# Patient Record
Sex: Female | Born: 1985
Health system: Southern US, Community
[De-identification: ages and names within clinical notes are randomized; demographics above are authoritative.]

## PROBLEM LIST (undated history)

## (undated) DIAGNOSIS — L709 Acne, unspecified: Secondary | ICD-10-CM

## (undated) DIAGNOSIS — E785 Hyperlipidemia, unspecified: Secondary | ICD-10-CM

## (undated) DIAGNOSIS — Z87440 Personal history of urinary (tract) infections: Secondary | ICD-10-CM

## (undated) DIAGNOSIS — Z8619 Personal history of other infectious and parasitic diseases: Secondary | ICD-10-CM

## (undated) DIAGNOSIS — M545 Low back pain: Secondary | ICD-10-CM

## (undated) DIAGNOSIS — T7840XA Allergy, unspecified, initial encounter: Secondary | ICD-10-CM

## (undated) DIAGNOSIS — Z6841 Body Mass Index (BMI) 40.0 and over, adult: Secondary | ICD-10-CM

## (undated) HISTORY — DX: Personal history of urinary (tract) infections: Z87.440

## (undated) HISTORY — DX: Hyperlipidemia, unspecified: E78.5

## (undated) HISTORY — DX: Body Mass Index (BMI) 40.0 and over, adult: Z684

## (undated) HISTORY — DX: Morbid (severe) obesity due to excess calories: E66.01

## (undated) HISTORY — DX: Personal history of other infectious and parasitic diseases: Z86.19

## (undated) HISTORY — PX: ADENOIDECTOMY: SUR15

## (undated) HISTORY — DX: Low back pain: M54.5

## (undated) HISTORY — DX: Acne, unspecified: L70.9

## (undated) HISTORY — DX: Allergy, unspecified, initial encounter: T78.40XA

---

## 2015-01-20 DIAGNOSIS — M545 Low back pain, unspecified: Secondary | ICD-10-CM

## 2015-01-20 HISTORY — DX: Low back pain, unspecified: M54.50

## 2016-08-12 ENCOUNTER — Ambulatory Visit (INDEPENDENT_AMBULATORY_CARE_PROVIDER_SITE_OTHER): Payer: BLUE CROSS/BLUE SHIELD | Admitting: Family

## 2016-08-12 ENCOUNTER — Encounter: Payer: Self-pay | Admitting: Family

## 2016-08-12 ENCOUNTER — Telehealth: Payer: Self-pay | Admitting: Family

## 2016-08-12 VITALS — BP 137/77 | HR 88 | Temp 98.5°F | Resp 16 | Ht 65.0 in | Wt 266.6 lb

## 2016-08-12 DIAGNOSIS — Z0000 Encounter for general adult medical examination without abnormal findings: Secondary | ICD-10-CM | POA: Diagnosis not present

## 2016-08-12 DIAGNOSIS — Z6841 Body Mass Index (BMI) 40.0 and over, adult: Secondary | ICD-10-CM | POA: Diagnosis not present

## 2016-08-12 LAB — URINALYSIS, ROUTINE W REFLEX MICROSCOPIC
Bilirubin Urine: NEGATIVE
Ketones, ur: NEGATIVE
Nitrite: POSITIVE — AB
PH: 5.5 (ref 5.0–8.0)
Specific Gravity, Urine: 1.025 (ref 1.000–1.030)
TOTAL PROTEIN, URINE-UPE24: NEGATIVE
URINE GLUCOSE: NEGATIVE
UROBILINOGEN UA: 0.2 (ref 0.0–1.0)

## 2016-08-12 LAB — BASIC METABOLIC PANEL
BUN: 15 mg/dL (ref 6–23)
CALCIUM: 9.6 mg/dL (ref 8.4–10.5)
CO2: 27 meq/L (ref 19–32)
CREATININE: 0.77 mg/dL (ref 0.40–1.20)
Chloride: 107 mEq/L (ref 96–112)
GFR: 92.84 mL/min (ref >60.00)
Glucose, Bld: 91 mg/dL (ref 70–99)
Potassium: 3.9 mEq/L (ref 3.5–5.1)
Sodium: 139 mEq/L (ref 135–145)

## 2016-08-12 LAB — LIPID PANEL
CHOL/HDL RATIO: 4
Cholesterol: 210 mg/dL — ABNORMAL HIGH (ref 0–200)
HDL: 51.7 mg/dL (ref >39.00)
LDL CALC: 123 mg/dL — AB (ref 0–99)
NONHDL: 158.66
TRIGLYCERIDES: 179 mg/dL — AB (ref 0.0–149.0)
VLDL: 35.8 mg/dL (ref 0.0–40.0)

## 2016-08-12 LAB — CBC WITH DIFFERENTIAL/PLATELET
BASOS ABS: 0 10*3/uL (ref 0.0–0.1)
Basophils Relative: 0.3 % (ref 0.0–3.0)
EOS ABS: 0.1 10*3/uL (ref 0.0–0.7)
Eosinophils Relative: 1.7 % (ref 0.0–5.0)
HEMATOCRIT: 41.6 % (ref 36.0–46.0)
Hemoglobin: 13.6 g/dL (ref 12.0–15.0)
LYMPHS PCT: 39 % (ref 12.0–46.0)
Lymphs Abs: 2.2 10*3/uL (ref 0.7–4.0)
MCHC: 32.6 g/dL (ref 30.0–36.0)
MCV: 92.2 fl (ref 78.0–100.0)
MONO ABS: 0.6 10*3/uL (ref 0.1–1.0)
Monocytes Relative: 9.6 % (ref 3.0–12.0)
NEUTROS ABS: 2.8 10*3/uL (ref 1.4–7.7)
Neutrophils Relative %: 49.4 % (ref 43.0–77.0)
PLATELETS: 260 10*3/uL (ref 150.0–400.0)
RBC: 4.51 Mil/uL (ref 3.87–5.11)
RDW: 13.4 % (ref 11.5–15.5)
WBC: 5.7 10*3/uL (ref 4.0–10.5)

## 2016-08-12 LAB — HEPATIC FUNCTION PANEL
ALK PHOS: 58 U/L (ref 39–117)
ALT: 32 U/L (ref 0–35)
AST: 23 U/L (ref 0–37)
Albumin: 4 g/dL (ref 3.5–5.2)
BILIRUBIN DIRECT: 0.1 mg/dL (ref 0.0–0.3)
BILIRUBIN TOTAL: 0.3 mg/dL (ref 0.2–1.2)
TOTAL PROTEIN: 7 g/dL (ref 6.0–8.3)

## 2016-08-12 LAB — TSH: TSH: 2.86 u[IU]/mL (ref 0.35–4.50)

## 2016-08-12 MED ORDER — NITROFURANTOIN MONOHYD MACRO 100 MG PO CAPS: 100.0000 mg | ORAL_CAPSULE | Freq: Two times a day (BID) | ORAL | 0 refills | Status: DC

## 2016-08-12 NOTE — Telephone Encounter (Signed)
Please contact patient and let her know that blood work looks good. With the exception of mild elevation of her triglycerides and cholesterol. She should work on low-fat low-cholesterol diet and avoid concentrated sweets.  Urinalysis does suggest urinary tract infection. I recommend Macrobid twice daily for 5 days. Repeat urinalysis with micro-in 2 weeks.

## 2016-08-12 NOTE — Progress Notes (Signed)
Subjective:    Patient ID: Haley Washington, female    DOB: 03-28-1985, 31 y.o.   MRN: [id number]  HPI  Haley Washington is a 31 yr old female who presents today to establish care.   Immunizations:  Reports that her last tetanaus was 6-7 years ago.  Diet: tries to eat healthy, does cook a t home a lot.  Exercise:  Not currently exercising Pap: up to date 6/18, normal per patient Dental: up to date Vision: due   Review of Systems  Constitutional: Negative for unexpected weight change.  HENT: Negative for hearing loss and rhinorrhea.   Eyes: Negative for visual disturbance.  Cardiovascular:       Occasional LE edema with prolonged sitting/standing  Gastrointestinal: Negative for blood in stool, constipation and diarrhea.       Does have some lactose intolerance  Genitourinary: Negative for dysuria and frequency.       Reports irregular menses- she is seeing GYN, reports that she has cysts on her ovaries, which were resolved on follow up. Told that she has not been diagnosed with PCOS  Musculoskeletal: Negative for myalgias.       Had some back pain last year, did PT and symptoms resolved  Neurological: Negative for headaches.  Hematological: Negative for adenopathy.  Psychiatric/Behavioral:       Denies depression/anxiety   Past Medical History:  Diagnosis Date  . Allergy   . History of chicken pox   . Hx: UTI (urinary tract infection)   . Hyperlipidemia      Social History   Social History  . Marital status: Married    Spouse name: N/A  . Number of children: N/A  . Years of education: N/A   Occupational History  . Not on file.   Social History Main Topics  . Smoking status: Never Smoker  . Smokeless tobacco: Never Used  . Alcohol use Yes     Comment: rarely  . Drug use: No  . Sexual activity: Not on file   Other Topics Concern  . Not on file   Social History Narrative  . No narrative on file    Past Surgical History:  Procedure Laterality Date  .  TONSILLECTOMY AND ADENOIDECTOMY      Family History  Problem Relation Age of Onset  . Hyperlipidemia Mother   . Hypertension Mother   . Alcohol abuse Father   . Mental illness Maternal Grandmother   . Arthritis Maternal Grandfather   . Cancer Maternal Grandfather   . Diabetes Maternal Grandfather   . Heart disease Paternal Grandfather   . Cancer Paternal Grandfather     Not on File  No current outpatient prescriptions on file prior to visit.   No current facility-administered medications on file prior to visit.     BP 137/77 (BP Location: Right Arm, Cuff Size: Large)   Pulse 88   Temp 98.5 F (36.9 C) (Oral)   Resp 16   Ht 5\' 5"  (1.651 m)   Wt 266 lb 9.6 oz (120.9 kg)   LMP 08/02/2016   SpO2 99%   BMI 44.36 kg/m       Objective:   Physical Exam Physical Exam  Constitutional: She is oriented to person, place, and time. She appears well-developed and well-nourished. No distress.  HENT:  Head: Normocephalic and atraumatic.  Right Ear: Tympanic membrane and ear canal normal.  Left Ear: Tympanic membrane and ear canal normal.  Mouth/Throat: Oropharynx is clear and moist.  Eyes: Pupils  are equal, round, and reactive to light. No scleral icterus.  Neck: Normal range of motion. No thyromegaly present.  Cardiovascular: Normal rate and regular rhythm.   No murmur heard. Pulmonary/Chest: Effort normal and breath sounds normal. No respiratory distress. He has no wheezes. She has no rales. She exhibits no tenderness.  Abdominal: Soft. Bowel sounds are normal. She exhibits no distension and no mass. There is no tenderness. There is no rebound and no guarding.  Musculoskeletal: She exhibits no edema.  Lymphadenopathy:    She has no cervical adenopathy.  Neurological: She is alert and oriented to person, place, and time. She has diminished patellar reflexes (difficult to elicit due to habitus). She exhibits normal muscle tone. Coordination normal.  Skin: Skin is warm and dry.    Psychiatric: She has a normal mood and affect. Her behavior is normal. Judgment and thought content normal.  Breast/pelvic: deferred to GYN        Assessment & Plan:   Preventative care- Pap, immunizations up to date. She will try to get a copy of her last tetanus shot.  Obtain routine lab work.  We discussed importance of healthy diet, exercise and weight loss. Specifically using myfitness pal app for calorie counting and goal weight loss of 1 pound a week plus adding 30 minutes of walking 5 days a week. Discussed goal weight of 150 pounds.        Assessment & Plan:

## 2016-08-12 NOTE — Patient Instructions (Addendum)
Please schedule an routine eye exam.   Complete lab work prior to leaving. Work on counting calories using my fitness pal with a goal weight loss of 1 pound per week.  Try to add 30 minutes of exercise 5 days as week.  Welcome to [facility]!

## 2016-08-13 NOTE — Telephone Encounter (Signed)
Tried to contact pt to give lab results and provider recommendations. No answer, left detailed message.

## 2016-08-24 ENCOUNTER — Telehealth: Payer: Self-pay | Admitting: Family

## 2016-08-24 NOTE — Telephone Encounter (Signed)
Complete

## 2016-08-25 ENCOUNTER — Ambulatory Visit (INDEPENDENT_AMBULATORY_CARE_PROVIDER_SITE_OTHER): Payer: BLUE CROSS/BLUE SHIELD | Admitting: Allergy and Immunology

## 2016-08-25 VITALS — BP 120/78 | HR 76 | Resp 16 | Ht 66.0 in | Wt 267.4 lb

## 2016-08-25 DIAGNOSIS — J3089 Other allergic rhinitis: Secondary | ICD-10-CM

## 2016-08-25 MED ORDER — MOMETASONE FUROATE 50 MCG/ACT NA SUSP: 1.0000 | Freq: Every day | NASAL | 11 refills | Status: DC

## 2016-08-25 NOTE — Patient Instructions (Addendum)
  1. Allergen avoidance measures - dust mite, pollen  2. Continue Nasonex/mometasone 1-2 sprays each nostril 3-7 times a week. Aim for least amount that maintains good control of disease date  3. Continue OTC antihistamine if needed  4. Do not restart immunotherapy at this point in time  5. Obtain fall flu vaccine  6. Create a lifelong plan for healthy living  7. Return to clinic in 1 year or earlier if problem

## 2016-08-25 NOTE — Progress Notes (Signed)
NEW PATIENT NOTE  Referring Provider: No ref. provider found Primary Provider: Sandford Craze, NP Date of office visit: 08/25/2016    Subjective:   Chief Complaint:  Haley Washington (DOB: [birth year]) is a 31 y.o. female who presents to the clinic on 08/25/2016 with a chief complaint of New Patient (Initial Visit) .  HPI: Haley Washington presents this clinic in evaluation of allergic disease.  She has a long history of allergic rhinitis with nasal congestion and sneezing and occasional itchy eye issues for which she underwent immunotherapy for approximately 1-1/2-2 years while living in [place]. She had a good response to this form of therapy and while consistently using Nasonex and cetirizine she believes that she has this condition under very good control. She moved from the [place] area to the [place] area this past March. She went through the springtime without much difficulty while utilizing her medical plan.  Past Medical History:  Diagnosis Date  . Acne    has been on aldactone in the past.  Also tried Accutane without improvement in her symptoms   . Allergy   . History of chicken pox   . Hx: UTI (urinary tract infection)   . Hyperlipidemia   . Low back pain 2017   improved with physical therapy  . Morbid obesity with BMI of 40.0-44.9, adult [facility])     Past Surgical History:  Procedure Laterality Date  . ADENOIDECTOMY      Allergies as of 08/25/2016   Not on File     Medication List      cetirizine 10 MG tablet Commonly known as:  ZYRTEC Take 10 mg by mouth daily.   FISH OIL PO Take 1 capsule by mouth 2 (two) times daily.   FLAXSEED (LINSEED) PO Take 1 tablet by mouth daily.   GLUCOSAMINE-CHONDROITIN PO Take 1 tablet by mouth 2 (two) times daily.   nitrofurantoin (macrocrystal-monohydrate) 100 MG capsule Commonly known as:  MACROBID TK 1 C PO BID   PRENATAL VITAMIN PO Take 1 tablet by mouth daily.   PROBIOTIC DAILY PO Take 1 capsule by mouth  daily.       Review of systems negative except as noted in HPI / PMHx or noted below:  Review of Systems  Constitutional: Negative.   HENT: Negative.   Eyes: Negative.   Respiratory: Negative.   Cardiovascular: Negative.   Gastrointestinal: Negative.   Genitourinary: Negative.   Musculoskeletal: Negative.   Skin: Negative.   Neurological: Negative.   Endo/Heme/Allergies: Negative.   Psychiatric/Behavioral: Negative.     Family History  Problem Relation Age of Onset  . Hyperlipidemia Mother   . Hypertension Mother   . Obesity Mother   . Alcohol abuse Father        recurring  . Obesity Brother   . Mental illness Maternal Grandmother        anxiety  . Arthritis Maternal Grandfather   . Cancer Maternal Grandfather        colon, diagnosed in 59's  . Diabetes Maternal Grandfather   . Heart disease Paternal Grandfather   . Cancer Paternal Grandfather        lymphoma, ? lung CA- smoker    Social History   Social History  . Marital status: Married    Spouse name: N/A  . Number of children: N/A  . Years of education: N/A   Occupational History  . Not on file.   Social History Main Topics  . Smoking status: Never Smoker  .  Smokeless tobacco: Never Used  . Alcohol use Yes     Comment: rarely  . Drug use: No  . Sexual activity: Yes    Partners: Male    Birth control/ protection: None   Other Topics Concern  . 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  Objective:   Vitals:   08/26/16 0819  BP: 120/78  Pulse: 76  Resp: 16   Height: 5\' 6"  (167.6  cm) Weight: 267 lb 6.4 oz (121.3 kg)  Physical Exam  Constitutional: She is well-developed, well-nourished, and in no distress.  HENT:  Head: Normocephalic. Head is without right periorbital erythema and without left periorbital erythema.  Right Ear: Tympanic membrane, external ear and ear canal normal.  Left Ear: External ear and ear canal normal. Tympanic membrane is scarred.  Nose: Nose normal. No mucosal edema or rhinorrhea.  Mouth/Throat: Uvula is midline, oropharynx is clear and moist and mucous membranes are normal. No oropharyngeal exudate.  Eyes: Pupils are equal, round, and reactive to light. Conjunctivae and lids are normal.  Neck: Trachea normal. No tracheal tenderness present. No tracheal deviation present. No thyromegaly present.  Cardiovascular: Normal rate, regular rhythm, S1 normal, S2 normal and normal heart sounds.   No murmur heard. Pulmonary/Chest: Effort normal and breath sounds normal. No stridor. No tachypnea. No respiratory distress. She has no wheezes. She has no rales. She exhibits no tenderness.  Abdominal: Soft. She exhibits no distension and no mass. There is no hepatosplenomegaly. There is no tenderness. There is no rebound and no guarding.  Musculoskeletal: She exhibits no edema or tenderness.  Lymphadenopathy:       Head (right side): No tonsillar adenopathy present.       Head (left side): No tonsillar adenopathy present.    She has no cervical adenopathy.    She has no axillary adenopathy.  Neurological: She is alert. Gait normal.  Skin: No rash noted. She is not diaphoretic. No erythema. No pallor. Nails show no clubbing.  Psychiatric: Mood and affect normal.    Diagnostics: Allergy skin tests were not performed.   Assessment and Plan:    1. Other allergic rhinitis     1. Allergen avoidance measures - dust mite, pollen  2. Continue Nasonex/mometasone 1-2 sprays each nostril 3-7 times a week. Aim for least amount that maintains good control of  disease date  3. Continue OTC antihistamine if needed  4. Do not restart immunotherapy at this point in time  5. Obtain fall flu vaccine  6. Create a lifelong plan for healthy living  7. Return to clinic in 1 year or earlier if problem  Baxter HireKristen appears to be doing relatively well on her current plan and at this point we will assume that she has received benefit from her immunotherapy administered while living in [place] and hopefully this will be permanent benefit. For now she will continue to use a nasal steroid aiming for the least amount required to control her disease state and antihistamines if needed and we will see how things  go over the course of the next year regarding control of her atopic disease. I did have a discussion with her today about her weight and plans for a healthy lifestyle.   Laurette Schimke, MD Allergy / Immunology [facility]

## 2016-08-26 ENCOUNTER — Other Ambulatory Visit (INDEPENDENT_AMBULATORY_CARE_PROVIDER_SITE_OTHER): Payer: BLUE CROSS/BLUE SHIELD

## 2016-08-26 ENCOUNTER — Encounter: Payer: Self-pay | Admitting: Allergy and Immunology

## 2016-08-26 ENCOUNTER — Other Ambulatory Visit: Payer: BLUE CROSS/BLUE SHIELD

## 2016-08-26 DIAGNOSIS — R8290 Unspecified abnormal findings in urine: Secondary | ICD-10-CM

## 2016-08-26 LAB — URINALYSIS WITH CULTURE, IF INDICATED
BILIRUBIN URINE: NEGATIVE
Ketones, ur: NEGATIVE
Leukocytes, UA: NEGATIVE
Nitrite: NEGATIVE
Specific Gravity, Urine: 1.025 (ref 1.000–1.030)
TOTAL PROTEIN, URINE-UPE24: NEGATIVE
UROBILINOGEN UA: 0.2 (ref 0.0–1.0)
Urine Glucose: NEGATIVE
pH: 6 (ref 5.0–8.0)

## 2016-08-27 LAB — URINE CULTURE

## 2016-10-26 ENCOUNTER — Encounter (HOSPITAL_BASED_OUTPATIENT_CLINIC_OR_DEPARTMENT_OTHER): Payer: Self-pay | Admitting: Emergency Medicine

## 2016-10-26 ENCOUNTER — Emergency Department (HOSPITAL_BASED_OUTPATIENT_CLINIC_OR_DEPARTMENT_OTHER): Payer: BLUE CROSS/BLUE SHIELD

## 2016-10-26 ENCOUNTER — Ambulatory Visit (HOSPITAL_BASED_OUTPATIENT_CLINIC_OR_DEPARTMENT_OTHER)
Admission: EM | Admit: 2016-10-26 | Discharge: 2016-10-27 | Disposition: A | Discharge status: Home / Self Care | Payer: BLUE CROSS/BLUE SHIELD | Source: Ambulatory Visit | Attending: Obstetrics & Gynecology | Admitting: Obstetrics & Gynecology

## 2016-10-26 DIAGNOSIS — O034 Incomplete spontaneous abortion without complication: Secondary | ICD-10-CM | POA: Insufficient documentation

## 2016-10-26 DIAGNOSIS — Z7951 Long term (current) use of inhaled steroids: Secondary | ICD-10-CM | POA: Insufficient documentation

## 2016-10-26 DIAGNOSIS — Z6841 Body Mass Index (BMI) 40.0 and over, adult: Secondary | ICD-10-CM | POA: Diagnosis not present

## 2016-10-26 DIAGNOSIS — Z79899 Other long term (current) drug therapy: Secondary | ICD-10-CM | POA: Diagnosis not present

## 2016-10-26 DIAGNOSIS — L709 Acne, unspecified: Secondary | ICD-10-CM | POA: Insufficient documentation

## 2016-10-26 DIAGNOSIS — O021 Missed abortion: Secondary | ICD-10-CM | POA: Diagnosis present

## 2016-10-26 LAB — COMPREHENSIVE METABOLIC PANEL
ALBUMIN: 4.1 g/dL (ref 3.5–5.0)
ALT: 27 U/L (ref 14–54)
AST: 35 U/L (ref 15–41)
Alkaline Phosphatase: 68 U/L (ref 38–126)
Anion gap: 8 (ref 5–15)
BUN: 13 mg/dL (ref 6–20)
CHLORIDE: 106 mmol/L (ref 101–111)
CO2: 22 mmol/L (ref 22–32)
CREATININE: 0.79 mg/dL (ref 0.44–1.00)
Calcium: 9 mg/dL (ref 8.9–10.3)
GFR calc Af Amer: >60 mL/min (ref >60)
GFR calc non Af Amer: >60 mL/min (ref >60)
GLUCOSE: 128 mg/dL — AB (ref 65–99)
POTASSIUM: 3.6 mmol/L (ref 3.5–5.1)
SODIUM: 136 mmol/L (ref 135–145)
Total Bilirubin: 0.5 mg/dL (ref 0.3–1.2)
Total Protein: 7.5 g/dL (ref 6.5–8.1)

## 2016-10-26 LAB — CBC WITH DIFFERENTIAL/PLATELET
Basophils Absolute: 0 10*3/uL (ref 0.0–0.1)
Basophils Relative: 0 %
EOS PCT: 1 %
Eosinophils Absolute: 0.1 10*3/uL (ref 0.0–0.7)
HEMATOCRIT: 37.3 % (ref 36.0–46.0)
Hemoglobin: 12.7 g/dL (ref 12.0–15.0)
LYMPHS ABS: 1.8 10*3/uL (ref 0.7–4.0)
LYMPHS PCT: 17 %
MCH: 30.3 pg (ref 26.0–34.0)
MCHC: 34 g/dL (ref 30.0–36.0)
MCV: 89 fL (ref 78.0–100.0)
MONO ABS: 0.7 10*3/uL (ref 0.1–1.0)
Monocytes Relative: 6 %
NEUTROS ABS: 7.9 10*3/uL — AB (ref 1.7–7.7)
Neutrophils Relative %: 76 %
PLATELETS: 231 10*3/uL (ref 150–400)
RBC: 4.19 MIL/uL (ref 3.87–5.11)
RDW: 13.6 % (ref 11.5–15.5)
WBC: 10.3 10*3/uL (ref 4.0–10.5)

## 2016-10-26 LAB — HCG, QUANTITATIVE, PREGNANCY: hCG, Beta Chain, Quant, S: 4911 m[IU]/mL — ABNORMAL HIGH (ref <5)

## 2016-10-26 LAB — PROTIME-INR
INR: 0.95
Prothrombin Time: 12.6 seconds (ref 11.4–15.2)

## 2016-10-26 LAB — WET PREP, GENITAL
SPERM: NONE SEEN
Trich, Wet Prep: NONE SEEN
Yeast Wet Prep HPF POC: NONE SEEN

## 2016-10-26 LAB — ABO/RH: ABO/RH(D): O POS

## 2016-10-26 LAB — APTT: APTT: 26 s (ref 24–36)

## 2016-10-26 MED ORDER — DOXYCYCLINE HYCLATE 100 MG IV SOLR
INTRAVENOUS | Status: AC
  Filled 2016-10-26: qty 100

## 2016-10-26 MED ORDER — DOXYCYCLINE HYCLATE 100 MG IV SOLR
100.0000 mg | Freq: Once | INTRAVENOUS | Status: AC
  Administered 2016-10-26: 100 mg via INTRAVENOUS
  Filled 2016-10-26: qty 100

## 2016-10-26 MED ORDER — SODIUM CHLORIDE 0.9 % IV BOLUS (SEPSIS)
1000.0000 mL | Freq: Once | INTRAVENOUS | Status: AC
  Administered 2016-10-26: 1000 mL via INTRAVENOUS

## 2016-10-26 MED ORDER — MORPHINE SULFATE (PF) 2 MG/ML IV SOLN
2.0000 mg | Freq: Once | INTRAVENOUS | Status: AC
  Administered 2016-10-26: 2 mg via INTRAVENOUS
  Filled 2016-10-26: qty 1

## 2016-10-26 NOTE — ED Triage Notes (Signed)
Patient states that she is on her period. Reports that she has had it heavier than usual this month, but today she started to bleed down her legs and through her skirt. She denies ever having this before

## 2016-10-26 NOTE — ED Provider Notes (Signed)
[facility] Provider Note   CSN: [id number] Arrival date & time: 10/26/16  1547     History   Chief Complaint Chief Complaint  Patient presents with  . Vaginal Bleeding    HPI Haley Washington is a 31 y.o. female.  HPI   Haley Washington is a 31 y.o. female, with a history of obesity, presenting to the ED with increased vaginal bleeding today.   Started her menstrual cycle on Oct 4. It was heavier than normal. Today she had increased bleeding today. She uses a DivaCup. She filled this, soaked through a heavy pad, and started bleeding down her legs. She also began having significant abdominal cramping which is unusual for her. Cramping is suprapubic, 10/10, constant, nonradiating. States she last emptied the DivaCup and changed her pad around 2pm today. She has not had this problem before.   She has a cycle every month, but is slightly irregular. She spots in between.   Denies chest pain, shortness of breath, N/V/D, vaginal discharge prior to bleeding, local trauma, dizziness, change in medications, or any other complaints. Has never been pregnant. No hormonal therapy. Last food intake around 2pm today.  OBGYN: Dr. Henderson Cloud   Past Medical History:  Diagnosis Date  . Acne    has been on aldactone in the past.  Also tried Accutane without improvement in her symptoms   . Allergy   . History of chicken pox   . Hx: UTI (urinary tract infection)   . Hyperlipidemia   . Low back pain 2017   improved with physical therapy  . Morbid obesity with BMI of 40.0-44.9, adult [facility])     Patient Active Problem List   Diagnosis Date Noted  . Morbid obesity with BMI of 40.0-44.9, adult [facility])     Past Surgical History:  Procedure Laterality Date  . ADENOIDECTOMY      OB History    No data available       Home Medications    Prior to Admission medications   Medication Sig Start Date End Date Taking? Authorizing Provider  cetirizine (ZYRTEC) 10 MG tablet Take 10 mg by  mouth daily.    [provider]  FLAXSEED, LINSEED, PO Take 1 tablet by mouth daily.    [provider]  GLUCOSAMINE-CHONDROITIN PO Take 1 tablet by mouth 2 (two) times daily.    [provider]  mometasone (NASONEX) 50 MCG/ACT nasal spray Place 1-2 sprays into the nose daily. 08/25/16   Kozlow, Alvira Philips, MD  nitrofurantoin, macrocrystal-monohydrate, (MACROBID) 100 MG capsule TK 1 C PO BID 08/12/16   [provider]  Omega-3 Fatty Acids (FISH OIL PO) Take 1 capsule by mouth 2 (two) times daily.    [provider]  Prenatal Vit-Fe Fumarate-FA (PRENATAL VITAMIN PO) Take 1 tablet by mouth daily.    [provider]  Probiotic Product (PROBIOTIC DAILY PO) Take 1 capsule by mouth daily.    [provider]    Family History Family History  Problem Relation Age of Onset  . Hyperlipidemia Mother   . Hypertension Mother   . Obesity Mother   . Alcohol abuse Father        recurring  . Obesity Brother   . Mental illness Maternal Grandmother        anxiety  . Arthritis Maternal Grandfather   . Cancer Maternal Grandfather        colon, diagnosed in 40's  . Diabetes Maternal Grandfather   . Heart  disease Paternal Grandfather   . Cancer Paternal Grandfather        lymphoma, ? lung CA- smoker    Social History Social History  Substance Use Topics  . Smoking status: Never Smoker  . Smokeless tobacco: Never Used  . Alcohol use Yes     Comment: rarely     Allergies   Patient has no known allergies.   Review of Systems Review of Systems  Constitutional: Negative for chills, diaphoresis and fever.  Respiratory: Negative for shortness of breath.   Cardiovascular: Negative for chest pain.  Gastrointestinal: Positive for abdominal pain, diarrhea, nausea and vomiting.  Genitourinary: Positive for vaginal bleeding. Negative for dysuria.  Musculoskeletal: Negative for back pain.  Neurological: Negative for dizziness, syncope, weakness  and light-headedness.  All other systems reviewed and are negative.    Physical Exam Updated Vital Signs BP (!) 112/99 (BP Location: Left Arm)   Pulse (!) 101   Temp 98 F (36.7 C) (Oral)   Resp 18   Ht  (1.626 m)   Wt 117.9 kg (260 lb)   LMP 10/26/2016   SpO2 100%   BMI 44.63 kg/m   Physical Exam  Constitutional: She appears well-developed and well-nourished. No distress.  HENT:  Head: Normocephalic and atraumatic.  Eyes: Conjunctivae are normal.  Neck: Neck supple.  Cardiovascular: Normal rate, regular rhythm, normal heart sounds and intact distal pulses.   Pulmonary/Chest: Effort normal and breath sounds normal. No respiratory distress.  Abdominal: Soft. Bowel sounds are normal. There is tenderness in the suprapubic area. There is no guarding.  Genitourinary:  Genitourinary Comments: External genitalia abnormal - Covered in blood - some fresh, some dried, and some in clots about 5x2 cm Vagina with discharge - Large amount of dark red blood in vaginal vault. About 100cc suctioned from the vaginal vault. Multiple large clots multiple large clots with total measurement approximately 10 cm x 5 cm x 2 cm Cervix  - cervical os not able to be completely visualized, however, dark red blood appears to be flowing from the cervical os and the cervical os appears to be open with clots remaining in the cervical os. negative for cervical motion tenderness Adnexa palpated, no masses, negative for tenderness noted Bladder palpated negative for tenderness Uterus palpated no masses, positive for tenderness  No inguinal lymphadenopathy. Otherwise normal female genitalia. RN, Diane, served as Biomedical engineer during exam.  Musculoskeletal: She exhibits no edema.  Lymphadenopathy:    She has no cervical adenopathy.  Neurological: She is alert.  Skin: Skin is warm and dry. She is not diaphoretic.  Psychiatric: She has a normal mood and affect. Her behavior is normal.  Nursing note and vitals  reviewed.    ED Treatments / Results  Labs (all labs ordered are listed, but only abnormal results are displayed) Labs Reviewed  WET PREP, GENITAL - Abnormal; Notable for the following:       Result Value   Clue Cells Wet Prep HPF POC PRESENT (*)    WBC, Wet Prep HPF POC FEW (*)    All other components within normal limits  COMPREHENSIVE METABOLIC PANEL - Abnormal; Notable for the following:    Glucose, Bld 128 (*)    All other components within normal limits  HCG, QUANTITATIVE, PREGNANCY - Abnormal; Notable for the following:    hCG, Beta Chain, Quant, S 4,911 (*)    All other components within normal limits  CBC WITH DIFFERENTIAL/PLATELET - Abnormal; Notable for the following:  Neutro Abs 7.9 (*)    All other components within normal limits  PROTIME-INR  APTT  CBC WITH DIFFERENTIAL/PLATELET  RPR  HIV ANTIBODY (ROUTINE TESTING)  ABO/RH  GC/CHLAMYDIA PROBE AMP ([place]) NOT AT [facility]    EKG  EKG Interpretation None       Radiology US Ob Comp < 14 Wks  Result Date: 10/26/2016 CLINICAL DATA:  Heavy vaginal bleeding for 2 days, pregnant, quantitative beta HCG = 4911 EXAM: OBSTETRIC <14 WK [place] AND TRANSVAGINAL OB US TECHNIQUE: Both transabdominal and transvaginal ultrasound examinations were performed for complete evaluation of the gestation as well as the maternal uterus, adnexal regions, and pelvic cul-de-sac. Transvaginal technique was performed to assess early pregnancy. COMPARISON:  None FINDINGS: Intrauterine gestational sac: Single gestational sac present, elongated, abnormal in position at the lower uterine segment Yolk sac:  Not visualized Embryo:  Questionably visualized Cardiac Activity: Absent Heart Rate: N/A  bpm MSD: 21.3  mm   7 w   0  d Subchorionic hemorrhage:  None visualized. Maternal uterus/adnexae: Large amount of heterogeneous material distends the endometrial canal at the upper to mid uterine segments question hemorrhage. LEFT ovary normal size and  morphology, 3.1 x 2.9 x 3.8 cm. RIGHT ovary normal size and morphology, 1.2 x 3.0 x 1.5 cm. No free pelvic fluid or adnexal masses. IMPRESSION: Abnormal appearing gestational sac within the uterus questionably containing a fetal pole but no fetal cardiac activity is identified. Gestational sac is abnormal in morphology, elongated and located in an abnormal position at the lower uterine segment, cannot exclude spontaneous abortion in progress. Heterogeneous material distends the endometrial canal at the upper to mid uterine segments question hemorrhage. Electronically Signed   By: Ulyses Southward M.D.   On: 10/26/2016 21:30   US Ob Transvaginal  Result Date: 10/26/2016 CLINICAL DATA:  Heavy vaginal bleeding for 2 days, pregnant, quantitative beta HCG = 4911 EXAM: OBSTETRIC <14 WK [place] AND TRANSVAGINAL OB US TECHNIQUE: Both transabdominal and transvaginal ultrasound examinations were performed for complete evaluation of the gestation as well as the maternal uterus, adnexal regions, and pelvic cul-de-sac. Transvaginal technique was performed to assess early pregnancy. COMPARISON:  None FINDINGS: Intrauterine gestational sac: Single gestational sac present, elongated, abnormal in position at the lower uterine segment Yolk sac:  Not visualized Embryo:  Questionably visualized Cardiac Activity: Absent Heart Rate: N/A  bpm MSD: 21.3  mm   7 w   0  d Subchorionic hemorrhage:  None visualized. Maternal uterus/adnexae: Large amount of heterogeneous material distends the endometrial canal at the upper to mid uterine segments question hemorrhage. LEFT ovary normal size and morphology, 3.1 x 2.9 x 3.8 cm. RIGHT ovary normal size and morphology, 1.2 x 3.0 x 1.5 cm. No free pelvic fluid or adnexal masses. IMPRESSION: Abnormal appearing gestational sac within the uterus questionably containing a fetal pole but no fetal cardiac activity is identified. Gestational sac is abnormal in morphology, elongated and located in an abnormal  position at the lower uterine segment, cannot exclude spontaneous abortion in progress. Heterogeneous material distends the endometrial canal at the upper to mid uterine segments question hemorrhage. Electronically Signed   By: Ulyses Southward M.D.   On: 10/26/2016 21:30    Procedures Pelvic exam Date/Time: 10/26/2016 5:25 PM Performed by: Anselm Pancoast Authorized by: Harolyn Rutherford C  Consent: Verbal consent obtained. Risks and benefits: risks, benefits and alternatives were discussed Consent given by: patient Local anesthesia used: no  Anesthesia: Local anesthesia used: no  Sedation: Patient  sedated: no Patient tolerance: Patient tolerated the procedure well with no immediate complications    (including critical care time)  Medications Ordered in ED Medications  doxycycline (VIBRAMYCIN) 100 mg in dextrose 5 % 250 mL IVPB (not administered)  doxycycline (VIBRAMYCIN) 100 MG injection (not administered)  sodium chloride 0.9 % bolus 1,000 mL (0 mLs Intravenous Stopped 10/26/16 1930)  morphine 2 MG/ML injection 2 mg (2 mg Intravenous Given 10/26/16 1810)     Initial Impression / Assessment and Plan / ED Course  I have reviewed the triage vital signs and the nursing notes.  Pertinent labs & imaging results that were available during my care of the patient were reviewed by me and considered in my medical decision making (see chart for details).  Clinical Course as of Oct 27 2235  [facility] Oct 26, 2016  1610 Spoke with the patient about the positive pregnancy test and the possibility for miscarriage vs ectopic. Patient and husband voice understanding. All questions answered. Bleeding seems to have slowed.   [SJ]  2210 Spoke with Dr. Mora Appl, OBGYN on call for [facility]. Requests that we transfer patient to Maternity admissions unit at [facility]. Also requests Doxycycline  IV.   [SJ]  2220 Discussed US findings, plan for transfer, and D&C. Patient agrees to this plan of care. All  questions answered.   [SJ]    Clinical Course User Index [SJ] Joy, Shawn C, PA-C    Patient presents with vaginal bleeding. Positive pregnancy test. Suspicion for spontaneous miscarriage. Ultrasound findings consistent with spontaneous miscarriage. No sign of ectopic pregnancy. Patient to be transferred to [facility].   Findings and plan of care discussed with Frederick Peers, MD.    Vitals:   10/26/16 1552 10/26/16 1556 10/26/16 1805 10/26/16 2114  BP:  (!) 112/99 (!) 137/91 116/76  Pulse:  (!) 101 84 85  Resp:  Temp:  98 F (36.7 C)    TempSrc:  Oral    SpO2:  100% 98% 98%  Weight: 117.9 kg (260 lb)     Height:  (1.626 m)        Final Clinical Impressions(s) / ED Diagnoses   Final diagnoses:  Spontaneous abortion    New Prescriptions New Prescriptions   No medications on file     Concepcion Living 10/26/16 2237    Little, Ambrose Finland, MD 10/28/16 [phone]

## 2016-10-27 ENCOUNTER — Inpatient Hospital Stay (HOSPITAL_COMMUNITY): Payer: BLUE CROSS/BLUE SHIELD | Admitting: Anesthesiology

## 2016-10-27 ENCOUNTER — Encounter (HOSPITAL_COMMUNITY)
Admission: EM | Disposition: A | Discharge status: Home / Self Care | Payer: Self-pay | Source: Ambulatory Visit | Attending: Emergency Medicine

## 2016-10-27 ENCOUNTER — Encounter (HOSPITAL_COMMUNITY): Payer: Self-pay | Admitting: *Deleted

## 2016-10-27 DIAGNOSIS — O021 Missed abortion: Secondary | ICD-10-CM | POA: Diagnosis present

## 2016-10-27 HISTORY — PX: DILATION AND EVACUATION: SHX1459

## 2016-10-27 LAB — HIV ANTIBODY (ROUTINE TESTING W REFLEX): HIV SCREEN 4TH GENERATION: NONREACTIVE

## 2016-10-27 LAB — GC/CHLAMYDIA PROBE AMP (~~LOC~~) NOT AT ARMC
Chlamydia: NEGATIVE
Neisseria Gonorrhea: NEGATIVE

## 2016-10-27 LAB — RPR: RPR Ser Ql: NONREACTIVE

## 2016-10-27 SURGERY — DILATION AND EVACUATION, UTERUS
Anesthesia: General | Site: Vagina | Wound class: Clean Contaminated

## 2016-10-27 SURGERY — DILATION AND EVACUATION, UTERUS
Anesthesia: General

## 2016-10-27 MED ORDER — FENTANYL CITRATE (PF) 100 MCG/2ML IJ SOLN: 25.0000 ug | INTRAMUSCULAR | Status: DC | PRN

## 2016-10-27 MED ORDER — MORPHINE SULFATE (PF) 4 MG/ML IV SOLN
2.0000 mg | INTRAVENOUS | Status: DC | PRN
  Administered 2016-10-27 (×3): 2 mg via INTRAVENOUS
  Filled 2016-10-27 (×3): qty 1

## 2016-10-27 MED ORDER — FENTANYL CITRATE (PF) 100 MCG/2ML IJ SOLN
INTRAMUSCULAR | Status: DC | PRN
  Administered 2016-10-27 (×2): 50 ug via INTRAVENOUS

## 2016-10-27 MED ORDER — MIDAZOLAM HCL 2 MG/2ML IJ SOLN
INTRAMUSCULAR | Status: DC | PRN
  Administered 2016-10-27: 2 mg via INTRAVENOUS

## 2016-10-27 MED ORDER — LIDOCAINE HCL 1 % IJ SOLN
INTRAMUSCULAR | Status: AC
  Filled 2016-10-27: qty 20

## 2016-10-27 MED ORDER — DEXAMETHASONE SODIUM PHOSPHATE 4 MG/ML IJ SOLN
INTRAMUSCULAR | Status: AC
  Filled 2016-10-27: qty 1

## 2016-10-27 MED ORDER — LACTATED RINGERS IV SOLN
INTRAVENOUS | Status: DC | PRN
  Administered 2016-10-27 (×2): via INTRAVENOUS

## 2016-10-27 MED ORDER — LIDOCAINE HCL (CARDIAC) 20 MG/ML IV SOLN
INTRAVENOUS | Status: DC | PRN
  Administered 2016-10-27: 30 mg via INTRAVENOUS

## 2016-10-27 MED ORDER — FENTANYL CITRATE (PF) 100 MCG/2ML IJ SOLN
INTRAMUSCULAR | Status: AC
  Filled 2016-10-27: qty 2

## 2016-10-27 MED ORDER — ONDANSETRON HCL 4 MG/2ML IJ SOLN
INTRAMUSCULAR | Status: AC
  Filled 2016-10-27: qty 2

## 2016-10-27 MED ORDER — KETOROLAC TROMETHAMINE 30 MG/ML IJ SOLN
INTRAMUSCULAR | Status: DC | PRN
  Administered 2016-10-27: 30 mg via INTRAVENOUS

## 2016-10-27 MED ORDER — IBUPROFEN 600 MG PO TABS: 600.0000 mg | ORAL_TABLET | Freq: Four times a day (QID) | ORAL | 0 refills | Status: DC | PRN

## 2016-10-27 MED ORDER — PROMETHAZINE HCL 25 MG/ML IJ SOLN: 6.2500 mg | INTRAMUSCULAR | Status: DC | PRN

## 2016-10-27 MED ORDER — PROPOFOL 10 MG/ML IV BOLUS
INTRAVENOUS | Status: DC | PRN
  Administered 2016-10-27: 100 mg via INTRAVENOUS
  Administered 2016-10-27: 200 mg via INTRAVENOUS

## 2016-10-27 MED ORDER — DEXAMETHASONE SODIUM PHOSPHATE 10 MG/ML IJ SOLN
INTRAMUSCULAR | Status: DC | PRN
  Administered 2016-10-27: 4 mg via INTRAVENOUS

## 2016-10-27 MED ORDER — ONDANSETRON HCL 4 MG/2ML IJ SOLN
INTRAMUSCULAR | Status: DC | PRN
  Administered 2016-10-27: 4 mg via INTRAVENOUS

## 2016-10-27 MED ORDER — MISOPROSTOL 200 MCG PO TABS
800.0000 ug | ORAL_TABLET | Freq: Once | ORAL | Status: AC
  Administered 2016-10-27: 800 ug via BUCCAL
  Filled 2016-10-27: qty 4

## 2016-10-27 MED ORDER — MIDAZOLAM HCL 2 MG/2ML IJ SOLN
INTRAMUSCULAR | Status: AC
  Filled 2016-10-27: qty 2

## 2016-10-27 MED ORDER — HYDROCODONE-ACETAMINOPHEN 5-325 MG PO TABS: ORAL_TABLET | ORAL | 0 refills | Status: DC

## 2016-10-27 SURGICAL SUPPLY — 19 items
CATH ROBINSON RED A/P 16FR (CATHETERS) ×3 IMPLANT
CLOTH BEACON ORANGE TIMEOUT ST (SAFETY) ×3 IMPLANT
DECANTER SPIKE VIAL GLASS SM (MISCELLANEOUS) ×3 IMPLANT
DILATOR CANAL MILEX (MISCELLANEOUS) IMPLANT
GLOVE BIO SURGEON STRL SZ7 (GLOVE) ×3 IMPLANT
GLOVE BIOGEL PI IND STRL 7.0 (GLOVE) ×1 IMPLANT
GLOVE BIOGEL PI INDICATOR 7.0 (GLOVE) ×2
GOWN STRL REUS W/TWL LRG LVL3 (GOWN DISPOSABLE) ×6 IMPLANT
KIT BERKELEY 1ST TRIMESTER 3/8 (MISCELLANEOUS) ×3 IMPLANT
NS IRRIG 1000ML POUR BTL (IV SOLUTION) ×3 IMPLANT
PACK VAGINAL MINOR WOMEN LF (CUSTOM PROCEDURE TRAY) ×3 IMPLANT
PAD OB MATERNITY 4.3X12.25 (PERSONAL CARE ITEMS) ×3 IMPLANT
PAD PREP 24X48 CUFFED NSTRL (MISCELLANEOUS) ×3 IMPLANT
SET BERKELEY SUCTION TUBING (SUCTIONS) ×3 IMPLANT
TOWEL OR 17X24 6PK STRL BLUE (TOWEL DISPOSABLE) ×6 IMPLANT
VACURETTE 10 RIGID CVD (CANNULA) IMPLANT
VACURETTE 7MM CVD STRL WRAP (CANNULA) ×3 IMPLANT
VACURETTE 8 RIGID CVD (CANNULA) IMPLANT
VACURETTE 9 RIGID CVD (CANNULA) IMPLANT

## 2016-10-27 NOTE — Anesthesia Preprocedure Evaluation (Addendum)
Anesthesia Evaluation  Patient identified by MRN, date of birth, ID band Patient awake    Reviewed: Allergy & Precautions, NPO status , Patient's Chart, lab work & pertinent test results  Airway Mallampati: III  TM Distance: >3 FB Neck ROM: Full    Dental  (+) Dental Advisory Given   Pulmonary neg pulmonary ROS,    Pulmonary exam normal breath sounds clear to auscultation       Cardiovascular negative cardio ROS Normal cardiovascular exam Rhythm:Regular Rate:Normal     Neuro/Psych negative neurological ROS  negative psych ROS   GI/Hepatic negative GI ROS, Neg liver ROS,   Endo/Other  Morbid obesity  Renal/GU negative Renal ROS  negative genitourinary   Musculoskeletal Low back pain   Abdominal (+) + obese,   Peds  Hematology negative hematology ROS (+)   Anesthesia Other Findings   Reproductive/Obstetrics 7wk pregnancy w/ miscarriage                            Anesthesia Physical Anesthesia Plan  ASA: III  Anesthesia Plan: General   Post-op Pain Management:    Induction: Intravenous  PONV Risk Score and Plan: 3 and Ondansetron, Dexamethasone, Midazolam and Treatment may vary due to age or medical condition  Airway Management Planned: LMA  Additional Equipment: None  Intra-op Plan:   Post-operative Plan: Extubation in OR  Informed Consent: I have reviewed the patients History and Physical, chart, labs and discussed the procedure including the risks, benefits and alternatives for the proposed anesthesia with the patient or authorized representative who has indicated his/her understanding and acceptance.   Dental advisory given  Plan Discussed with: CRNA  Anesthesia Plan Comments:       Anesthesia Quick Evaluation

## 2016-10-27 NOTE — Anesthesia Postprocedure Evaluation (Signed)
Anesthesia Post Note  Patient: Haley Washington  Procedure(s) Performed: DILATATION AND EVACUATION (N/A Vagina )     Patient location during evaluation: PACU Anesthesia Type: General Level of consciousness: awake and alert Pain management: pain level controlled Vital Signs Assessment: post-procedure vital signs reviewed and stable Respiratory status: spontaneous breathing, nonlabored ventilation and respiratory function stable Cardiovascular status: blood pressure returned to baseline and stable Postop Assessment: no apparent nausea or vomiting Anesthetic complications: no    Last Vitals:  Vitals:   10/27/16 1100 10/27/16 1130  BP: 112/64   Pulse: 74 83  Resp: 16 17  Temp:  37.1 C  SpO2: 98% 96%    Last Pain:  Vitals:   10/27/16 0800  TempSrc: Oral  PainSc:    Pain Goal: Patients Stated Pain Goal: 3 (10/27/16 0217)               Beryle Lathe

## 2016-10-27 NOTE — MAU Note (Signed)
Informed pt about the medication Cytotec pt verbalizes understanding and has no further questions. Pt knows she has been transferred to our facility for a D&E procedure.

## 2016-10-27 NOTE — Discharge Instructions (Signed)
DISCHARGE INSTRUCTIONS: D&C / D&E The following instructions have been prepared to help you care for yourself upon your return home.   Personal hygiene:  Use sanitary pads for vaginal drainage, not tampons.  Shower the day after your procedure.  NO tub baths, pools or Jacuzzis for 2-3 weeks.  Wipe front to back after using the bathroom.  Activity and limitations:  Do NOT drive or operate any equipment for 24 hours. The effects of anesthesia are still present and drowsiness may result.  Do NOT rest in bed all day.  Walking is encouraged.  Walk up and down stairs slowly.  You may resume your normal activity in one to two days or as indicated by your physician.  Sexual activity: NO intercourse for at least 2 weeks after the procedure, or as indicated by your physician.  Diet: Eat a light meal as desired this evening. You may resume your usual diet tomorrow.  Return to work: You may resume your work activities in one to two days or as indicated by your doctor.  What to expect after your surgery: Expect to have vaginal bleeding/discharge for 2-3 days and spotting for up to 10 days. It is not unusual to have soreness for up to 1-2 weeks. You may have a slight burning sensation when you urinate for the first day. Mild cramps may continue for a couple of days. You may have a regular period in 2-6 weeks.  Call your doctor for any of the following:  Excessive vaginal bleeding, saturating and changing one pad every hour.  Inability to urinate 6 hours after discharge from hospital.  Pain not relieved by pain medication.  Fever of 100.4 F or greater.  Unusual vaginal discharge or odor.   Call for an appointment:    Patients signature: ______________________  Nurses signature ________________________  Support person's signature_______________________      Post Anesthesia Home Care Instructions  Activity: Get plenty of rest for the remainder of the day. A responsible  individual must stay with you for 24 hours following the procedure.  For the next 24 hours, DO NOT: -Drive a car -Advertising copywriter -Drink alcoholic beverages -Take any medication unless instructed by your physician -Make any legal decisions or sign important papers.  Meals: Start with liquid foods such as gelatin or soup. Progress to regular foods as tolerated. Avoid greasy, spicy, heavy foods. If nausea and/or vomiting occur, drink only clear liquids until the nausea and/or vomiting subsides. Call your physician if vomiting continues.  Special Instructions/Symptoms: Your throat may feel dry or sore from the anesthesia or the breathing tube placed in your throat during surgery. If this causes discomfort, gargle with warm salt water. The discomfort should disappear within 24 hours.  If you had a scopolamine patch placed behind your ear for the management of post- operative nausea and/or vomiting:  1. The medication in the patch is effective for 72 hours, after which it should be removed.  Wrap patch in a tissue and discard in the trash. Wash hands thoroughly with soap and water. 2. You may remove the patch earlier than 72 hours if you experience unpleasant side effects which may include dry mouth, dizziness or visual disturbances. 3. Avoid touching the patch. Wash your hands with soap and water after contact with the patch.    NO IBUPROFEN PRODUCTS (MOTRIN, ADVIL) OR ALEVE UNTIL 4:15PM TODAY.

## 2016-10-27 NOTE — H&P (Signed)
Haley Washington is an 31 y.o. female.  31 yo G1P0 @ 9+1 by LMP and 7+1 by [place] [facility] for vaginal bleeding and incomplete miscarriage. The patient was unaware that she was pregnant. She began experiencing heavy bleeding yesterday afternoon and presented to the ER for eval. The diagnosis of impending miscarriage was made. She was transferred to [facility] for further management. Management option were discussed and the patient elects surgical management. R/B/A of the procedure were discussed and the patient wishes to proceed.    Patient's last menstrual period was 10/22/2016.    Past Medical History:  Diagnosis Date  . Acne    has been on aldactone in the past.  Also tried Accutane without improvement in her symptoms   . Allergy   . History of chicken pox   . Hx: UTI (urinary tract infection)   . Hyperlipidemia   . Low back pain 2017   improved with physical therapy  . Morbid obesity with BMI of 40.0-44.9, adult [facility])     Past Surgical History:  Procedure Laterality Date  . ADENOIDECTOMY      Family History  Problem Relation Age of Onset  . Hyperlipidemia Mother   . Hypertension Mother   . Obesity Mother   . Alcohol abuse Father        recurring  . Obesity Brother   . Mental illness Maternal Grandmother        anxiety  . Arthritis Maternal Grandfather   . Cancer Maternal Grandfather        colon, diagnosed in 10's  . Diabetes Maternal Grandfather   . Heart disease Paternal Grandfather   . Cancer Paternal Grandfather        lymphoma, ? lung CA- smoker    Social History:  reports that she has never smoked. She has never used smokeless tobacco. She reports that she drinks alcohol. She reports that she does not use drugs.  Allergies:  Allergies  Allergen Reactions  . Lactose Intolerance (Gi) Anaphylaxis    Prescriptions Prior to Admission  Medication Sig Dispense Refill Last Dose  . cetirizine (ZYRTEC) 10 MG tablet Take 10 mg by mouth  daily.   10/26/2016 at Unknown time  . FLAXSEED, LINSEED, PO Take 1 tablet by mouth daily.   10/26/2016 at Unknown time  . GLUCOSAMINE-CHONDROITIN PO Take 1 tablet by mouth 2 (two) times daily.   10/26/2016 at Unknown time  . mometasone (NASONEX) 50 MCG/ACT nasal spray Place 1-2 sprays into the nose daily. 17 g 11 10/26/2016 at Unknown time  . Prenatal Vit-Fe Fumarate-FA (PRENATAL VITAMIN PO) Take 1 tablet by mouth daily.   10/26/2016 at Unknown time  . Probiotic Product (PROBIOTIC DAILY PO) Take 1 capsule by mouth daily.   10/26/2016 at Unknown time  . ampicillin (PRINCIPEN) 500 MG capsule Take 1 capsule by mouth daily.  3   . Clindamycin-Benzoyl Per, Refr, gel APPLY UTD TO SPOTS UTD  1   . FINACEA 15 % FOAM APP EXT AA QD  2   . nitrofurantoin, macrocrystal-monohydrate, (MACROBID) 100 MG capsule TK 1 C PO BID  0 Taking    ROS  Blood pressure (!) 111/54, pulse 68, temperature 98.6 F (37 C), temperature source Oral, resp. rate 18, height  (1.626 m), weight 117.9 kg (260 lb), last menstrual period 10/22/2016, SpO2 100 %. Physical Exam  Results for orders placed or performed during the hospital encounter of 10/26/16 (from the past 24 hour(s))  Wet  prep, genital     Status: Abnormal   Collection Time: 10/26/16  5:30 PM  Result Value Ref Range   Yeast Wet Prep HPF POC NONE SEEN NONE SEEN   Trich, Wet Prep NONE SEEN NONE SEEN   Clue Cells Wet Prep HPF POC PRESENT (A) NONE SEEN   WBC, Wet Prep HPF POC FEW (A) NONE SEEN   Sperm NONE SEEN   Protime-INR     Status: None   Collection Time: 10/26/16  5:45 PM  Result Value Ref Range   Prothrombin Time 12.6 11.4 - 15.2 seconds   INR 0.95   APTT     Status: None   Collection Time: 10/26/16  5:45 PM  Result Value Ref Range   aPTT 26 24 - 36 seconds  RPR     Status: None   Collection Time: 10/26/16  5:45 PM  Result Value Ref Range   RPR Ser Ql Non Reactive Non Reactive  Comprehensive metabolic panel     Status: Abnormal   Collection Time:  10/26/16  5:45 PM  Result Value Ref Range   Sodium 136 135 - 145 mmol/L   Potassium 3.6 3.5 - 5.1 mmol/L   Chloride 106 101 - 111 mmol/L   CO2 22 22 - 32 mmol/L   Glucose, Bld 128 (H) 65 - 99 mg/dL   BUN 13 6 - 20 mg/dL   Creatinine, Ser 1.61 0.44 - 1.00 mg/dL   Calcium 9.0 8.9 - 09.6 mg/dL   Total Protein 7.5 6.5 - 8.1 g/dL   Albumin 4.1 3.5 - 5.0 g/dL   AST 35 15 - 41 U/L   ALT 27 14 - 54 U/L   Alkaline Phosphatase 68 38 - 126 U/L   Total Bilirubin 0.5 0.3 - 1.2 mg/dL   GFR calc non Af Amer >60 >60 mL/min   GFR calc Af Amer >60 >60 mL/min   Anion gap 8 5 - 15  hCG, quantitative, pregnancy     Status: Abnormal   Collection Time: 10/26/16  5:45 PM  Result Value Ref Range   hCG, Beta Chain, Quant, S 4,911 (H) <5 mIU/mL  CBC with Differential     Status: Abnormal   Collection Time: 10/26/16  5:45 PM  Result Value Ref Range   WBC 10.3 4.0 - 10.5 K/uL   RBC 4.19 3.87 - 5.11 MIL/uL   Hemoglobin 12.7 12.0 - 15.0 g/dL   HCT 04.5 40.9 - 81.1 %   MCV 89.0 78.0 - 100.0 fL   MCH 30.3 26.0 - 34.0 pg   MCHC 34.0 30.0 - 36.0 g/dL   RDW 91.4 78.2 - 95.6 %   Platelets 231 150 - 400 K/uL   Neutrophils Relative % 76 %   Neutro Abs 7.9 (H) 1.7 - 7.7 K/uL   Lymphocytes Relative 17 %   Lymphs Abs 1.8 0.7 - 4.0 K/uL   Monocytes Relative 6 %   Monocytes Absolute 0.7 0.1 - 1.0 K/uL   Eosinophils Relative 1 %   Eosinophils Absolute 0.1 0.0 - 0.7 K/uL   Basophils Relative 0 %   Basophils Absolute 0.0 0.0 - 0.1 K/uL  ABO/Rh     Status: None   Collection Time: 10/26/16  8:31 PM  Result Value Ref Range   ABO/RH(D) O POS    No rh immune globuloin      NOT A RH IMMUNE GLOBULIN CANDIDATE, PT RH POSITIVE Performed at [facility], [address], [place], [place] [address]  US Ob Comp < 14 Wks  Result Date: 10/26/2016 CLINICAL DATA:  Heavy vaginal bleeding for 2 days, pregnant, quantitative beta HCG = 4911 EXAM: OBSTETRIC <14 WK [place] AND TRANSVAGINAL OB US TECHNIQUE: Both  transabdominal and transvaginal ultrasound examinations were performed for complete evaluation of the gestation as well as the maternal uterus, adnexal regions, and pelvic cul-de-sac. Transvaginal technique was performed to assess early pregnancy. COMPARISON:  None FINDINGS: Intrauterine gestational sac: Single gestational sac present, elongated, abnormal in position at the lower uterine segment Yolk sac:  Not visualized Embryo:  Questionably visualized Cardiac Activity: Absent Heart Rate: N/A  bpm MSD: 21.3  mm   7 w   0  d Subchorionic hemorrhage:  None visualized. Maternal uterus/adnexae: Large amount of heterogeneous material distends the endometrial canal at the upper to mid uterine segments question hemorrhage. LEFT ovary normal size and morphology, 3.1 x 2.9 x 3.8 cm. RIGHT ovary normal size and morphology, 1.2 x 3.0 x 1.5 cm. No free pelvic fluid or adnexal masses. IMPRESSION: Abnormal appearing gestational sac within the uterus questionably containing a fetal pole but no fetal cardiac activity is identified. Gestational sac is abnormal in morphology, elongated and located in an abnormal position at the lower uterine segment, cannot exclude spontaneous abortion in progress. Heterogeneous material distends the endometrial canal at the upper to mid uterine segments question hemorrhage. Electronically Signed   By: Ulyses Southward M.D.   On: 10/26/2016 21:30   US Ob Transvaginal  Result Date: 10/26/2016 CLINICAL DATA:  Heavy vaginal bleeding for 2 days, pregnant, quantitative beta HCG = 4911 EXAM: OBSTETRIC <14 WK [place] AND TRANSVAGINAL OB US TECHNIQUE: Both transabdominal and transvaginal ultrasound examinations were performed for complete evaluation of the gestation as well as the maternal uterus, adnexal regions, and pelvic cul-de-sac. Transvaginal technique was performed to assess early pregnancy. COMPARISON:  None FINDINGS: Intrauterine gestational sac: Single gestational sac present, elongated, abnormal in  position at the lower uterine segment Yolk sac:  Not visualized Embryo:  Questionably visualized Cardiac Activity: Absent Heart Rate: N/A  bpm MSD: 21.3  mm   7 w   0  d Subchorionic hemorrhage:  None visualized. Maternal uterus/adnexae: Large amount of heterogeneous material distends the endometrial canal at the upper to mid uterine segments question hemorrhage. LEFT ovary normal size and morphology, 3.1 x 2.9 x 3.8 cm. RIGHT ovary normal size and morphology, 1.2 x 3.0 x 1.5 cm. No free pelvic fluid or adnexal masses. IMPRESSION: Abnormal appearing gestational sac within the uterus questionably containing a fetal pole but no fetal cardiac activity is identified. Gestational sac is abnormal in morphology, elongated and located in an abnormal position at the lower uterine segment, cannot exclude spontaneous abortion in progress. Heterogeneous material distends the endometrial canal at the upper to mid uterine segments question hemorrhage. Electronically Signed   By: Ulyses Southward M.D.   On: 10/26/2016 21:30    Assessment/Plan: 1) 23 hr obs 2) Proceed with suction D&E, informed consent obtained 3) SCDs   Nixie Laube H. 10/27/2016, 8:51 AM

## 2016-10-27 NOTE — MAU Provider Note (Signed)
History     CSN: [id number]  Arrival date and time: 10/26/16 1547   First Provider Initiated Contact with Patient 10/27/16 0010      Chief Complaint  Patient presents with  . Vaginal Bleeding   HPI  Haley Washington is a 31 y.o. G1P0 at [redacted]w[redacted]d gestation brought to MAU via [facility] from [facility] where she was seen for heavy vaginal bleeding and miscarriage.  This is an unplanned pregnancy, but she and her spouse were not using BC to avoid pregnancy. She was not aware of her pregnancy before being seen today.  She thought her regular period started Thursday 10/4.  She noted that she started having heavy vaginal bleeding today around 1400.  She went to [facility] around 1500.  She had an ultrasound while at [facility] that showed an irregularly shaped IUGS in the LUS with a ?embryo and no cardiac activity. She is a patient of [facility] where she sees Dr. Henderson Cloud. Dr. Mora Appl requested for her to be sent her for evaluation by MAU provider and possible surgery.  Past Medical History:  Diagnosis Date  . Acne    has been on aldactone in the past.  Also tried Accutane without improvement in her symptoms   . Allergy   . History of chicken pox   . Hx: UTI (urinary tract infection)   . Hyperlipidemia   . Low back pain 2017   improved with physical therapy  . Morbid obesity with BMI of 40.0-44.9, adult [facility])     Past Surgical History:  Procedure Laterality Date  . ADENOIDECTOMY      Family History  Problem Relation Age of Onset  . Hyperlipidemia Mother   . Hypertension Mother   . Obesity Mother   . Alcohol abuse Father        recurring  . Obesity Brother   . Mental illness Maternal Grandmother        anxiety  . Arthritis Maternal Grandfather   . Cancer Maternal Grandfather        colon, diagnosed in 45's  . Diabetes Maternal Grandfather   . Heart disease Paternal Grandfather   . Cancer Paternal Grandfather        lymphoma, ? lung CA- smoker    Social  History  Substance Use Topics  . Smoking status: Never Smoker  . Smokeless tobacco: Never Used  . Alcohol use Yes     Comment: rarely    Allergies: No Known Allergies  Prescriptions Prior to Admission  Medication Sig Dispense Refill Last Dose  . cetirizine (ZYRTEC) 10 MG tablet Take 10 mg by mouth daily.   10/26/2016 at Unknown time  . FLAXSEED, LINSEED, PO Take 1 tablet by mouth daily.   10/26/2016 at Unknown time  . GLUCOSAMINE-CHONDROITIN PO Take 1 tablet by mouth 2 (two) times daily.   10/26/2016 at Unknown time  . mometasone (NASONEX) 50 MCG/ACT nasal spray Place 1-2 sprays into the nose daily. 17 g 11 10/26/2016 at Unknown time  . Omega-3 Fatty Acids (FISH OIL PO) Take 1 capsule by mouth 2 (two) times daily.   10/26/2016 at Unknown time  . Prenatal Vit-Fe Fumarate-FA (PRENATAL VITAMIN PO) Take 1 tablet by mouth daily.   10/26/2016 at Unknown time  . Probiotic Product (PROBIOTIC DAILY PO) Take 1 capsule by mouth daily.   10/26/2016 at Unknown time  . nitrofurantoin, macrocrystal-monohydrate, (MACROBID) 100 MG capsule TK 1 C PO BID  0 Taking    Review  of Systems  Constitutional: Negative.   HENT: Negative.   Eyes: Negative.   Respiratory: Negative.   Cardiovascular: Negative.   Gastrointestinal: Negative.  Abdominal pain: none since receiving pain medication at [facility].  Endocrine: Negative.   Genitourinary: Positive for vaginal bleeding (heavy with clots since 1400).  Musculoskeletal: Negative.   Skin: Negative.   Allergic/Immunologic: Negative.   Neurological: Negative.   Hematological: Negative.   Psychiatric/Behavioral: Negative.    Physical Exam   Blood pressure 123/73, pulse 68, temperature 98 F (36.7 C), temperature source Oral, resp. rate 17, height  (1.626 m), weight 117.9 kg (260 lb), last menstrual period 10/22/2016, SpO2 97 %.  Physical Exam  Nursing note and vitals reviewed. Constitutional: She is oriented to person, place, and time. She appears  well-developed and well-nourished.  HENT:  Head: Normocephalic.  Eyes: Pupils are equal, round, and reactive to light.  Neck: Normal range of motion.  Cardiovascular: Normal rate, regular rhythm and normal heart sounds.   Respiratory: Effort normal and breath sounds normal.  GI: Soft. Bowel sounds are normal.  Genitourinary:  Genitourinary Comments: Uterus: difficult to assess size d/t body habitus, non-tender, cx: smooth, pink, no lesions, moderate amt of dark, red blood with small clots -- removed with 4-5 large cotton swabs, small clots or tissue in cervical os - unable to remove with large cotton swab (too small to remove with ring forcep), closed/long/soft, no CMT or friability, no adnexal tenderness   Musculoskeletal: Normal range of motion.  Neurological: She is alert and oriented to person, place, and time.  Skin: Skin is warm and dry.  Psychiatric: She has a normal mood and affect. Her behavior is normal. Judgment and thought content normal.    MAU Course  Procedures  MDM Pelvic exam Cytotec 800 mcg buccally Morphine 2 mg every 2 hrs prn pain  *Consult with Dr. Mora Appl @ 0030 - notified of patient's complaints, assessments, lab & U/S results - orders to give Cytotec and surgery will be in the morning @ 0700, if surgical schedule permits  Assessment and Plan  Missed abortion - Place in observation on [facility] unit - Prep for OR per scheduled time of ~0900 - Call Dr. Mora Appl for further orders   Raelyn Mora, MSN, CNM 10/27/2016, 12:20 AM

## 2016-10-27 NOTE — Anesthesia Procedure Notes (Signed)
Procedure Name: LMA Insertion Date/Time: 10/27/2016 9:58 AM Performed by: Beryle Lathe Pre-anesthesia Checklist: Patient identified, Timeout performed, Emergency Drugs available, Patient being monitored and Suction available Patient Re-evaluated:Patient Re-evaluated prior to induction Oxygen Delivery Method: Circle system utilized Preoxygenation: Pre-oxygenation with 100% oxygen Induction Type: IV induction Ventilation: Mask ventilation without difficulty LMA: LMA inserted LMA Size: 4.0 Number of attempts: 1 Dental Injury: Teeth and Oropharynx as per pre-operative assessment

## 2016-10-27 NOTE — Op Note (Signed)
Pre-Operative Diagnosis: 1) 7 week incomplete miscarriage Postoperative Diagnosis: 1) 7 week incomplete miscarriage Procedure: Suction Dilation and evacuation Surgeon: Dr. Waynard Reeds Assistant: None Operative Findings: Anterior cervix, Cervix dilated to 1 cm with blood and clot in vaginal vault. POCs at level of cervical os.  Specimen: POCs EBL: Total I/O In: 200 [I.V.:200] Out: 550 [Urine:400; Blood:150]   Haley Washington Is a 31 year old gravida 1 para 0 who presents for definitive surgical management for incomplete miscarriage. Please see the patient's history and physical for complete details of the history. Management options were discussed with the patient. R/B/A reviewed. Following appropriate informed consent was taken to the operating room. The patient was appropriately identified during a time out procedure. General LMA anesthesia was administered and the patient was placed in the dorsal lithotomy position. The patient was prepped and draped in the normal sterile fashion. A speculum was placed into the vagina, a single-tooth tenaculum was placed on the anterior lip of the cervix, and 15 cc of 1% lidocaine was administered in a paracervical fashion. Cervical dilation was not required.  A #7 suction curet was then passed to the fundus, the vacuum was engaged, and 3 suction passes were performed with a curette. A Sharp curettage was performed and a gritty texture was noted. A final suction pass was performed with minimal results. This completed the procedure. The patient tolerated the procedure well was brought to the recovery room in stable condition for the procedure. All sponge and needle counts correct.

## 2016-10-27 NOTE — Transfer of Care (Signed)
Immediate Anesthesia Transfer of Care Note  Patient: Haley Washington  Procedure(s) Performed: DILATATION AND EVACUATION (N/A Vagina )  Patient Location: PACU  Anesthesia Type:General  Level of Consciousness: awake, alert , oriented and patient cooperative  Airway & Oxygen Therapy: Patient Spontanous Breathing and Patient connected to nasal cannula oxygen  Post-op Assessment: Report given to RN, Post -op Vital signs reviewed and stable and Patient moving all extremities X 4  Post vital signs: Reviewed and stable  Last Vitals:  Vitals:   10/27/16 0217 10/27/16 0800  BP: 135/74 (!) 111/54  Pulse: 75 68  Resp: 18 18  Temp: 36.8 C 37 C  SpO2: 100% 100%    Last Pain:  Vitals:   10/27/16 0800  TempSrc: Oral  PainSc:       Patients Stated Pain Goal: 3 (10/27/16 0217)  Complications: No apparent anesthesia complications

## 2016-12-25 ENCOUNTER — Ambulatory Visit: Payer: BLUE CROSS/BLUE SHIELD | Admitting: Family

## 2016-12-25 ENCOUNTER — Encounter: Payer: Self-pay | Admitting: Family

## 2016-12-25 ENCOUNTER — Ambulatory Visit (HOSPITAL_BASED_OUTPATIENT_CLINIC_OR_DEPARTMENT_OTHER)
Admission: RE | Admit: 2016-12-25 | Discharge: 2016-12-25 | Disposition: A | Discharge status: Home / Self Care | Payer: BLUE CROSS/BLUE SHIELD | Source: Ambulatory Visit | Attending: Family | Admitting: Family

## 2016-12-25 VITALS — BP 134/70 | HR 77 | Temp 98.0°F | Resp 16 | Ht 65.0 in | Wt 271.0 lb

## 2016-12-25 DIAGNOSIS — T1490XA Injury, unspecified, initial encounter: Secondary | ICD-10-CM | POA: Insufficient documentation

## 2016-12-25 DIAGNOSIS — M25531 Pain in right wrist: Secondary | ICD-10-CM

## 2016-12-25 DIAGNOSIS — Z23 Encounter for immunization: Secondary | ICD-10-CM | POA: Diagnosis not present

## 2016-12-25 DIAGNOSIS — M25561 Pain in right knee: Secondary | ICD-10-CM

## 2016-12-25 DIAGNOSIS — W19XXXA Unspecified fall, initial encounter: Secondary | ICD-10-CM | POA: Insufficient documentation

## 2016-12-25 NOTE — Patient Instructions (Signed)
Please complete x rays on the first floor. You will be contacted about your referral to sports medicine.  Have a Happy Holiday!

## 2016-12-25 NOTE — Progress Notes (Signed)
Subjective:    Patient ID: Haley RiegerKristen E Espindola, female    DOB: 04-Mar-1985, 31 y.o.   MRN: [id number]  HPI    Patient is a 8131 presents today with 2 concerns:  1.  Knee pain/hand pain-she reports right knee pain following a fall which occurred on 1113.  She currently fell off a Art gallery managerelectric scooter. She landed on the right palm and the right knee.  Reports right knee swelling.  Only really hurts if she kneels or presses too hard on it.  She does have some mild hand pain when she "puts too much pressure on it."    Review of Systems See HPI  Past Medical History:  Diagnosis Date  . Acne    has been on aldactone in the past.  Also tried Accutane without improvement in her symptoms   . Allergy   . History of chicken pox   . Hx: UTI (urinary tract infection)   . Hyperlipidemia   . Low back pain 2017   improved with physical therapy  . Morbid obesity with BMI of 40.0-44.9, adult [facility]      Social History   Socioeconomic History  . Marital status: Married    Spouse name: Not on file  . Number of children: Not on file  . Years of education: Not on file  . Highest education level: Not on file  Social Needs  . Financial resource strain: Not on file  . Food insecurity - worry: Not on file  . Food insecurity - inability: Not on file  . Transportation needs - medical: Not on file  . Transportation needs - non-medical: Not on file  Occupational History  . Not on file  Tobacco Use  . Smoking status: Never Smoker  . Smokeless tobacco: Never Used  Substance and Sexual Activity  . Alcohol use: Yes    Comment: rarely  . Drug use: No  . Sexual activity: Yes    Partners: Male    Birth control/protection: None  Other Topics Concern  . 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 ADENOIDECTOMY     . DILATION AND EVACUATION N/A 10/27/2016   Procedure: DILATATION AND EVACUATION;  Surgeon: Waynard Reedsoss, Kendra, MD;  [facility];  Service: Gynecology;  Laterality: N/A;    Family History  Problem Relation Age of Onset  . Hyperlipidemia Mother   . Hypertension Mother   . Obesity Mother   . Alcohol abuse Father        recurring  . Obesity Brother   . Mental illness Maternal Grandmother        anxiety  . Arthritis Maternal Grandfather   . Cancer Maternal Grandfather        colon, diagnosed in 2080's  . Diabetes Maternal Grandfather   . Heart disease Paternal Grandfather   . Cancer Paternal Grandfather        lymphoma, ? lung CA- smoker    Allergies  Allergen Reactions  . Lactose Intolerance (Gi) Other (See Comments)    GI upset    Current Outpatient Medications on File Prior to Visit  Medication Sig Dispense Refill  . ampicillin (PRINCIPEN) 500 MG capsule Take 1 capsule by mouth daily. Take with food- using for acne  3  . cetirizine (  ZYRTEC) 10 MG tablet Take 10 mg by mouth daily.    . Clindamycin-Benzoyl Per, Refr, gel APPLY UTD TO SPOTS UTD  1  . FINACEA 15 % FOAM APP EXT AA QD  2  . FLAXSEED, LINSEED, PO Take 1 tablet by mouth daily.    [place] Kitchen. GLUCOSAMINE-CHONDROITIN PO Take 2 tablets by mouth daily.     [place] Kitchen. ibuprofen (ADVIL,MOTRIN) 200 MG tablet Take 200 mg by mouth every 6 (six) hours as needed for mild pain or cramping.    [place] Kitchen. ibuprofen (ADVIL,MOTRIN) 600 MG tablet Take 1 tablet (600 mg total) by mouth every 6 (six) hours as needed. 90 tablet 0  . mometasone (NASONEX) 50 MCG/ACT nasal spray Place 1-2 sprays into the nose daily. 17 g 11  . Prenatal Vit-Fe Fumarate-FA (PRENATAL VITAMIN PO) Take 1 tablet by mouth daily.    . Probiotic Product (PROBIOTIC DAILY PO) Take 1 capsule by mouth daily.    [place] Kitchen. albuterol (PROVENTIL HFA;VENTOLIN HFA) 108 (90 Base) MCG/ACT inhaler Inhale into the lungs every 6 (six) hours as needed for wheezing or shortness of breath.    [place] Kitchen.  HYDROcodone-acetaminophen (NORCO/VICODIN) 5-325 MG tablet 1-2 tablets every 4-6 hours as needed for pain (Patient not taking: Reported on 12/25/2016) 12 tablet 0   No current facility-administered medications on file prior to visit.     BP 134/70 (BP Location: Right Arm, Patient Position: Sitting, Cuff Size: Large)   Pulse 77   Temp 98 F (36.7 C) (Oral)   Resp 16   Ht 5\' 5"  (1.651 m)   Wt 271 lb (122.9 kg)   LMP 11/29/2016   SpO2 100%   BMI 45.10 kg/m       Objective:   Physical Exam  Constitutional: She is oriented to person, place, and time. She appears well-developed and well-nourished.  Cardiovascular: Normal rate, regular rhythm and normal heart sounds.  No murmur heard. Pulmonary/Chest: Effort normal and breath sounds normal. No respiratory distress. She has no wheezes.  Musculoskeletal: She exhibits no edema.  Right knee effusion is noted.  Negative drawer sign.  No pain with flexion or extension.  No erythema or warmth  Neurological: She is alert and oriented to person, place, and time.  Skin: Skin is warm and dry.  Mild at base of right palm ecchymosis noted  Psychiatric: She has a normal mood and affect. Her behavior is normal. Judgment and thought content normal.          Assessment & Plan:  Right knee pain with effusion-will obtain knee x-ray for further evaluation and will refer to sports medicine for further evaluation.  Right hand pain- will obtain x-ray of right hand for further evaluation.  Tdap today

## 2016-12-26 ENCOUNTER — Encounter: Payer: Self-pay | Admitting: Family

## 2016-12-30 ENCOUNTER — Encounter: Payer: Self-pay | Admitting: Family Medicine

## 2016-12-30 ENCOUNTER — Ambulatory Visit: Payer: BLUE CROSS/BLUE SHIELD | Admitting: Family Medicine

## 2016-12-30 DIAGNOSIS — S8991XA Unspecified injury of right lower leg, initial encounter: Secondary | ICD-10-CM | POA: Diagnosis not present

## 2016-12-30 DIAGNOSIS — S6991XA Unspecified injury of right wrist, hand and finger(s), initial encounter: Secondary | ICD-10-CM | POA: Diagnosis not present

## 2016-12-30 NOTE — Assessment & Plan Note (Signed)
consistent with traumatic prepatellar bursitis confirmed with ultrasound.  Independently reviewed radiographs and no fracture.  Exam otherwise reassuring.  Icing, ibuprofen or aleve, compression sleeve, elevation.  Consider aspiration and injection if doesn't resolve with conservative measures.  F/u prn.

## 2016-12-30 NOTE — Progress Notes (Signed)
PCP and consultation requested by: Haley Washington, Melissa, NP  Subjective:   HPI: Patient is a 31 y.o. female here for Right knee, hand injuries.  Patient reports on 11/13 she was on a scooter in [place] at a conference. She accidentally fell onto right side injuring her right knee and FOOSH injury to right hand caused pain radial side of wrist/hand. She reports she has improved overall from both injuries though has anterior right knee swelling without pain though stiffness if kneeling on this. No catching, locking, giving out. Pain level 0/10 currently. She has some soreness in right hand at 2/10 level. She did some therapy earlier this year for problems with back going into her right leg. Had a lot of bruising of leg but no skin changes now.  No numbness in leg or hand.  Past Medical History:  Diagnosis Date  . Acne    has been on aldactone in the past.  Also tried Accutane without improvement in her symptoms   . Allergy   . History of chicken pox   . Hx: UTI (urinary tract infection)   . Hyperlipidemia   . Low back pain 2017   improved with physical therapy  . Morbid obesity with BMI of 40.0-44.9, adult [facility](HCC)     Current Outpatient Medications on File Prior to Visit  Medication Sig Dispense Refill  . albuterol (PROVENTIL HFA;VENTOLIN HFA) 108 (90 Base) MCG/ACT inhaler Inhale into the lungs every 6 (six) hours as needed for wheezing or shortness of breath.    Marland Kitchen. ampicillin (PRINCIPEN) 500 MG capsule Take 1 capsule by mouth daily. Take with food- using for acne  3  . cetirizine (ZYRTEC) 10 MG tablet Take 10 mg by mouth daily.    . Clindamycin-Benzoyl Per, Refr, gel APPLY UTD TO SPOTS UTD  1  . FINACEA 15 % FOAM APP EXT AA QD  2  . FLAXSEED, LINSEED, PO Take 1 tablet by mouth daily.    Marland Kitchen. GLUCOSAMINE-CHONDROITIN PO Take 2 tablets by mouth daily.     Marland Kitchen. HYDROcodone-acetaminophen (NORCO/VICODIN) 5-325 MG tablet 1-2 tablets every 4-6 hours as needed for pain (Patient not taking:  Reported on 12/25/2016) 12 tablet 0  . ibuprofen (ADVIL,MOTRIN) 200 MG tablet Take 200 mg by mouth every 6 (six) hours as needed for mild pain or cramping.    Marland Kitchen. ibuprofen (ADVIL,MOTRIN) 600 MG tablet Take 1 tablet (600 mg total) by mouth every 6 (six) hours as needed. 90 tablet 0  . mometasone (NASONEX) 50 MCG/ACT nasal spray Place 1-2 sprays into the nose daily. 17 g 11  . Prenatal Vit-Fe Fumarate-FA (PRENATAL VITAMIN PO) Take 1 tablet by mouth daily.    . Probiotic Product (PROBIOTIC DAILY PO) Take 1 capsule by mouth daily.     No current facility-administered medications on file prior to visit.     Past Surgical History:  Procedure Laterality Date  . ADENOIDECTOMY    . DILATION AND EVACUATION N/A 10/27/2016   Procedure: DILATATION AND EVACUATION;  Surgeon: Waynard Reedsoss, Kendra, MD;  [facility];  Service: Gynecology;  Laterality: N/A;    Allergies  Allergen Reactions  . Lactose Intolerance (Gi) Other (See Comments)    GI upset    Social History   Socioeconomic History  . Marital status: Married    Spouse name: Not on file  . Number of children: Not on file  . Years of education: Not on file  . Highest education level: Not on file  Social Needs  . Physicist, medicalinancial resource  strain: Not on file  . Food insecurity - worry: Not on file  . Food insecurity - inability: Not on file  . Transportation needs - medical: Not on file  . Transportation needs - non-medical: Not on file  Occupational History  . Not on file  Tobacco Use  . Smoking status: Never Smoker  . Smokeless tobacco: Never Used  Substance and Sexual Activity  . Alcohol use: Yes    Comment: rarely  . Drug use: No  . Sexual activity: Yes    Partners: Male    Birth control/protection: None  Other Topics Concern  . 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 Hyperlipidemia Mother   . Hypertension Mother   . Obesity Mother   . Alcohol abuse Father        recurring  . Obesity Brother   . Mental illness Maternal Grandmother        anxiety  . Arthritis Maternal Grandfather   . Cancer Maternal Grandfather        colon, diagnosed in 9280's  . Diabetes Maternal Grandfather   . Heart disease Paternal Grandfather   . Cancer Paternal Grandfather        lymphoma, ? lung CA- smoker    BP 102/72   Pulse 97   Ht 5\' 4"  (1.626 m)   Wt 270 lb (122.5 kg)   LMP 11/29/2016   BMI 46.35 kg/m   Review of Systems: See HPI above.     Objective:  Physical Exam:  Gen: NAD, comfortable in exam room  Right knee: Swelling of prepatellar bursa.  No effusion, other deformity, bruising. Mild TTP distal quad tendon, over prepatellar bursa.  No other tenderness. FROM with 5/5 strength. Negative ant/post drawers. Negative valgus/varus testing. Negative lachmanns. Negative mcmurrays, apleys, patellar apprehension. NV intact distally.  Left knee: No gross deformity, ecchymoses, swelling. No TTP. FROM with 5/5 strength. Negative ant/post drawers. Negative valgus/varus testing. NV intact distally.   Right hand/wrist: No gross deformity, swelling, bruising. Minimal TTP base of thenar eminence.  No other tenderness. FROM digits with 5/5 strength. Collateral ligaments intact of thumb. NVI distally.  Assessment & Plan:  1. Right knee injury - consistent with traumatic prepatellar bursitis confirmed with ultrasound.  Independently reviewed radiographs and no fracture.  Exam otherwise reassuring.  Icing, ibuprofen or aleve, compression sleeve, elevation.  Consider aspiration and injection if doesn't resolve with conservative measures.  F/u prn.  2. Right wrist injury - independently reviewed radiographs and no evidence fracture.  Improving clinically since injury.  Continue to monitor.  F/u prn.

## 2016-12-30 NOTE — Assessment & Plan Note (Signed)
independently reviewed radiographs and no evidence fracture.  Improving clinically since injury.  Continue to monitor.  F/u prn.

## 2016-12-30 NOTE — Patient Instructions (Signed)
You have prepatellar bursitis. Ice the area 3-4 times a day for 15 minutes at a time (or just as frequently as you can) Take Ibuprofen 600mg  three times a day OR aleve 2 tabs twice a day for swelling and inflammation - I'd probably take regularly for 1 month then as needed. Compression sleeve or ACE wrap during the day as often as possible. Consider aspiration and injection of the area if this does not improve as expected over the next couple months. Consider knee pad for protection though I'd just try to avoid kneeling on this knee. Follow up with me as needed.

## 2017-01-06 ENCOUNTER — Encounter (HOSPITAL_COMMUNITY): Payer: Self-pay | Admitting: Obstetrics and Gynecology

## 2017-01-10 ENCOUNTER — Other Ambulatory Visit: Payer: Self-pay

## 2017-01-10 ENCOUNTER — Encounter (HOSPITAL_COMMUNITY): Payer: Self-pay | Admitting: Emergency Medicine

## 2017-01-10 ENCOUNTER — Emergency Department (HOSPITAL_COMMUNITY)
Admission: EM | Admit: 2017-01-10 | Discharge: 2017-01-10 | Disposition: A | Discharge status: Home / Self Care | Payer: BLUE CROSS/BLUE SHIELD | Attending: Emergency Medicine | Admitting: Emergency Medicine

## 2017-01-10 DIAGNOSIS — Z3201 Encounter for pregnancy test, result positive: Secondary | ICD-10-CM | POA: Insufficient documentation

## 2017-01-10 DIAGNOSIS — O2341 Unspecified infection of urinary tract in pregnancy, first trimester: Secondary | ICD-10-CM | POA: Insufficient documentation

## 2017-01-10 DIAGNOSIS — N390 Urinary tract infection, site not specified: Secondary | ICD-10-CM

## 2017-01-10 DIAGNOSIS — Z79899 Other long term (current) drug therapy: Secondary | ICD-10-CM | POA: Diagnosis not present

## 2017-01-10 DIAGNOSIS — M545 Low back pain, unspecified: Secondary | ICD-10-CM

## 2017-01-10 DIAGNOSIS — O9989 Other specified diseases and conditions complicating pregnancy, childbirth and the puerperium: Secondary | ICD-10-CM | POA: Diagnosis present

## 2017-01-10 DIAGNOSIS — Z3A01 Less than 8 weeks gestation of pregnancy: Secondary | ICD-10-CM | POA: Diagnosis not present

## 2017-01-10 DIAGNOSIS — O26891 Other specified pregnancy related conditions, first trimester: Secondary | ICD-10-CM | POA: Insufficient documentation

## 2017-01-10 LAB — URINALYSIS, ROUTINE W REFLEX MICROSCOPIC
Bilirubin Urine: NEGATIVE
Glucose, UA: NEGATIVE mg/dL
Ketones, ur: NEGATIVE mg/dL
Nitrite: NEGATIVE
Protein, ur: 30 mg/dL — AB
SPECIFIC GRAVITY, URINE: 1.026 (ref 1.005–1.030)
pH: 5 (ref 5.0–8.0)

## 2017-01-10 LAB — PREGNANCY, URINE: PREG TEST UR: POSITIVE — AB

## 2017-01-10 MED ORDER — CEPHALEXIN 500 MG PO CAPS: 500.0000 mg | ORAL_CAPSULE | Freq: Two times a day (BID) | ORAL | 0 refills | Status: DC

## 2017-01-10 MED ORDER — CYCLOBENZAPRINE HCL 10 MG PO TABS: 10.0000 mg | ORAL_TABLET | Freq: Two times a day (BID) | ORAL | 0 refills | Status: DC | PRN

## 2017-01-10 NOTE — ED Triage Notes (Addendum)
PT c/o lower back pain that started while bending over at the sink yesterday. PT states some relief from flexeril and ibuprofen at home. PT also states that she had a positive pregnancy test yesterday at home.

## 2017-01-10 NOTE — Discharge Instructions (Signed)
Medications: Flexeril, Keflex  Treatment: Take Flexeril twice daily as needed for muscle pain and spasms.  Do not drive or operate machinery while taking this medication.  You can take up to 4000 mg of Tylenol within 24-hour period.  Take 1000 mg of Tylenol every 6 hours as needed for pain.  Take Keflex twice daily for 7 days for suspected urinary tract infection.  A culture will be sent of your urine and you will be called if a change is to be made in your antibiotic.  Keep taking your prenatal vitamins.  Attempt the exercises and stretches 1-2 times daily as tolerated.  Use a heating pad 3-4 times daily alternating 15 minutes on, 15 minutes off.  Follow-up: Please return to the emergency department if you develop any new or worsening symptoms including loss of bowel or bladder control, numbness in your groin or complete numbness in your legs, or any other new or concerning symptoms.  Please follow-up with the OB/GYN by calling for an appointment.

## 2017-01-10 NOTE — ED Provider Notes (Signed)
[facility] EMERGENCY DEPARTMENT Provider Note   CSN: [id number] Arrival date & time: 01/10/17  1323     History   Chief Complaint Chief Complaint  Patient presents with  . Back Pain    HPI Haley Washington is a 31 y.o. female with history of low back pain, obesity who presents with low back pain that began while bending over the sink washing her hands yesterday.  Her pain is worse with movement.  Patient reports she has had this pain in the past, however it usually resolves in a few days.  She has been taking ibuprofen and Flexeril at home with some relief.  She has also used a heating pad at home.  Patient reports having to have physical therapy in the past, which did improve her symptoms.  Patient also reports taking a home pregnancy test yesterday which was positive.  LMP 11/29/2016.  She denies any abdominal pain, urinary symptoms, abnormal vaginal bleeding or discharge.  She also denies any numbness or tingling, saddle anesthesia, bowel or bladder incontinence.  She denies any recent surgeries or injections in the back.  Patient reports she has had a steroid shot by her PCP in the past when her back has flared up like this, which did not improve her symptoms.  HPI  Past Medical History:  Diagnosis Date  . Acne    has been on aldactone in the past.  Also tried Accutane without improvement in her symptoms   . Allergy   . History of chicken pox   . Hx: UTI (urinary tract infection)   . Hyperlipidemia   . Low back pain 2017   improved with physical therapy  . Morbid obesity with BMI of 40.0-44.9, adult [facility])     Patient Active Problem List   Diagnosis Date Noted  . Right knee injury, initial encounter 12/30/2016  . Right wrist injury, initial encounter 12/30/2016  . Missed abortion 10/27/2016  . Morbid obesity with BMI of 40.0-44.9, adult [facility])     Past Surgical History:  Procedure Laterality Date  . ADENOIDECTOMY    . DILATION AND EVACUATION N/A 10/27/2016   Procedure:  DILATATION AND EVACUATION;  Surgeon: Waynard Reeds, MD;  Location: [facility] BIRTHING SUIT[facility];  Service: Gynecology;  Laterality: N/A;    OB History    Gravida Para Term Preterm AB Living   2       1     SAB TAB Ectopic Multiple Live Births   1               Home Medications    Prior to Admission medications   Medication Sig Start Date End Date Taking? Authorizing Provider  albuterol (PROVENTIL HFA;VENTOLIN HFA) 108 (90 Base) MCG/ACT inhaler Inhale into the lungs every 6 (six) hours as needed for wheezing or shortness of breath.    [provider]  ampicillin (PRINCIPEN) 500 MG capsule Take 1 capsule by mouth daily. Take with food- using for acne 09/30/16   [provider]  cephALEXin (KEFLEX) 500 MG capsule Take 1 capsule (500 mg total) by mouth 2 (two) times daily. 01/10/17   Rayon Mcchristian, Waylan Boga, PA-C  cetirizine (ZYRTEC) 10 MG tablet Take 10 mg by mouth daily.    [provider]  Clindamycin-Benzoyl Per, Refr, gel APPLY UTD TO SPOTS UTD 09/30/16   [provider]  cyclobenzaprine (FLEXERIL) 10 MG tablet Take 1 tablet (10 mg total) by mouth 2 (two) times daily as needed for muscle spasms. 01/10/17   Phoebie Shad,  Paizley Ramella M, PA-C  FINACEA 15 % FOAM APP EXT AA QD 10/02/16   [provider]  FLAXSEED, LINSEED, PO Take 1 tablet by mouth daily.    [provider]  GLUCOSAMINE-CHONDROITIN PO Take 2 tablets by mouth daily.     [provider]  HYDROcodone-acetaminophen (NORCO/VICODIN) 5-325 MG tablet 1-2 tablets every 4-6 hours as needed for pain Patient not taking: Reported on 12/25/2016 10/27/16   Waynard Reedsoss, Kendra, MD  ibuprofen (ADVIL,MOTRIN) 200 MG tablet Take 200 mg by mouth every 6 (six) hours as needed for mild pain or cramping.    [provider]  ibuprofen (ADVIL,MOTRIN) 600 MG tablet Take 1 tablet (600 mg total) by mouth every 6 (six) hours as needed. 10/27/16   Waynard Reedsoss, Kendra, MD  mometasone (NASONEX) 50 MCG/ACT nasal spray Place 1-2 sprays  into the nose daily. 08/25/16   Kozlow, Alvira PhilipsEric J, MD  Prenatal Vit-Fe Fumarate-FA (PRENATAL VITAMIN PO) Take 1 tablet by mouth daily.    [provider]  Probiotic Product (PROBIOTIC DAILY PO) Take 1 capsule by mouth daily.    [provider]    Family History Family History  Problem Relation Age of Onset  . Hyperlipidemia Mother   . Hypertension Mother   . Obesity Mother   . Alcohol abuse Father        recurring  . Obesity Brother   . Mental illness Maternal Grandmother        anxiety  . Arthritis Maternal Grandfather   . Cancer Maternal Grandfather        colon, diagnosed in 2080's  . Diabetes Maternal Grandfather   . Heart disease Paternal Grandfather   . Cancer Paternal Grandfather        lymphoma, ? lung CA- smoker    Social History Social History   Tobacco Use  . Smoking status: Never Smoker  . Smokeless tobacco: Never Used  Substance Use Topics  . Alcohol use: Yes    Comment: rarely  . Drug use: No     Allergies   Lactose intolerance (gi)   Review of Systems Review of Systems  Constitutional: Negative for chills and fever.  HENT: Negative for facial swelling and sore throat.   Respiratory: Negative for shortness of breath.   Cardiovascular: Negative for chest pain.  Gastrointestinal: Negative for abdominal pain, nausea and vomiting.  Genitourinary: Negative for dysuria, vaginal bleeding and vaginal discharge.  Musculoskeletal: Positive for back pain. Negative for neck pain.  Skin: Negative for rash and wound.  Neurological: Negative for weakness, numbness and headaches.  Psychiatric/Behavioral: The patient is not nervous/anxious.      Physical Exam Updated Vital Signs BP (!) 158/90 (BP Location: Right Arm)   Pulse 79   Temp 98.5 F (36.9 C) (Oral)   Resp 18   Ht 5\' 4"  (1.626 m)   Wt 127 kg (280 lb)   LMP 11/29/2016   SpO2 100%   BMI 48.06 kg/m   Physical Exam  Constitutional: She appears well-developed and well-nourished. No  distress.  HENT:  Head: Normocephalic and atraumatic.  Mouth/Throat: Oropharynx is clear and moist. No oropharyngeal exudate.  Eyes: Conjunctivae are normal. Pupils are equal, round, and reactive to light. Right eye exhibits no discharge. Left eye exhibits no discharge. No scleral icterus.  Neck: Normal range of motion. Neck supple. No thyromegaly present.  Cardiovascular: Normal rate, regular rhythm, normal heart sounds and intact distal pulses. Exam reveals no gallop and no friction rub.  No murmur heard. Pulmonary/Chest: Effort normal  and breath sounds normal. No stridor. No respiratory distress. She has no wheezes. She has no rales.  Abdominal: Soft. Bowel sounds are normal. She exhibits no distension. There is no tenderness. There is no rebound and no guarding.  Musculoskeletal: She exhibits no edema.  No midline cervical, thoracic, or lumbar tenderness 5/5 strength all 4 extremities, normal sensation; equal bilateral grip strength Full range of motion of the hips without pain  Lymphadenopathy:    She has no cervical adenopathy.  Neurological: She is alert. Coordination normal.  Skin: Skin is warm and dry. No rash noted. She is not diaphoretic. No pallor.  Psychiatric: She has a normal mood and affect.  Nursing note and vitals reviewed.    ED Treatments / Results  Labs (all labs ordered are listed, but only abnormal results are displayed) Labs Reviewed  URINALYSIS, ROUTINE W REFLEX MICROSCOPIC - Abnormal; Notable for the following components:      Result Value   APPearance CLOUDY (*)    Hgb urine dipstick MODERATE (*)    Protein, ur 30 (*)    Leukocytes, UA LARGE (*)    Bacteria, UA RARE (*)    Squamous Epithelial / LPF TOO NUMEROUS TO COUNT (*)    All other components within normal limits  PREGNANCY, URINE - Abnormal; Notable for the following components:   Preg Test, Ur POSITIVE (*)    All other components within normal limits  URINE CULTURE    EKG  EKG  Interpretation None       Radiology No results found.  Procedures Procedures (including critical care time)  Medications Ordered in ED Medications - No data to display   Initial Impression / Assessment and Plan / ED Course  I have reviewed the triage vital signs and the nursing notes.  Pertinent labs & imaging results that were available during my care of the patient were reviewed by me and considered in my medical decision making (see chart for details).     Patient with back pain worse with movement.  normal neuro exam without focal deficits.  No saddle anesthesia or bowel or bladder incontinence.  No midline tenderness or tenderness of the surrounding musculature.  Suspect musculoskeletal cause, however UA shows moderate hematuria, large leukocytes, 6-30 WBCs, but too numerous to count epithelial cells.  Considering patient's back pain as well as recent positive pregnancy test, confirmed here, will initiate Keflex.  Urine culture sent.  Will also refill patient's Flexeril prescription.  She is advised to discontinue with ibuprofen and switch to Tylenol.  She is already taking prenatal vitamins at home.  I advised her to follow-up with her primary care provider for her back pain as well as OB/GYN for prenatal care.  Return precautions discussed.  Patient understands and agrees with plan.  Patient vitals stable throughout ED course and discharged in satisfactory condition.  Final Clinical Impressions(s) / ED Diagnoses   Final diagnoses:  Acute midline low back pain without sciatica  Lower urinary tract infectious disease    ED Discharge Orders        Ordered    cyclobenzaprine (FLEXERIL) 10 MG tablet  2 times daily PRN     01/10/17 1605    cephALEXin (KEFLEX) 500 MG capsule  2 times daily     01/10/17 [id number]       Demisha Nokes M, PA-C 01/10/17 1653    Samuel JesterMcManus, Kathleen, DO 01/12/17 [phone]

## 2017-01-12 LAB — URINE CULTURE: Special Requests: NORMAL

## 2017-01-19 NOTE — L&D Delivery Note (Signed)
Delivery Note Patient pushed well for 45 minutes.  Baby was noted to be ROT.  It was rotated to ROA with good descent.  She pushed for an additional two hours.  At 10:06 AM a viable female was delivered via Vaginal, Spontaneous (Presentation: OA ).  APGAR: 7, 9; weight pending.  Nuchal cord x 1.  Placenta status:spontaneous, in tact .  Cord:3V  with the following complications: None .  Cord pH: none  Anesthesia:  Epidural Episiotomy: None Lacerations: Vaginal, bilateral Suture Repair: 2.0 vicryl rapide Est. Blood Loss (mL): 500  Mom to postpartum.  Baby to Couplet care / Skin to Skin.  Wilna Pennie GEFFEL Magdala Brahmbhatt 09/11/2017, 11:13 AM

## 2017-02-17 LAB — OB RESULTS CONSOLE RUBELLA ANTIBODY, IGM: RUBELLA: IMMUNE

## 2017-02-17 LAB — OB RESULTS CONSOLE GC/CHLAMYDIA
Chlamydia: NEGATIVE
Gonorrhea: NEGATIVE

## 2017-02-17 LAB — OB RESULTS CONSOLE HEPATITIS B SURFACE ANTIGEN: HEP B S AG: NEGATIVE

## 2017-02-17 LAB — OB RESULTS CONSOLE HIV ANTIBODY (ROUTINE TESTING): HIV: NONREACTIVE

## 2017-02-17 LAB — OB RESULTS CONSOLE RPR: RPR: NONREACTIVE

## 2017-03-19 ENCOUNTER — Ambulatory Visit: Payer: Self-pay | Admitting: *Deleted

## 2017-03-19 ENCOUNTER — Ambulatory Visit: Payer: BLUE CROSS/BLUE SHIELD | Admitting: Family Medicine

## 2017-03-19 ENCOUNTER — Encounter: Payer: Self-pay | Admitting: Family Medicine

## 2017-03-19 VITALS — BP 120/86 | HR 114 | Temp 98.1°F | Ht 64.0 in | Wt 270.1 lb

## 2017-03-19 DIAGNOSIS — J40 Bronchitis, not specified as acute or chronic: Secondary | ICD-10-CM | POA: Diagnosis not present

## 2017-03-19 MED ORDER — AMOXICILLIN 875 MG PO TABS: 875.0000 mg | ORAL_TABLET | Freq: Two times a day (BID) | ORAL | 0 refills | Status: AC

## 2017-03-19 NOTE — Progress Notes (Signed)
Pre visit review using our clinic review tool, if applicable. No additional management support is needed unless otherwise documented below in the visit note. 

## 2017-03-19 NOTE — Telephone Encounter (Addendum)
Pt states that she started coughing on Sunday and contacted her OB/GYN for recommendations;she states that she is [redacted] weeks pregnant;  she states that OTC medications (mucinex dm, robitussin, cough drops) are not working; she has also used her neti pot she is also coughing up thick yellow secretions, and is having the same type nasal drainage; nurse triage inititiated and recommendations made per protocol to include seeing a physician within 24 hours; pt offered and accepted appointment with Dr Wendling today at 1500 ; pt verbalizes understanding. Reason for Disposition . [1] Continuous (nonstop) coughing interferes with work or school AND [2] no improvement using cough treatment per protocol  Answer Assessment - Initial Assessment Questions 1. ONSET: "When did the cough begin?"      Sunday Mar 14, 2017 2. SEVERITY: "How bad is the cough today?"      moderate 3. RESPIRATORY DISTRESS: "Describe your breathing."      ok 4. FEVER: "Do you have a fever?" If so, ask: "What is your temperature, how was it measured, and when did it start?"     no 5. HEMOPTYSIS: "Are you coughing up any blood?" If so ask: "How much?" (flecks, streaks, tablespoons, etc.)     no 6. TREATMENT: "What have you done so far to treat the cough?" (e.g., meds, fluids, humidifier)     Cough drops, robitussin, and mucinex dm 7. CARDIAC HISTORY: "Do you have any history of heart disease?" (e.g., heart attack, congestive heart failure)      no 8. LUNG HISTORY: "Do you have any history of lung disease?"  (e.g., pulmonary embolus, asthma, emphysema)     no 9. PE RISK FACTORS: "Do you have a history of blood clots?" (or: recent major surgery, recent prolonged travel, bedridden )     Miscarriage 10/2016 had D&C 10. OTHER SYMPTOMS: "Do you have any other symptoms? (e.g., runny nose, wheezing, chest pain)      Cough feels like wheezing sometimes; nasal, ear, and sinus congestion; pulsing sensation in ears when blowing her nose   11.  PREGNANCY: "Is there any chance you are pregnant?" "When was your last menstrual period?"       [redacted]  weeks pregnant 12. TRAVEL: "Have you traveled out of the country in the last month?" (e.g., travel history, exposures)       no  Protocols used: COUGH - ACUTE NON-PRODUCTIVE-A-AH

## 2017-03-19 NOTE — Progress Notes (Signed)
Chief Complaint  Patient presents with  . nasal congestion and cough    Haley RiegerKristen E Washington here for URI complaints. She is [redacted] weeks pregnant.    Duration: 5 days  Associated symptoms: sinus congestion, rhinorrhea and cough Denies: sinus pain, itchy watery eyes, ear pain, ear drainage, sore throat, wheezing, shortness of breath, myalgia and fevers Treatment to date: Tylenol Sick contacts: Yes; husband was sick and tx'd with amox and got better, he was sick day before her  ROS:  Const: Denies fevers HEENT: As noted in HPI Lungs: No SOB  Past Medical History:  Diagnosis Date  . Acne    has been on aldactone in the past.  Also tried Accutane without improvement in her symptoms   . Allergy   . History of chicken pox   . Hx: UTI (urinary tract infection)   . Hyperlipidemia   . Low back pain 2017   improved with physical therapy  . Morbid obesity with BMI of 40.0-44.9, adult (HCC)    Family History  Problem Relation Age of Onset  . Hyperlipidemia Mother   . Hypertension Mother   . Obesity Mother   . Alcohol abuse Father        recurring  . Obesity Brother   . Mental illness Maternal Grandmother        anxiety  . Arthritis Maternal Grandfather   . Cancer Maternal Grandfather        colon, diagnosed in 1480's  . Diabetes Maternal Grandfather   . Heart disease Paternal Grandfather   . Cancer Paternal Grandfather        lymphoma, ? lung CA- smoker    BP 120/86 (BP Location: Left Arm, Patient Position: Sitting, Cuff Size: Large)   Pulse (!) 114   Temp 98.1 F (36.7 C) (Oral)   Ht 5\' 4"  (1.626 m)   Wt 270 lb 2 oz (122.5 kg)   LMP 11/29/2016   SpO2 97%   BMI 46.37 kg/m  General: Awake, alert, appears stated age HEENT: AT, [place], ears patent b/l and TM neg on R, with fluid on L, nares patent w/o discharge, pharynx pink and without exudates, MMM Neck: No masses or asymmetry Heart: RRR Lungs: CTAB, no accessory muscle use Psych: Age appropriate judgment and insight, normal mood  and affect  Bronchitis - Plan: amoxicillin (AMOXIL) 875 MG tablet  I suspect this is a viral URI. I would like to do a pocket rx, take over next 2 d if no better. If she had what her husband did, I think she will start to improve.  Continue to push fluids, practice good hand hygiene, cover mouth when coughing. Would consider steroid for ear, but as she is pregnant, will hold off. Cont INCS as allowed by OB.  F/u prn. If starting to experience fevers, shaking, or shortness of breath, seek immediate care. Pt voiced understanding and agreement to the plan.  Jilda Rocheicholas Paul CliftonWendling, DO 03/19/17 4:40 PM

## 2017-03-19 NOTE — Patient Instructions (Signed)
Wait a couple days and if not getting better, OK to take amoxicillin.   Continue to push fluids, practice good hand hygiene, and cover your mouth if you cough.  If you start having fevers, shaking or shortness of breath, seek immediate care.  For symptoms, consider using Vick's VapoRub on chest or under nose, air humidifier,and elevating the head of the bed. T

## 2017-06-09 LAB — OB RESULTS CONSOLE RPR: RPR: NONREACTIVE

## 2017-06-23 ENCOUNTER — Other Ambulatory Visit: Payer: Self-pay

## 2017-06-23 MED ORDER — MOMETASONE FUROATE 50 MCG/ACT NA SUSP: NASAL | 0 refills | Status: DC

## 2017-06-28 ENCOUNTER — Encounter: Payer: Self-pay | Admitting: Family

## 2017-06-28 ENCOUNTER — Ambulatory Visit (INDEPENDENT_AMBULATORY_CARE_PROVIDER_SITE_OTHER): Payer: PRIVATE HEALTH INSURANCE | Admitting: Family

## 2017-06-28 VITALS — BP 118/63 | HR 106 | Temp 98.1°F | Resp 16 | Ht 64.0 in | Wt 283.6 lb

## 2017-06-28 DIAGNOSIS — L309 Dermatitis, unspecified: Secondary | ICD-10-CM | POA: Diagnosis not present

## 2017-06-28 MED ORDER — BETAMETHASONE DIPROPIONATE 0.05 % EX CREA: TOPICAL_CREAM | Freq: Two times a day (BID) | CUTANEOUS | 0 refills | Status: DC | PRN

## 2017-06-28 NOTE — Patient Instructions (Signed)
Begin betamethasone cream as needed.  Let me know if you would like referral to dermatology if symptoms fail to improve.  Good luck!

## 2017-06-28 NOTE — Progress Notes (Signed)
Subjective:    Patient ID: Haley RiegerKristen E Washington, female    DOB: Aug 06, 1985, 32 y.o.   MRN: [id number]  HPI   Ms. Haley Washington is a 32 yr old female who presents today with chief complaint of rash.  Rash has been present x 2 months. She is [redacted] weeks pregnant. Rash is pruritic. Using eucerin, benadryl ointment, hydrocodone, and ceterizine without improvement. Reports that Aveeno calming lotion works the best. Saw her OB and OB thought it might be PUPPPS rash.    Review of Systems See HPI  Past Medical History:  Diagnosis Date  . Acne    has been on aldactone in the past.  Also tried Accutane without improvement in her symptoms   . Allergy   . History of chicken pox   . Hx: UTI (urinary tract infection)   . Hyperlipidemia   . Low back pain 2017   improved with physical therapy  . Morbid obesity with BMI of 40.0-44.9, adult [facility](HCC)      Social History   Socioeconomic History  . Marital status: Married    Spouse name: Not on file  . Number of children: Not on file  . Years of education: Not on file  . Highest education level: Not on file  Occupational History  . Not on file  Social Needs  . Financial resource strain: Not on file  . Food insecurity:    Worry: Not on file    Inability: Not on file  . Transportation needs:    Medical: Not on file    Non-medical: Not on file  Tobacco Use  . Smoking status: Never Smoker  . Smokeless tobacco: Never Used  Substance and Sexual Activity  . Alcohol use: Yes    Comment: rarely  . Drug use: No  . Sexual activity: Yes    Partners: Male    Birth control/protection: None  Lifestyle  . Physical activity:    Days per week: Not on file    Minutes per session: Not on file  . Stress: Not on file  Relationships  . Social connections:    Talks on phone: Not on file    Gets together: Not on file    Attends religious service: Not on file    Active member of club or organization: Not on file    Attends meetings of clubs or organizations: Not on  file    Relationship status: Not on file  . Intimate partner violence:    Fear of current or ex partner: Not on file    Emotionally abused: Not on file    Physically abused: Not on file    Forced sexual activity: Not on file  Other Topics Concern  . 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 ADENOIDECTOMY    . DILATION AND EVACUATION N/A 10/27/2016   Procedure: DILATATION AND EVACUATION;  Surgeon: Waynard Reedsoss, Kendra, MD;  [facility];  Service: Gynecology;  Laterality: N/A;    Family History  Problem Relation Age of Onset  . Hyperlipidemia Mother   . Hypertension Mother   . Obesity Mother   . Alcohol abuse Father        recurring  . Obesity Brother   . Mental illness Maternal Grandmother  anxiety  . Arthritis Maternal Grandfather   . Cancer Maternal Grandfather        colon, diagnosed in 52's  . Diabetes Maternal Grandfather   . Heart disease Paternal Grandfather   . Cancer Paternal Grandfather        lymphoma, ? lung CA- smoker    Allergies  Allergen Reactions  . Lactose Intolerance (Gi) Other (See Comments)    GI upset    Current Outpatient Medications on File Prior to Visit  Medication Sig Dispense Refill  . ampicillin (PRINCIPEN) 500 MG capsule Take 1 capsule by mouth daily. Take with food- using for acne  3  . cetirizine (ZYRTEC) 10 MG tablet Take 10 mg by mouth daily.    . Clindamycin-Benzoyl Per, Refr, gel APPLY UTD TO SPOTS UTD  1  . FLAXSEED, LINSEED, PO Take 1 tablet by mouth daily.    Marland Kitchen GLUCOSAMINE-CHONDROITIN PO Take 2 tablets by mouth daily.     . mometasone (NASONEX) 50 MCG/ACT nasal spray Use one to two sprays in each nostril once daily as directed. 51 g 0  . Prenatal Vit-Fe Fumarate-FA (PRENATAL VITAMIN PO) Take 1 tablet by mouth daily.     . Probiotic Product (PROBIOTIC DAILY PO) Take 1 capsule by mouth daily.     No current facility-administered medications on file prior to visit.     BP 118/63 (BP Location: Left Arm, Cuff Size: Large)   Pulse (!) 106   Temp 98.1 F (36.7 C) (Oral)   Resp 16   Ht 5\' 4"  (1.626 m)   Wt 283 lb 9.6 oz (128.6 kg)   LMP 11/29/2016   SpO2 99%   BMI 48.68 kg/m       Objective:   Physical Exam  Constitutional: She appears well-developed and well-nourished.  Skin: Skin is warm and dry.  Multiple scabbed lesions on bilateral forearms           Assessment & Plan:  Dermatitis- I am not certain that this represents PUPPS rash as the rash is not striated, but it is certainly possible. Will rx with steroid cream. Pt will continue emollients and cetirizine.  I have advised her to let me know if symptoms worsen or fail to improve and we can place referral to dermatology.

## 2017-08-03 LAB — OB RESULTS CONSOLE GBS: STREP GROUP B AG: NEGATIVE

## 2017-09-05 ENCOUNTER — Inpatient Hospital Stay (HOSPITAL_COMMUNITY)
Admission: AD | Admit: 2017-09-05 | Payer: BLUE CROSS/BLUE SHIELD | Source: Ambulatory Visit | Admitting: Obstetrics and Gynecology

## 2017-09-09 ENCOUNTER — Other Ambulatory Visit: Payer: Self-pay | Admitting: Obstetrics and Gynecology

## 2017-09-10 ENCOUNTER — Encounter (HOSPITAL_COMMUNITY): Payer: Self-pay

## 2017-09-10 ENCOUNTER — Inpatient Hospital Stay (HOSPITAL_COMMUNITY): Payer: No Typology Code available for payment source | Admitting: Anesthesiology

## 2017-09-10 ENCOUNTER — Inpatient Hospital Stay (HOSPITAL_COMMUNITY)
Admission: RE | Admit: 2017-09-10 | Discharge: 2017-09-13 | DRG: 807 | Disposition: A | Discharge status: Home / Self Care | Payer: No Typology Code available for payment source | Attending: Obstetrics | Admitting: Obstetrics

## 2017-09-10 ENCOUNTER — Other Ambulatory Visit: Payer: Self-pay

## 2017-09-10 DIAGNOSIS — O99214 Obesity complicating childbirth: Secondary | ICD-10-CM | POA: Diagnosis present

## 2017-09-10 DIAGNOSIS — O480 Post-term pregnancy: Secondary | ICD-10-CM | POA: Diagnosis present

## 2017-09-10 DIAGNOSIS — Z3A40 40 weeks gestation of pregnancy: Secondary | ICD-10-CM

## 2017-09-10 LAB — CBC
HCT: 32.8 % — ABNORMAL LOW (ref 36.0–46.0)
HEMOGLOBIN: 10.5 g/dL — AB (ref 12.0–15.0)
MCH: 28.1 pg (ref 26.0–34.0)
MCHC: 32 g/dL (ref 30.0–36.0)
MCV: 87.7 fL (ref 78.0–100.0)
PLATELETS: 165 10*3/uL (ref 150–400)
RBC: 3.74 MIL/uL — AB (ref 3.87–5.11)
RDW: 15.3 % (ref 11.5–15.5)
WBC: 8.7 10*3/uL (ref 4.0–10.5)

## 2017-09-10 LAB — TYPE AND SCREEN
ABO/RH(D): O POS
ANTIBODY SCREEN: NEGATIVE

## 2017-09-10 LAB — ABO/RH: ABO/RH(D): O POS

## 2017-09-10 LAB — RPR: RPR: NONREACTIVE

## 2017-09-10 MED ORDER — LIDOCAINE HCL (PF) 1 % IJ SOLN
30.0000 mL | INTRAMUSCULAR | Status: DC | PRN
  Filled 2017-09-10: qty 30

## 2017-09-10 MED ORDER — PHENYLEPHRINE 40 MCG/ML (10ML) SYRINGE FOR IV PUSH (FOR BLOOD PRESSURE SUPPORT)
80.0000 ug | PREFILLED_SYRINGE | INTRAVENOUS | Status: DC | PRN
  Filled 2017-09-10: qty 5
  Filled 2017-09-10: qty 10

## 2017-09-10 MED ORDER — ONDANSETRON HCL 4 MG/2ML IJ SOLN
4.0000 mg | Freq: Four times a day (QID) | INTRAMUSCULAR | Status: DC | PRN
  Administered 2017-09-10 (×2): 4 mg via INTRAVENOUS
  Filled 2017-09-10 (×2): qty 2

## 2017-09-10 MED ORDER — LACTATED RINGERS IV SOLN: 500.0000 mL | INTRAVENOUS | Status: DC | PRN

## 2017-09-10 MED ORDER — EPHEDRINE 5 MG/ML INJ
10.0000 mg | INTRAVENOUS | Status: DC | PRN
  Filled 2017-09-10: qty 2

## 2017-09-10 MED ORDER — LIDOCAINE HCL (PF) 1 % IJ SOLN
INTRAMUSCULAR | Status: DC | PRN
  Administered 2017-09-10 (×2): 6 mL via EPIDURAL

## 2017-09-10 MED ORDER — LACTATED RINGERS IV SOLN: 500.0000 mL | Freq: Once | INTRAVENOUS | Status: DC

## 2017-09-10 MED ORDER — DIPHENHYDRAMINE HCL 50 MG/ML IJ SOLN: 12.5000 mg | INTRAMUSCULAR | Status: DC | PRN

## 2017-09-10 MED ORDER — MISOPROSTOL 25 MCG QUARTER TABLET
25.0000 ug | ORAL_TABLET | ORAL | Status: DC | PRN
  Administered 2017-09-10 (×2): 25 ug via VAGINAL
  Filled 2017-09-10 (×3): qty 1

## 2017-09-10 MED ORDER — FENTANYL 2.5 MCG/ML BUPIVACAINE 1/10 % EPIDURAL INFUSION (WH - ANES)
14.0000 mL/h | INTRAMUSCULAR | Status: DC | PRN
  Administered 2017-09-10 – 2017-09-11 (×2): 14 mL/h via EPIDURAL
  Filled 2017-09-10 (×3): qty 100

## 2017-09-10 MED ORDER — TERBUTALINE SULFATE 1 MG/ML IJ SOLN
0.2500 mg | Freq: Once | INTRAMUSCULAR | Status: DC | PRN
  Filled 2017-09-10: qty 1

## 2017-09-10 MED ORDER — OXYTOCIN 40 UNITS IN LACTATED RINGERS INFUSION - SIMPLE MED
2.5000 [IU]/h | INTRAVENOUS | Status: DC
  Administered 2017-09-11: 2.5 [IU]/h via INTRAVENOUS

## 2017-09-10 MED ORDER — SOD CITRATE-CITRIC ACID 500-334 MG/5ML PO SOLN: 30.0000 mL | ORAL | Status: DC | PRN

## 2017-09-10 MED ORDER — ACETAMINOPHEN 325 MG PO TABS: 650.0000 mg | ORAL_TABLET | ORAL | Status: DC | PRN

## 2017-09-10 MED ORDER — OXYTOCIN BOLUS FROM INFUSION: 500.0000 mL | Freq: Once | INTRAVENOUS | Status: DC

## 2017-09-10 MED ORDER — FENTANYL CITRATE (PF) 100 MCG/2ML IJ SOLN
50.0000 ug | INTRAMUSCULAR | Status: DC | PRN
  Administered 2017-09-10: 100 ug via INTRAVENOUS
  Filled 2017-09-10: qty 2

## 2017-09-10 MED ORDER — PHENYLEPHRINE 40 MCG/ML (10ML) SYRINGE FOR IV PUSH (FOR BLOOD PRESSURE SUPPORT)
80.0000 ug | PREFILLED_SYRINGE | INTRAVENOUS | Status: DC | PRN
  Filled 2017-09-10: qty 5

## 2017-09-10 MED ORDER — LACTATED RINGERS IV SOLN
INTRAVENOUS | Status: DC
  Administered 2017-09-10 – 2017-09-11 (×4): via INTRAVENOUS

## 2017-09-10 MED ORDER — OXYTOCIN 40 UNITS IN LACTATED RINGERS INFUSION - SIMPLE MED
1.0000 m[IU]/min | INTRAVENOUS | Status: DC
  Administered 2017-09-10: 1 m[IU]/min via INTRAVENOUS
  Filled 2017-09-10: qty 1000

## 2017-09-10 NOTE — Progress Notes (Signed)
Dr. Chestine Sporelark ordered to start Pitocin at 0930 after this cevrical check and foley bulb placement

## 2017-09-10 NOTE — H&P (Signed)
32 y.o. G2P0010 @ [redacted]w[redacted]d presents for IOL for post term pregnancy.  She has good fetal movement and no bleeding.  She received two doses of cytotec overnight.   Pregnancy c/b:   1. Morbid obesity: starting BMI 46   Past Medical History:  Diagnosis Date  . Acne    has been on aldactone in the past.  Also tried Accutane without improvement in her symptoms   . Allergy   . History of chicken pox   . Hx: UTI (urinary tract infection)   . Hyperlipidemia   . Low back pain 2017   improved with physical therapy  . Morbid obesity with BMI of 40.0-44.9, adult [facility])     Past Surgical History:  Procedure Laterality Date  . ADENOIDECTOMY    . DILATION AND EVACUATION N/A 10/27/2016   Procedure: DILATATION AND EVACUATION;  Surgeon: Waynard Reeds, MD;  Location: [facility] BIRTHING SUITES;  Service: Gynecology;  Laterality: N/A;    OB History  Gravida Para Term Preterm AB Living  2       1    SAB TAB Ectopic Multiple Live Births  1            # Outcome Date GA Lbr Len/2nd Weight Sex Delivery Anes PTL Lv  2 Current           1 SAB             Social History   Socioeconomic History  . Marital status: Married    Spouse name: Vicente Serene  . Number of children: Not on file  . Years of education: Not on file  . Highest education level: Not on file  Occupational History  . Not on file  Tobacco Use  . Smoking status: Never Smoker  . Smokeless tobacco: Never Used  Substance and Sexual Activity  . Alcohol use: Yes    Comment: rarely  . Drug use: No  . Sexual activity: Yes    Partners: Male    Birth control/protection: None    Comment: natural family planning (NFP)  Lifestyle  . Physical activity:    Days per week: 0 days    Minutes per session: 0 min  . 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   Lactose intolerance (gi)    Prenatal Transfer Tool   Maternal Diabetes: No  (failed 1 hour, passed 3 hour) Genetic Screening: Normal Maternal Ultrasounds/Referrals: Normal Fetal Ultrasounds or other Referrals:  None Maternal Substance Abuse:  No Significant Maternal Medications:  Meds include: Other:  zyretec, prilosec Significant Maternal Lab Results: Lab values include: Group B Strep negative  ABO, Rh: --/--/O POS (08/23 0101) Antibody: NEG (08/23 0101) Rubella: Immune (01/30 0000) RPR: Nonreactive (05/22 0000)  HBsAg: Negative (01/30 0000)  HIV: Non-reactive (01/30 0000)  GBS: Negative (07/16 0000)      Vitals:   09/10/17 0701 09/10/17 0715  BP: (!) 98/44   Pulse: 99   Resp:    Temp:  98 F (36.7 C)     General:  NAD Abdomen:  soft, gravid, obese, EFW 7.5# SVE:  1.5/50/posterior / high, FB placed w 60cc fluid FHTs:  130s, mod var, + accels, Cat 1 Toco:  irregular  EFW (7/2) was 4lb 15oz (63%)  A/P   32 y.o. G2P0010 [redacted]w[redacted]d presents with post-term pregnancy Epidural upon request  FB, will start low dose pitocin at 0930 SCDs FSR/ vtx/ GBS neg  [facility] Haley Washington

## 2017-09-10 NOTE — Anesthesia Pain Management Evaluation Note (Signed)
  CRNA Pain Management Visit Note  Patient: Haley Washington, 32 y.o., female  "Hello I am a member of the anesthesia team at [facility]. We have an anesthesia team available at all times to provide care throughout the hospital, including epidural management and anesthesia for C-section. I don't know your plan for the delivery whether it a natural birth, water birth, IV sedation, nitrous supplementation, doula or epidural, but we want to meet your pain goals."   1.Was your pain managed to your expectations on prior hospitalizations?   Yes   2.What is your expectation for pain management during this hospitalization?     Epidural  3.How can we help you reach that goal? Epidural when appropriate  Record the patient's initial score and the patient's pain goal.   Pain: 1  Pain Goal: 5 [facility] wants you to be able to say your pain was always managed very well.  Cleda ClarksBrowder, Ralphie Lovelady R 09/10/2017

## 2017-09-10 NOTE — Progress Notes (Signed)
Pt appears anxious and apprehensive of experiencing contractions.

## 2017-09-10 NOTE — Anesthesia Preprocedure Evaluation (Signed)
Anesthesia Evaluation  Patient identified by MRN, date of birth, ID band Patient awake    Reviewed: Allergy & Precautions, H&P , NPO status , Patient's Chart, lab work & pertinent test results  Airway Mallampati: III  TM Distance: >3 FB Neck ROM: full    Dental no notable dental hx. (+) Teeth Intact   Pulmonary neg pulmonary ROS,    Pulmonary exam normal breath sounds clear to auscultation       Cardiovascular negative cardio ROS Normal cardiovascular exam Rhythm:regular Rate:Normal     Neuro/Psych negative neurological ROS  negative psych ROS   GI/Hepatic negative GI ROS, Neg liver ROS,   Endo/Other  Morbid obesity  Renal/GU negative Renal ROS  negative genitourinary   Musculoskeletal negative musculoskeletal ROS (+)   Abdominal (+) + obese,   Peds  Hematology negative hematology ROS (+)   Anesthesia Other Findings   Reproductive/Obstetrics (+) Pregnancy                             Anesthesia Physical Anesthesia Plan  ASA: III  Anesthesia Plan: Epidural   Post-op Pain Management:    Induction:   PONV Risk Score and Plan:   Airway Management Planned:   Additional Equipment:   Intra-op Plan:   Post-operative Plan:   Informed Consent: I have reviewed the patients History and Physical, chart, labs and discussed the procedure including the risks, benefits and alternatives for the proposed anesthesia with the patient or authorized representative who has indicated his/her understanding and acceptance.     Plan Discussed with:   Anesthesia Plan Comments:         Anesthesia Quick Evaluation  

## 2017-09-10 NOTE — Progress Notes (Signed)
Patient teary eyed and asking for pain medicine. C/O pain in "whole abdomen", cramping.

## 2017-09-10 NOTE — Progress Notes (Signed)
Pt seen and examined.  FB is out.  BP 106/69   Pulse 76   Temp 98.3 F (36.8 C) (Oral)   Resp 16   Ht 5\' 5"  (1.651 m)   Wt 134.7 kg   LMP 11/29/2016   BMI 49.42 kg/m  Toco: irregular q2-8 min EFM: Cat 1 SVE: 4/60/soft/posterior/-3, AROm small clear fluid  A&P: G2P0010 @ 6445w5d w IOL for post term pregnancy Cont pitocin Epidural upon request SCDs gbs neg / fsr

## 2017-09-10 NOTE — Anesthesia Procedure Notes (Addendum)
Epidural Patient location during procedure: OB Start time: 09/10/2017 5:20 PM End time: 09/10/2017 5:23 PM  Staffing Anesthesiologist: Leilani AbleHatchett, Uriel Horkey, MD Performed: anesthesiologist   Preanesthetic Checklist Completed: patient identified, site marked, surgical consent, pre-op evaluation, timeout performed, IV checked, risks and benefits discussed and monitors and equipment checked  Epidural Patient position: sitting Prep: site prepped and draped and DuraPrep Patient monitoring: continuous pulse ox and blood pressure Approach: midline Location: L3-L4 Injection technique: LOR air  Needle:  Needle type: Tuohy  Needle gauge: 17 G Needle length: 9 cm and 9 Needle insertion depth: 7 cm Catheter type: closed end flexible Catheter size: 19 Gauge Catheter at skin depth: 12 cm Test dose: negative and Other  Assessment Sensory level: T9 Events: blood not aspirated, injection not painful, no injection resistance, negative IV test and no paresthesia  Additional Notes Reason for block:procedure for pain

## 2017-09-11 ENCOUNTER — Encounter (HOSPITAL_COMMUNITY): Payer: Self-pay

## 2017-09-11 MED ORDER — DIBUCAINE 1 % RE OINT: 1.0000 "application " | TOPICAL_OINTMENT | RECTAL | Status: DC | PRN

## 2017-09-11 MED ORDER — OXYCODONE HCL 5 MG PO TABS
5.0000 mg | ORAL_TABLET | ORAL | Status: DC | PRN
  Administered 2017-09-12: 5 mg via ORAL
  Filled 2017-09-11: qty 1

## 2017-09-11 MED ORDER — ONDANSETRON HCL 4 MG/2ML IJ SOLN: 4.0000 mg | INTRAMUSCULAR | Status: DC | PRN

## 2017-09-11 MED ORDER — OXYTOCIN 40 UNITS IN LACTATED RINGERS INFUSION - SIMPLE MED: 1.0000 m[IU]/min | INTRAVENOUS | Status: DC

## 2017-09-11 MED ORDER — BENZOCAINE-MENTHOL 20-0.5 % EX AERO
1.0000 "application " | INHALATION_SPRAY | CUTANEOUS | Status: DC | PRN
  Administered 2017-09-11: 1 via TOPICAL
  Filled 2017-09-11: qty 56

## 2017-09-11 MED ORDER — IBUPROFEN 600 MG PO TABS
600.0000 mg | ORAL_TABLET | Freq: Four times a day (QID) | ORAL | Status: DC
  Administered 2017-09-11 – 2017-09-13 (×8): 600 mg via ORAL
  Filled 2017-09-11 (×8): qty 1

## 2017-09-11 MED ORDER — PRENATAL MULTIVITAMIN CH
1.0000 | ORAL_TABLET | Freq: Every day | ORAL | Status: DC
  Administered 2017-09-11 – 2017-09-13 (×3): 1 via ORAL
  Filled 2017-09-11 (×3): qty 1

## 2017-09-11 MED ORDER — COCONUT OIL OIL: 1.0000 "application " | TOPICAL_OIL | Status: DC | PRN

## 2017-09-11 MED ORDER — OXYCODONE HCL 5 MG PO TABS: 10.0000 mg | ORAL_TABLET | ORAL | Status: DC | PRN

## 2017-09-11 MED ORDER — PANTOPRAZOLE SODIUM 40 MG PO TBEC
40.0000 mg | DELAYED_RELEASE_TABLET | Freq: Every day | ORAL | Status: DC
  Filled 2017-09-11: qty 1

## 2017-09-11 MED ORDER — DIPHENHYDRAMINE HCL 25 MG PO CAPS: 25.0000 mg | ORAL_CAPSULE | Freq: Four times a day (QID) | ORAL | Status: DC | PRN

## 2017-09-11 MED ORDER — TETANUS-DIPHTH-ACELL PERTUSSIS 5-2.5-18.5 LF-MCG/0.5 IM SUSP: 0.5000 mL | Freq: Once | INTRAMUSCULAR | Status: DC

## 2017-09-11 MED ORDER — SIMETHICONE 80 MG PO CHEW: 80.0000 mg | CHEWABLE_TABLET | ORAL | Status: DC | PRN

## 2017-09-11 MED ORDER — ACETAMINOPHEN 325 MG PO TABS
650.0000 mg | ORAL_TABLET | ORAL | Status: DC | PRN
  Administered 2017-09-12: 650 mg via ORAL
  Filled 2017-09-11: qty 2

## 2017-09-11 MED ORDER — ONDANSETRON HCL 4 MG PO TABS
4.0000 mg | ORAL_TABLET | ORAL | Status: DC | PRN
  Administered 2017-09-12: 4 mg via ORAL
  Filled 2017-09-11: qty 1

## 2017-09-11 MED ORDER — WITCH HAZEL-GLYCERIN EX PADS: 1.0000 "application " | MEDICATED_PAD | CUTANEOUS | Status: DC | PRN

## 2017-09-11 MED ORDER — SENNOSIDES-DOCUSATE SODIUM 8.6-50 MG PO TABS
2.0000 | ORAL_TABLET | ORAL | Status: DC
  Administered 2017-09-11 – 2017-09-12 (×2): 2 via ORAL
  Filled 2017-09-11 (×2): qty 2

## 2017-09-11 NOTE — Lactation Note (Signed)
This note was copied from a baby's chart. Lactation Consultation Note  Patient Name: Haley Washington YNWGN'FToday's Date: 09/11/2017 Reason for consult: Initial assessment;Primapara;1st time breastfeeding;Term  10 hours old FT female who is being exclusively BF by her mother, she's a P1. [facility] and has 2 Spectra DEBP at home. When [facility] with hand expression, unable to get colostrum at this point, tried both breasts. Mom also voiced that hand expression has been painful/uncomfortable; she's probably experiencing transient soreness, both nipples still looking intact with no signs of trauma. Mom's nipples are almost flat and semi-compressible, RN had already set her up with breast shells. Mom wore them with her nursing bra during the daytime.  Mom was trying to latch baby on when entering the room, LC assisted with latch but baby just kept pulling off the breast. When she was crying noticed that her tongue does not pass the gum line. LC did some suck training and baby's sucking pattern was uncoordinated, he'd mostly bite if sucking at all, he would do it on and off. Tried both breast in football position, he'd get the nipple and areola complex into his mouth but unable to sustain the latch, an attempt was documented in Flowsheets.  Spoke to mom about trying a NS and also pumping, she was willing to do both. Set mom up with  DEBP, pump instructions, cleaning and storage were reviewed. Mom was sized with a NS # 20 but unable to assess the feeding, baby fell asleep, asked mom to call for assistance the next time baby is ready to feed.  Encouraged mom to feed baby STS 8-12 times/24 hours or sooner if feeding cues are present. Mom will be pumping every 3 hours and if able to express colostrum will spoon or finger feed baby any amount she may get. BF brochure, BF resources and feeding diary were reviewed, both parents aware of LC services and will call PRN.  Maternal Data Formula  Feeding for Exclusion: No Has patient been taught Hand Expression?: Yes Does the patient have breastfeeding experience prior to this delivery?: No  Feeding    Interventions Interventions: Breast feeding basics reviewed;Assisted with latch;Skin to skin;Breast massage;Hand express;Breast compression;Adjust position;DEBP;Shells;Support pillows;Position options  Lactation Tools Discussed/Used Tools: Shells;Pump;Nipple Shields Nipple shield size: 20 Shell Type: Inverted Breast pump type: Double-Electric Breast Pump WIC Program: No Pump Review: Setup, frequency, and cleaning Initiated by:: MPeck Date initiated:: 09/11/17   Consult Status Consult Status: Follow-up Date: 09/12/17 Follow-up type: In-patient    Evolette Pendell Venetia ConstableS Yazan Gatling 09/11/2017, 8:13 PM

## 2017-09-11 NOTE — Anesthesia Postprocedure Evaluation (Signed)
Anesthesia Post Note  Patient: Haley RiegerKristen E Washington  Procedure(s) Performed: AN AD HOC LABOR EPIDURAL     Patient location during evaluation: Mother Baby Anesthesia Type: Epidural Level of consciousness: awake and alert and oriented Pain management: satisfactory to patient Vital Signs Assessment: post-procedure vital signs reviewed and stable Respiratory status: respiratory function stable Cardiovascular status: stable Postop Assessment: no headache, no backache, epidural receding, patient able to bend at knees, no signs of nausea or vomiting and adequate PO intake Anesthetic complications: no    Last Vitals:  Vitals:   09/11/17 1216 09/11/17 1350  BP: 108/76 118/74  Pulse: (!) 139 (!) 108  Resp:  20  Temp:  36.8 C    Last Pain:  Vitals:   09/11/17 1350  TempSrc: Oral  PainSc: 0-No pain   Pain Goal: Patients Stated Pain Goal: 6 (09/10/17 0118)               Karleen DolphinFUSSELL,Dorise Gangi

## 2017-09-11 NOTE — Progress Notes (Signed)
Patient comfortable with epidural.  Not feeling pressure.    BP 106/73   Pulse (!) 114   Temp 98.5 F (36.9 C) (Oral)   Resp 16   Ht 5\' 5"  (1.651 m)   Wt 134.7 kg   LMP 11/29/2016   BMI 49.42 kg/m  Toco: q2-3 min EFM: 130s, mod var, Cat 1 SVE: 10/100/-1  A&P:   Had a protracted active phase to get to 8 cm, but appropriate progression from 8 to 10cm.  Minimal caput at this time.  Patient is not feeling any pressure.  Will allow another hour before starting to push.  FSR

## 2017-09-12 LAB — CBC
HEMATOCRIT: 26.5 % — AB (ref 36.0–46.0)
Hemoglobin: 8.6 g/dL — ABNORMAL LOW (ref 12.0–15.0)
MCH: 28.6 pg (ref 26.0–34.0)
MCHC: 32.5 g/dL (ref 30.0–36.0)
MCV: 88 fL (ref 78.0–100.0)
Platelets: 136 10*3/uL — ABNORMAL LOW (ref 150–400)
RBC: 3.01 MIL/uL — ABNORMAL LOW (ref 3.87–5.11)
RDW: 15.6 % — AB (ref 11.5–15.5)
WBC: 12 10*3/uL — ABNORMAL HIGH (ref 4.0–10.5)

## 2017-09-12 NOTE — Lactation Note (Addendum)
This note was copied from a baby's chart. Lactation Consultation Note  Patient Name: Haley Washington Date: 09/12/2017 Reason for consult: Follow-up assessment;Primapara;1st time breastfeeding;Difficult latch;Term  Baby is 28 1/2 hours old and is having a challenge with feeding.  MBURN - Melissa Queen called LC into to assess feeding.  As LC entered the room, baby was very fussy, LC tried to calm baby down. Had mom  Hold her and dipping a gloved finger in formula worked on baby to suck. alittle less fussy.  Then tried finger feeding with a curved tip syringe and was able to get the baby to take 3 ml.  Progressed to finger feeding and was able to get the baby to take 12 ml. Total feeding with curved tip and 32F SNS finger feeding was 15 ml. Baby tolerated well and was able to pull the milk down out of the syringe.  LC tried to latch the baby afterwards with and without a NS and the baby was sleepy.  LC resized mom for #24 NS after pre-pumping and the #24 NS fit better. The #20 NS fits, but to snug and then is difficult to remove. This LC recommends not using the #20 NS and the #24 NS only if pre-pumping occurs 1st.   LC feels moms tissue, ( Nipple/ areola complex needs some work due to edema)  LC stressed to mom the importance of wearing the breast shells between feedings Except when sleeping.  And to due to the baby being so fussy - finger feed a few feedings until the baby is calmer.  Feed with feeding cues and not to go over 3 hours without feeding.  Post pumping at least every 3 hours to increase the hormones for lactation and to work on the breast tissue, also with the shells.   LC also recommends if on Monday there is a planned D/C , to recommend the baby is a Baby pt. To work on the breast feeding.   Mom asked LC  is it possible she won't make milk.  LC asked mom how her breast changes were during her pregnancy, mom responded  My areolas got darker and the areola got larger,  and breast were very sensitive 1st trimester. My breast are so large I didn't notice a change in size.   LC reviewed supply and demand and the importance of the 1st 2 weeks of breast feeding and stimulation whether its the baby or pumping.  LC stressed it is a "Wait and see " situation. Stimulation, STS and working on the latching is the key.  Mom has had irregular periods, and it was discussed about potential PCOS, but never was given the diagnosis.  Dad present at consult and discussion.    MBURN aware of consult findings and 7-11P LC aware of the DL and challenges.    Maternal Data Has patient been taught Hand Expression?: Yes(LC reviewed )  Feeding Feeding Type: Formula Length of feed: (attempted , DL )  LATCH Score Latch: Too sleepy or reluctant, no latch achieved, no sucking elicited.(baby to fussy to latch )  Audible Swallowing: None  Type of Nipple: Flat  Comfort (Breast/Nipple): Soft / non-tender  Hold (Positioning): Assistance needed to correctly position infant at breast and maintain latch.  LATCH Score: 4  Interventions Interventions: Breast feeding basics reviewed;Shells;Hand pump;DEBP  Lactation Tools Discussed/Used Tools: Shells;Pump Nipple shield size: 20;24;Other (comment)(#20 NS to tight, #24 NS to loose , may be different after prepumping ) Shell Type: Inverted Breast  pump type: Double-Electric Breast Pump;Manual   Consult Status Consult Status: Follow-up Date: 09/12/17 Follow-up type: In-patient    Haley Washington 09/12/2017, 6:48 PM

## 2017-09-12 NOTE — Lactation Note (Addendum)
This note was copied from a baby's chart. Lactation Consultation Note  Patient Name: Haley Gilford RileKristen Koplin ZOXWR'UToday's Date: 09/12/2017 Reason for consult: Mother's request;Follow-up assessment;Difficult latch P1. 19 hours old female infant  W/ nose congestion and emesis "spitty" LC asked mom hand express  but no colostrum is present at this time. Per mom she saw changes in breast in pregnancy. Mom has flat nipples and was  fitted w/ 20 mm NS. Infant latched w/ difficulty on left breast using football hold, no swallows heard at this time nor colostrum present in NS, See flow docs. Mom decided to  supplement breastfeeding  w/formula at this time,  based on infant's age of  19 hours,  infant took 8 mls  of (similac w/ iron  ) on curve tip syringe . LC demonstrated to mom how to use DEBP for breast induction and stimulation.  Mom will continue to hand express for stimulation and do STS. Mom knows to pump q3h for 15-20 min. Mom will continue to work towards latching infant to breast  w/ NS.   Maternal Data    Feeding Feeding Type: Breast Fed Length of feed: 0 min  LATCH Score Latch: Grasps breast easily, tongue down, lips flanged, rhythmical sucking.  Audible Swallowing: None  Type of Nipple: Flat  Comfort (Breast/Nipple): Soft / non-tender  Hold (Positioning): Full assist, staff holds infant at breast  LATCH Score: 5  Interventions Interventions: Support pillows;Position options;Adjust position;Breast compression  Lactation Tools Discussed/Used Tools: Nipple Dorris CarnesShields   Consult Status Consult Status: Follow-up Date: 09/12/17 Follow-up type: In-patient    Danelle EarthlyRobin Elsbeth Yearick 09/12/2017, 5:47 AM

## 2017-09-12 NOTE — Progress Notes (Signed)
Patient is doing well.  She is ambulating, voiding, tolerating PO.  Pain control is good.  Lochia is appropriate Very anxious about baby and breastfeeding  Vitals:   09/11/17 1830 09/11/17 2133 09/12/17 0312 09/12/17 0523  BP: 112/72 117/71 124/65 (!) 109/59  Pulse: 97 99 90 98  Resp: 18 18 18 18   Temp: 97.8 F (36.6 C) 97.8 F (36.6 C) (!) 97.3 F (36.3 C) 97.6 F (36.4 C)  TempSrc: Oral Oral Oral Oral  Weight:      Height:        NAD Fundus firm Ext: 2+ edema b/l  Lab Results  Component Value Date   WBC 12.0 (H) 09/12/2017   HGB 8.6 (L) 09/12/2017   HCT 26.5 (L) 09/12/2017   MCV 88.0 09/12/2017   PLT 136 (L) 09/12/2017    --/--/O POS (08/23 0101)/RI  A/P 32 y.o. G2P1011 PPD#1 s/p TSVD. Routine care.   Encouraged to continue to ask for assistance w BF from RN/LC Expect d/c tomorrow.    [facility] [employer]

## 2017-09-13 NOTE — Lactation Note (Signed)
This note was copied from a baby's chart. Lactation Consultation Note  Patient Name: Haley Gilford RileKristen Washington ZOXWR'UToday's Date: 09/13/2017 Reason for consult: Follow-up assessment   Baby 55 hours old and FOB called to request lactation to come to room for questions. FOB wanted baby to be evaluated for a tongue tie.  Baby was sleeping.  Suggest discussing with Ped MD. During consult mother was called for a Lactation OP appt and mother told them she would call back. Offered to assist with latch when baby cues and mother declined assistance.   Encouraged mother to pump q 2.5-3 hours until latching is improving.    Maternal Data    Feeding    LATCH Score                   Interventions    Lactation Tools Discussed/Used     Consult Status Consult Status: Complete    Hardie PulleyBerkelhammer, Cesare Sumlin Boschen 09/13/2017, 5:14 PM

## 2017-09-13 NOTE — Lactation Note (Addendum)
This note was copied from a baby's chart. Lactation Consultation Note  Patient Name: Haley Washington ZOXWR'UToday's Date: 09/13/2017 Reason for consult: Follow-up assessment   Mother reports that she really wants to breastfeed. Mother has not attempt to breastfeed infant since early morning. Mother was fit with a #24 nipple shield. She was also sat up with a DEBP. Mother reports that she has a Spectra pump at home. Discussed mothers breastfeeding goals .   Mother reports that she does want to breastfeed.  Lots of teaching on supply and demand. Taught hand expression and observed tiny drops of colostrum.  Mother advised to offer breast with all feeding cues.  Advised mother to breastfeed infant 8-12 times in 24 hours.   Assist mother with latching on the left breast.  Placed #24 NS on. Mothers nipples semi-flat but compressible.  Infant had been fed 45ml of formula with a bottle the last feeding.  Formula placed in the tip of the nipple shield. Infant on and off of the shield.  No sustained latch. Attempt to latch for 15-20 mins.  Mother advised in treatment and prevention of engorgement.  Encouraged mother to pump every 2-3 hours for 15-20 mins to protect milk supply. Mother has supplemental guidelines and reviewed with mother to give adequate amts of ebm/formula.   Discussed cluster feeding.   Advised [facility]. Entered mother/baby infor in basket to get a phone call to schedule appt.  Mother is aware of all available services.     Maternal Data    Feeding Feeding Type: Formula Nipple Type: Slow - flow Length of feed: 20 min(on and off no sustained latch., few sucks )  LATCH Score Latch: Repeated attempts needed to sustain latch, nipple held in mouth throughout feeding, stimulation needed to elicit sucking reflex.  Audible Swallowing: A few with stimulation  Type of Nipple: Flat  Comfort (Breast/Nipple): Soft / non-tender  Hold  (Positioning): Full assist, staff holds infant at breast  LATCH Score: 5  Interventions Interventions: Assisted with latch;Skin to skin;Breast massage;Hand express;Breast compression;Adjust position;Support pillows;Position options;Expressed milk;Hand pump;DEBP  Lactation Tools Discussed/Used Tools: Nipple Shields Nipple shield size: 24   Consult Status Consult Status: Follow-up Follow-up type: Out-patient    Haley BornKendrick, Haley Washington 09/13/2017, 2:54 PM  patient Name: Haley Washington Date: 09/13/2017 Reason for consult: Follow-up assessment   Mother reports that she really wants to breastfeed. Mother has not attempt to breastfeed infant since early morning. Mother was fit with a #24 nipple shield. She was also sat up with a DEBP. Mother reports that she has a Spectra  pump at home. Discussed mothers breastfeeding goals .   Mother reports that she does want to breastfeed.  Lots of teaching on supply and demand. Taught hand expression and observed tiny drops of colostrum.  Mother advised to offer breast with all feeding cues.  Advised mother to breastfeed infant 8-12 times in 24 hours.   Assist mother with latching on the left breast.  Placed #24 NS on. Mothers nipples semi-flat but compressible.  Infant had been fed 45ml of formula with a bottle the last feeding.  Formula placed in the tip of the nipple shield. Infant on and off of the shield.  No sustained latch. Attempt to latch for 15-20 mins.  Mother advised in treatment and prevention of engorgement.  Encouraged mother to pump every 2-3 hours for 15-20 mins to protect milk supply. Mother has supplemental guidelines and reviewed with mother to give adequate amts of ebm/formula.   Discussed cluster feeding.   Advised mother to follow up with [facility] outpatient services. Entered mother/baby infor in basket to get a phone call to schedule appt.  Mother is aware of all available services.     Maternal Data    Feeding Feeding Type: Formula Nipple Type: Slow - flow Length of feed: 20 min(on and off no sustained latch., few sucks )  LATCH Score Latch: Repeated attempts needed to sustain latch, nipple held in mouth throughout feeding, stimulation needed to elicit sucking reflex.  Audible Swallowing: A few with stimulation  Type of Nipple: Flat  Comfort (Breast/Nipple): Soft / non-tender  Hold (Positioning): Full assist, staff holds infant at breast  LATCH Score: 5  Interventions Interventions: Assisted with latch;Skin to skin;Breast massage;Hand express;Breast compression;Adjust position;Support pillows;Position options;Expressed milk;Hand pump;DEBP  Lactation Tools Discussed/Used Tools: Nipple Shields Nipple shield size: 24   Consult Status Consult Status: Follow-up Follow-up type:  Out-patient    Haley Washington [facility] 09/13/2017, 2:54 PM

## 2017-09-13 NOTE — Discharge Summary (Signed)
Obstetric Discharge Summary Reason for Admission: induction of labor Prenatal Procedures: ultrasound Intrapartum Procedures: spontaneous vaginal delivery Postpartum Procedures: none Complications-Operative and Postpartum: vaginal laceration Hemoglobin  Date Value Ref Range Status  09/12/2017 8.6 (L) 12.0 - 15.0 g/dL Final   HCT  Date Value Ref Range Status  09/12/2017 26.5 (L) 36.0 - 46.0 % Final    Physical Exam:  General: alert and cooperative Lochia: appropriate Uterine Fundus: firm  DVT Evaluation: No evidence of DVT seen on physical exam.  Discharge Diagnoses: Term Pregnancy-delivered  Discharge Information: Date: 09/13/2017 Activity: pelvic rest Diet: routine Medications: PNV and Ibuprofen. FeSO4 bid Condition: stable Instructions: refer to practice specific booklet Discharge to: home Follow-up Information    Marlow Baarslark, Dyanna, MD Follow up in 4 week(s).   Specialty:  Obstetrics Contact information: [address] [place] [id number] [phone]           Newborn Data: Live born female  Birth Weight: 8 lb 3.6 oz (3731 g) APGAR: 7, 9  Newborn Delivery   Birth date/time:  09/[birth year]/2017 10:06:00 Delivery type:  Vaginal, Spontaneous     Home with mother.  Philip AspenCALLAHAN, Reeves Musick 09/13/2017, 10:19 AM

## 2017-09-17 ENCOUNTER — Ambulatory Visit: Payer: Self-pay

## 2017-09-17 NOTE — Lactation Note (Signed)
This note was copied from a baby's chart. 09/17/2017  Name: Haley Washington MRN: [id number] Date of Birth: 09/11/2017 Gestational Age: Gestational Age: [redacted]w[redacted]d Birth Weight: 131.6 oz Weight today:    8 pounds 9.1 ounces (3886 grams) with clean newborn diaper  Infant presents today with mom and dad for feeding assessment.   Infant has gained 291 grams in the last 4 days with an average daily weight gain of 73 grams a day.   Mom is having difficulty latching infant to the breast. Mom with very large breasts and has difficulty supporting breasts and latching infant. Areolas are thick and are difficult to compress and infant not able to latch on the nipple without the nipple shield. Nipple also inverts when NS is applied at times. Worked on positioning, supporting breast and hands on pumping.   Mom has pumped some and has gotten 1/2-4 ounces. She is pumping with Spectra 2 pump. She was able to pump 1/2 ounce post feeding today. Reviewed supply and demand and milk coming to volume. Enc mom to increase breast emptying to promote milk supply. Information on Fenugreek given, advised mom to speak with her OB prior to taking.   Attempted to latch infant to the breast with  #20 and # 24 NS and with and without the 5 french feeding tube. Infant would not suckle at the breast. Dad finished feeding with a bottle.   Enc mom to hold infant STS and to attempt BF every day. Enc mom to try with and without the NS and with and without the 5 french feeding tube.   Infant to follow up with Dr. Gerda Diss on Sept 3. Mom aware of BF Support Groups at [facility]. Mom will call and reschedule with Lactation as needed.   Enc mom to take one day at a time and to work on her milk supply. Enc mom to call with any questions/concerns.     General Information: Mother's reason for visit: Feeding assessment, check milk supply Consult: Initial Lactation consultant: Haley Stain Washington,Haley Washington Breastfeeding experience: not latching  currently   Maternal medications: Other, Pre-natal vitamin(Cetirizine, Probiotic, Glucosamine)  Breastfeeding History: Frequency of breast feeding: 0 Duration of feeding: 0  Supplementation: Supplement method: bottle([employer]vent) Brand: Similac Formula volume: 2-3 ounces Formula frequency: every 2-3 hours         Pump type: Spectra Pump frequency: 3 x yesterday Pump volume: 1/2-4 ounces  Infant Output Assessment: Voids per 24 hours: 10 Urine color: Clear yellow Stools per 24 hours: 6-7 Stool color: Yellow  Breast Assessment: Breast: Soft, Compressible Nipple: Flat, Inverted, Scabs Pain level: 4 Pain interventions: Bra, Lanolin  Feeding Assessment: Infant oral assessment: WNL   Positioning: Cross cradle(left breast) Latch: 0 - Too sleepy or reluctant, no latch achieved, no sucking elicited. Audible swallowing: 0 - None Type of nipple: 1 - Flat Comfort: 1 - Filling, red/small blisters or bruises, mild/mod discomfort Hold: 1 - Assistance needed to correctly position infant at breast and maintain latch LATCH score: 3 Latch assessment: Shallow Lips flanged: Yes   Tools: Nipple shield 24 mm, Nipple shield 20 mm Pre-feed weight: 3886 grams Post feed weight: 3886 grams Amount transferred: 0 Amount supplemented: 90 ml Formula  Additional Feeding Assessment:                                    Totals: Total amount transferred: 0 Total supplement given: 90 ml formula Total amount pumped  post feed: 15 ml   Plan:  1. Offer infant the breast as often as mom and infant wants, the more she practices, the better she will be 2. Use the # 24 Nipple Shield with feeding as needed 3. Can try to supplement at the breast with the 5 french feeding tube with formula or breast milk in the syringe 4. If infant does not supplement at the breast offer her a bottle of breast milk or formula afterwards 5. Use a slower flow nipple such as Dr. Theora GianottiBrown's or Medela nipple 6. Use  the paced bottle feeding method to feed infant (video on [employer]) 7. Infant needs 71-95 ml (2.5-3 ounces) for 8 feedings a day or 570-760 ml (19-25 ounces) in 24 hours. She may eat more or less per feeding depending on how often she feeds 8. To promote and protect milk supply it is recommended that you pump to empty the breast 8-12 x a day. Massage/compress breast with pumping. Hand express after pumping to empty breast Double electric breast pump is the preferred method for 15-20 minutes. 9. Consider taking Fenugreek if ok with youir OB. Dosage for milk production is 2-3 610 mg capsules 3 times a day 10. Keep up the good work 11. Thank you for allowing me to assist you today 12. Please call with any questions/concerns as needed [phone] 13. Follow up with Lactation as needed  Haley Washington, Haley Washington                                                     Haley Washington 09/17/2017, 1:33 PM

## 2017-09-29 ENCOUNTER — Ambulatory Visit: Payer: PRIVATE HEALTH INSURANCE | Admitting: Family

## 2017-09-29 ENCOUNTER — Encounter: Payer: Self-pay | Admitting: Family

## 2017-09-29 ENCOUNTER — Ambulatory Visit (HOSPITAL_BASED_OUTPATIENT_CLINIC_OR_DEPARTMENT_OTHER)
Admission: RE | Admit: 2017-09-29 | Discharge: 2017-09-29 | Disposition: A | Discharge status: Home / Self Care | Payer: PRIVATE HEALTH INSURANCE | Source: Ambulatory Visit | Attending: Family | Admitting: Family

## 2017-09-29 VITALS — BP 113/64 | HR 73 | Temp 98.2°F | Resp 16 | Ht 64.0 in | Wt 262.6 lb

## 2017-09-29 DIAGNOSIS — Z23 Encounter for immunization: Secondary | ICD-10-CM

## 2017-09-29 DIAGNOSIS — M7989 Other specified soft tissue disorders: Secondary | ICD-10-CM | POA: Insufficient documentation

## 2017-09-29 DIAGNOSIS — K6289 Other specified diseases of anus and rectum: Secondary | ICD-10-CM | POA: Diagnosis not present

## 2017-09-29 DIAGNOSIS — M549 Dorsalgia, unspecified: Secondary | ICD-10-CM | POA: Diagnosis not present

## 2017-09-29 NOTE — Progress Notes (Signed)
Subjective:    Patient ID: Haley Washington, female    DOB: Mar 17, 1985, 32 y.o.   MRN: [id number]  HPI  Patient is a 32 year old female who presents today with chief complaint of back pain.  She reports the pain is located in the left upper back.  Pain began while she was in the hospital following the birth of her daughter.  She delivered her daughter on 09/11/2017.  She had vaginal lacerations from her vaginal delivery which were sutured.  She reports that she continues to have some rectal pain following her delivery. Denies calf pain/swelling.  Denies SOB    Reports that the pain is worse with lifting and sitting. She has been alternating tylenol and motrin.  Notes no significant improvement in the last few weeks despite these measures.  Rectal pain-  Gyn recommended stool softeners.    Review of Systems See HPI  Past Medical History:  Diagnosis Date  . Acne    has been on aldactone in the past.  Also tried Accutane without improvement in her symptoms   . Allergy   . History of chicken pox   . Hx: UTI (urinary tract infection)   . Hyperlipidemia   . Low back pain 2017   improved with physical therapy  . Morbid obesity with BMI of 40.0-44.9, adult [facility])      Social History   Socioeconomic History  . Marital status: Married    Spouse name: Vicente Serene  . Number of children: Not on file  . Years of education: Not on file  . Highest education level: Not on file  Occupational History  . Not on file  Social Needs  . Financial resource strain: Not hard at all  . Food insecurity:    Worry: Never true    Inability: Never true  . Transportation needs:    Medical: No    Non-medical: No  Tobacco Use  . Smoking status: Never Smoker  . Smokeless tobacco: Never Used  Substance and Sexual Activity  . Alcohol use: Yes    Comment: rarely  . Drug use: No  . Sexual activity: Yes    Partners: Male    Birth control/protection: None    Comment: natural family planning (NFP)  Lifestyle   . Physical activity:    Days per week: 0 days    Minutes per session: 0 min  . Stress: Not at all  Relationships  . Social connections:    Talks on phone: Not on file    Gets together: Not on file    Attends religious service: Not on file    Active member of club or organization: Not on file    Attends meetings of clubs or organizations: Not on file    Relationship status: Not on file  . Intimate partner violence:    Fear of current or ex partner: Not on file    Emotionally abused: Not on file    Physically abused: Not on file    Forced sexual activity: Not on file  Other Topics Concern  . 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 ADENOIDECTOMY    . DILATION AND EVACUATION N/A 10/27/2016   Procedure: DILATATION AND EVACUATION;  Surgeon: Waynard Reeds, MD;  Location: [facility] BIRTHING SUITES;  Service: Gynecology;  Laterality: N/A;    Family History  Problem Relation Age of Onset  . Hyperlipidemia Mother   . Hypertension Mother   . Obesity Mother   . Alcohol abuse Father        recurring  . Obesity Brother   . Mental illness Maternal Grandmother        anxiety  . Arthritis Maternal Grandfather   . Cancer Maternal Grandfather        colon, diagnosed in 63's  . Diabetes Maternal Grandfather   . Heart disease Paternal Grandfather   . Cancer Paternal Grandfather        lymphoma, ? lung CA- smoker    Allergies  Allergen Reactions  . Lactose Intolerance (Gi) Other (See Comments)    GI upset    Current Outpatient Medications on File Prior to Visit  Medication Sig Dispense Refill  . cetirizine (ZYRTEC) 10 MG tablet Take 10 mg by mouth daily.    Tery Sanfilippo Calcium (STOOL SOFTENER PO) Take 3 capsules by mouth daily.    Marland Kitchen FENUGREEK PO Take 3 capsules by mouth 3 (three) times  daily.    Marland Kitchen GLUCOSAMINE-CHONDROITIN PO Take 2 tablets by mouth daily.     . mometasone (NASONEX) 50 MCG/ACT nasal spray Use one to two sprays in each nostril once daily as directed. (Patient taking differently: Place 2 sprays into the nose every other day. Use one to two sprays in each nostril once daily as directed.) 51 g 0  . Prenatal Vit-Fe Fumarate-FA (PRENATAL VITAMIN PO) Take 1 tablet by mouth daily.    . Probiotic Product (PROBIOTIC DAILY PO) Take 1 capsule by mouth daily.     No current facility-administered medications on file prior to visit.     BP 113/64 (BP Location: Right Arm, Cuff Size: Large)   Pulse 73   Temp 98.2 F (36.8 C) (Oral)   Resp 16   Ht 5\' 4"  (1.626 m)   Wt 262 lb 9.6 oz (119.1 kg)   LMP 11/29/2016   SpO2 99%   BMI 45.08 kg/m       Objective:   Physical Exam  Constitutional: She appears well-developed and well-nourished.  Cardiovascular: Normal rate, regular rhythm and normal heart sounds.  No murmur heard. Pulmonary/Chest: Effort normal and breath sounds normal. No respiratory distress. She has no wheezes.  Musculoskeletal:  2+ LLE edema Mild tenderness to palpation overlying left upper scapula  Psychiatric: She has a normal mood and affect. Her behavior is normal. Judgment and thought content normal.          Assessment & Plan:  LLE swelling-she reports that she does have some chronic left lower extremity edema.  However given her postpartum status need to rule out DVT.  Obtain LLE doppler  Left sided back pain- likely musculoskeletal.  Advised pt to continue Tylenol and Motrin and to let us know if symptoms are not improved in 2 weeks.   Rectal pain-likely related to recent childbirth.  She denies hemorrhoids.  We discussed continuing stool softener high-fiber diet and plenty of water.  We also discussed that she can try using Tucks pads for comfort.  She is advised to keep her upcoming appointment with her OB/GYN.

## 2017-09-29 NOTE — Patient Instructions (Signed)
Please return to the imaging department on the first floor for a 1:30 leg ultrasound to rule out clot. You may continue ibuprofen and Tylenol as needed for left upper back pain.  Please call if you develop chest pain shortness of breath or worsening pain.  You may continue to use Tylenol or Motrin as needed for pain.  Please let us know if your back pain is not improved in 2 weeks.  Continue stool softeners and a high-fiber diet as well as plenty of water.  You may apply Tucks pads externally to the rectal area as needed for comfort.  Please keep her upcoming postpartum check appointment with your OB/GYN.

## 2017-10-25 ENCOUNTER — Encounter: Payer: PRIVATE HEALTH INSURANCE | Admitting: Family

## 2017-11-25 ENCOUNTER — Telehealth: Payer: Self-pay

## 2017-11-25 NOTE — Telephone Encounter (Signed)
Copied from [place] [phone]. Topic: General - Inquiry >> Nov 24, 2017  1:04 PM Scherrie Gerlach wrote: Reason for CRM: pt is on staff at [employer] [facility] [employer].  Pt has been on maternity leave while the [facility] came on site and were giving MMR vaccines due to the recent outbreak of the mumps. They want pt to get the MMR now that she is back at work.  Advised her to call her pcp to get the vaccine. Is this ok for pt to schedule?

## 2017-11-26 NOTE — Telephone Encounter (Signed)
Nurse visit scheduled for 12/02/17

## 2017-11-26 NOTE — Telephone Encounter (Signed)
Okay to schedule nurse visit at her convenience.  

## 2017-11-26 NOTE — Telephone Encounter (Signed)
Yes please

## 2017-11-30 ENCOUNTER — Encounter: Payer: Self-pay | Admitting: Family Medicine

## 2017-11-30 ENCOUNTER — Ambulatory Visit: Payer: PRIVATE HEALTH INSURANCE | Admitting: Family Medicine

## 2017-11-30 VITALS — BP 114/86 | Ht 64.0 in | Wt 259.0 lb

## 2017-11-30 DIAGNOSIS — I8392 Asymptomatic varicose veins of left lower extremity: Secondary | ICD-10-CM

## 2017-11-30 NOTE — Progress Notes (Signed)
   Subjective:    Patient ID: Haley Washington, female    DOB: 01-31-1985, 32 y.o.   MRN: [id number]  HPI Patient is here today to establish care with [place]. She does not have a history of any illnesses. She does want to discuss her immunizations. New patient Overall good health Denies any postpartum depression Does have varicose veins which bother her on the left leg not causing any pain or discomfort currently Otherwise has good health in the past Mirena in Sept Review of Systems  Constitutional: Negative for activity change, fatigue and fever.  HENT: Negative for congestion and rhinorrhea.   Respiratory: Negative for cough, chest tightness and shortness of breath.   Cardiovascular: Negative for chest pain and leg swelling.  Gastrointestinal: Negative for abdominal pain and nausea.  Skin: Negative for color change.  Neurological: Negative for dizziness and headaches.  Psychiatric/Behavioral: Negative for agitation and behavioral problems.       Objective:   Physical Exam  Constitutional: She appears well-developed and well-nourished.  HENT:  Head: Normocephalic.  Cardiovascular: Normal rate, regular rhythm and normal heart sounds.  No murmur heard. Pulmonary/Chest: Effort normal and breath sounds normal.  Neurological: She is alert.  Skin: Skin is warm and dry.  Psychiatric: She has a normal mood and affect.  Vitals reviewed.   Large varicose veins on the left leg no thrombosis no redness no tenderness      Assessment & Plan:  Varicose veins Patient will think about referral to vascular surgery No need to act on this right away Healthy diet regular physical activity recommended for weight Patient to follow-up on a as needed basis Gets her women's health check 3 of her gynecologist

## 2017-12-02 ENCOUNTER — Ambulatory Visit (INDEPENDENT_AMBULATORY_CARE_PROVIDER_SITE_OTHER): Payer: PRIVATE HEALTH INSURANCE

## 2017-12-02 DIAGNOSIS — Z23 Encounter for immunization: Secondary | ICD-10-CM

## 2017-12-22 ENCOUNTER — Ambulatory Visit (INDEPENDENT_AMBULATORY_CARE_PROVIDER_SITE_OTHER): Payer: PRIVATE HEALTH INSURANCE | Admitting: Family Medicine

## 2017-12-22 ENCOUNTER — Encounter: Payer: Self-pay | Admitting: Family Medicine

## 2017-12-22 VITALS — Temp 98.0°F | Ht 64.0 in | Wt 264.0 lb

## 2017-12-22 DIAGNOSIS — B309 Viral conjunctivitis, unspecified: Secondary | ICD-10-CM | POA: Diagnosis not present

## 2017-12-22 MED ORDER — SULFACETAMIDE SODIUM 10 % OP SOLN: 2.0000 [drp] | Freq: Four times a day (QID) | OPHTHALMIC | 0 refills | Status: DC

## 2017-12-22 NOTE — Progress Notes (Signed)
   Subjective:    Patient ID: Alice RiegerKristen E Krueger, female    DOB: 06-11-1985, 32 y.o.   MRN: [id number]  HPIright eye irritation. Some eye drainage. Started yesterday. Used warm cloth, sulfactamide drops and ofloxacin drops.  Right eye drainage some crusting sticking together left eye not doing that no runny nose congestion cough denies high fever chills sweats   Review of Systems See above    Objective:   Physical Exam Eardrums are normal throat is normal Neck no masses lungs clear no crackles respiratory rate normal right eye conjunctivitis is noted with some crusting       Assessment & Plan:  Conjunctivitis more than likely viral but could have secondary component recommend the eyedrops next several days warning signs discussed follow-up if problems

## 2017-12-23 ENCOUNTER — Encounter

## 2017-12-23 ENCOUNTER — Ambulatory Visit: Payer: PRIVATE HEALTH INSURANCE | Admitting: Medical

## 2018-01-25 ENCOUNTER — Ambulatory Visit (INDEPENDENT_AMBULATORY_CARE_PROVIDER_SITE_OTHER): Payer: PRIVATE HEALTH INSURANCE | Admitting: Family Medicine

## 2018-01-25 ENCOUNTER — Encounter: Payer: Self-pay | Admitting: Family Medicine

## 2018-01-25 VITALS — BP 120/88 | Temp 98.3°F | Ht 64.0 in | Wt 264.0 lb

## 2018-01-25 DIAGNOSIS — Z131 Encounter for screening for diabetes mellitus: Secondary | ICD-10-CM

## 2018-01-25 DIAGNOSIS — J019 Acute sinusitis, unspecified: Secondary | ICD-10-CM | POA: Diagnosis not present

## 2018-01-25 DIAGNOSIS — R300 Dysuria: Secondary | ICD-10-CM | POA: Diagnosis not present

## 2018-01-25 DIAGNOSIS — R42 Dizziness and giddiness: Secondary | ICD-10-CM | POA: Diagnosis not present

## 2018-01-25 DIAGNOSIS — L659 Nonscarring hair loss, unspecified: Secondary | ICD-10-CM | POA: Diagnosis not present

## 2018-01-25 DIAGNOSIS — Z13220 Encounter for screening for lipoid disorders: Secondary | ICD-10-CM

## 2018-01-25 DIAGNOSIS — I890 Lymphedema, not elsewhere classified: Secondary | ICD-10-CM | POA: Insufficient documentation

## 2018-01-25 DIAGNOSIS — R600 Localized edema: Secondary | ICD-10-CM | POA: Insufficient documentation

## 2018-01-25 LAB — POCT URINALYSIS DIPSTICK
Blood, UA: NEGATIVE
SPEC GRAV UA: 1.025 (ref 1.010–1.025)
pH, UA: 5 (ref 5.0–8.0)

## 2018-01-25 MED ORDER — AMOXICILLIN 500 MG PO TABS: 500.0000 mg | ORAL_TABLET | Freq: Three times a day (TID) | ORAL | 0 refills | Status: DC

## 2018-01-25 NOTE — Progress Notes (Signed)
   Subjective:    Patient ID: Haley Washington, female    DOB: [birth year], 33 y.o.   MRN: [id number]  HPI  Patient is here today with complaints of dizziness/vertigo off and on since Dec 30,2019.  She states this has now developed into a headache. She does have some congestion in head. No fever. Has been taking mucinex,tylenol. This patient relates some intermittent lightheadedness feels like she is unsteady but denies any spinning of the room denies blurred vision double vision nausea vomiting She also relates some mild headache sinus congestion drainage coughing PMH benign also gets a little unsteady when she bends over and stands up Review of Systems  Constitutional: Negative for activity change and fever.  HENT: Positive for congestion and rhinorrhea. Negative for ear pain.   Eyes: Negative for discharge.  Respiratory: Positive for cough. Negative for shortness of breath and wheezing.   Cardiovascular: Negative for chest pain.  Neurological: Positive for dizziness, light-headedness and headaches.       Objective:   Physical Exam Vitals signs and nursing note reviewed.  Constitutional:      Appearance: She is well-developed.  HENT:     Head: Normocephalic.     Nose: Nose normal.     Mouth/Throat:     Pharynx: No oropharyngeal exudate.  Neck:     Musculoskeletal: Neck supple.  Cardiovascular:     Rate and Rhythm: Normal rate.     Heart sounds: Normal heart sounds. No murmur.  Pulmonary:     Effort: Pulmonary effort is normal.     Breath sounds: Normal breath sounds. No wheezing.  Lymphadenopathy:     Cervical: No cervical adenopathy.  Skin:    General: Skin is warm and dry.    She does have significant edema worse in the left leg than the right leg.  She had had a previous injury years ago to the left leg cause some permanent swelling issues   Results for orders placed or performed in visit on 01/25/18  POCT urinalysis dipstick  Result Value Ref Range   Color, UA     Clarity, UA     Glucose, UA     Bilirubin, UA     Ketones, UA     Spec Grav, UA 1.025 1.010 - 1.025   Blood, UA Negative    pH, UA 5.0 5.0 - 8.0   Protein, UA     Urobilinogen, UA     Nitrite, UA     Leukocytes, UA Trace (A) Negative   Appearance     Odor         Assessment & Plan:  Urinalysis negative for protein Probable sinusitis antibiotic prescribed warning signs discussed Dizziness more than likely related to the sinuses I do not feel the patient has a neurologic issue her finger-to-nose was normal Romberg normal no nystagmus.  No ataxia.  Recommend close monitoring of blood pressure follow-up in 6 weeks to recheck blood pressure watch diet closely exercise.  If blood pressure worsens or if swelling in the legs worsen will recommend diuretic  Left leg pedal edema some right leg pedal edema will use knee-high surgical support hose in addition to this if not doing better by the time she follows up consider diuretic  Patient encouraged to eat healthy regular exercise try to lose weight

## 2018-01-26 NOTE — Progress Notes (Signed)
Blood work ordered in Epic. Patient notified. 

## 2018-01-26 NOTE — Addendum Note (Signed)
Addended by: Margaretha Sheffield on: 01/26/2018 11:06 AM   Modules accepted: Orders

## 2018-01-30 LAB — LIPID PANEL
CHOL/HDL RATIO: 3.6 ratio (ref 0.0–4.4)
Cholesterol, Total: 186 mg/dL (ref 100–199)
HDL: 52 mg/dL (ref >39)
LDL CALC: 116 mg/dL — AB (ref 0–99)
TRIGLYCERIDES: 92 mg/dL (ref 0–149)
VLDL CHOLESTEROL CAL: 18 mg/dL (ref 5–40)

## 2018-01-30 LAB — T4, FREE: Free T4: 1.11 ng/dL (ref 0.82–1.77)

## 2018-01-30 LAB — TSH: TSH: 2.3 u[IU]/mL (ref 0.450–4.500)

## 2018-01-30 LAB — GLUCOSE, RANDOM: Glucose: 84 mg/dL (ref 65–99)

## 2018-03-01 ENCOUNTER — Encounter: Payer: Self-pay | Admitting: Family Medicine

## 2018-03-01 ENCOUNTER — Ambulatory Visit: Payer: PRIVATE HEALTH INSURANCE | Admitting: Family Medicine

## 2018-03-01 VITALS — BP 118/74 | Temp 98.9°F | Ht 64.0 in | Wt 262.0 lb

## 2018-03-01 DIAGNOSIS — J111 Influenza due to unidentified influenza virus with other respiratory manifestations: Secondary | ICD-10-CM

## 2018-03-01 MED ORDER — OSELTAMIVIR PHOSPHATE 75 MG PO CAPS: ORAL_CAPSULE | ORAL | 0 refills | Status: DC

## 2018-03-01 NOTE — Progress Notes (Signed)
   Subjective:    Patient ID: Haley RiegerKristen E Washington, female    DOB: 1985-08-15, 33 y.o.   MRN: [id number]  Cough  This is a new problem. Episode onset: 2 days. Associated symptoms comments: Cough, sore throat, congestion, body aches, dizziness. Treatments tried: mucinex dm.   Right eye redness and irritation since December. Tried sulfacetamide eye drops.   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        Objective:   Physical Exam  Alert vitals reviewed, moderate malaise. Hydration good. Positive nasal congestion lungs no crackles or wheezes, no tachypnea, intermittent bronchial cough during exam heart regular rate and rhythm.       Assessment & Plan:  Impression influenza discussed at length. Ashby Dawesature of illness and potential sequela discussed. Plan Tamiflu prescribed if indicated and timing appropriate. Symptom care discussed. Warning signs discussed. WSL Rather sudden onset.  36 hours ago.  Will cover with Tamiflu rationale discussed at length potential flu.

## 2018-03-09 ENCOUNTER — Ambulatory Visit: Payer: PRIVATE HEALTH INSURANCE | Admitting: Family Medicine

## 2018-08-10 ENCOUNTER — Encounter: Payer: Self-pay | Admitting: Family Medicine

## 2018-09-20 ENCOUNTER — Other Ambulatory Visit: Payer: Self-pay

## 2018-09-20 DIAGNOSIS — Z20822 Contact with and (suspected) exposure to covid-19: Secondary | ICD-10-CM

## 2018-09-22 LAB — NOVEL CORONAVIRUS, NAA: SARS-CoV-2, NAA: NOT DETECTED

## 2019-01-11 ENCOUNTER — Other Ambulatory Visit: Payer: Self-pay

## 2019-01-11 ENCOUNTER — Ambulatory Visit: Payer: PRIVATE HEALTH INSURANCE | Attending: Internal Medicine

## 2019-01-11 DIAGNOSIS — Z20822 Contact with and (suspected) exposure to covid-19: Secondary | ICD-10-CM

## 2019-01-12 ENCOUNTER — Encounter: Payer: Self-pay | Admitting: Family Medicine

## 2019-01-12 ENCOUNTER — Ambulatory Visit (INDEPENDENT_AMBULATORY_CARE_PROVIDER_SITE_OTHER): Payer: PRIVATE HEALTH INSURANCE | Admitting: Family Medicine

## 2019-01-12 DIAGNOSIS — B349 Viral infection, unspecified: Secondary | ICD-10-CM

## 2019-01-12 LAB — NOVEL CORONAVIRUS, NAA: SARS-CoV-2, NAA: NOT DETECTED

## 2019-01-12 NOTE — Telephone Encounter (Signed)
Hi Eleen  So it is possible to have false negatives.  Based upon your symptoms of fatigue and sore throat I would remain socially isolated/socially distant from others as well as wearing a mask through the weekend.  If your symptoms are worsening as you had in early next week you should consider retesting  It is possible this could be just a viral illness that is not Covid but out of an abundance of caution I would not recommend going to any gatherings for the holidays through the weekend  If you would like to do a virtual visit that certainly can be done thank you

## 2019-01-12 NOTE — Progress Notes (Signed)
   Subjective:    Patient ID: Haley Washington, female    DOB: [birth year], 33 y.o.   MRN: [id number]  Sore Throat  This is a new problem. The current episode started in the past 7 days. Associated symptoms include congestion and coughing. Pertinent negatives include no ear pain or shortness of breath. Associated symptoms comments: fatigue.   Patient relates a little bit of a sore throat she also relates some congestion denies wheezing difficulty breathing she did go and have a Covid test which was negative but she is aware of the possibility of a false negative she denies fever denies severe headache body aches she does relate a lot of fatigue and tiredness covid test negative  Review of Systems  Constitutional: Negative for activity change and fever.  HENT: Positive for congestion and rhinorrhea. Negative for ear pain.   Eyes: Negative for discharge.  Respiratory: Positive for cough. Negative for shortness of breath and wheezing.   Cardiovascular: Negative for chest pain.   Virtual Visit via Video Note  I connected with SUJEY GUNDRY on 01/12/19 at 11:00 AM EST by a video enabled telemedicine application and verified that I am speaking with the correct person using two identifiers.  Location: Patient: home Provider: office   I discussed the limitations of evaluation and management by telemedicine and the availability of in person appointments. The patient expressed understanding and agreed to proceed.  History of Present Illness:    Observations/Objective:   Assessment and Plan:   Follow Up Instructions:    I discussed the assessment and treatment plan with the patient. The patient was provided an opportunity to ask questions and all were answered. The patient agreed with the plan and demonstrated an understanding of the instructions.   The patient was advised to call back or seek an in-person evaluation if the symptoms worsen or if the condition fails to improve as  anticipated.  I provided 15 minutes of non-face-to-face time during this encounter.         Objective:   Physical Exam  Patient had virtual visit Appears to be in no distress Atraumatic Neuro able to relate and oriented No apparent resp distress Color normal       Assessment & Plan:  Viral syndrome There is a possibility of Covid although the likelihood is not super high I do think it is wise for her to stay away from others until she is well and if her symptoms worsen over the next few days I recommend that she consider going for additional Covid testing warning signs discussed with patient

## 2019-01-12 NOTE — Telephone Encounter (Signed)
Patient scheduled virtual visit today with Dr Scott 

## 2019-02-08 ENCOUNTER — Encounter: Payer: Self-pay | Admitting: Family Medicine

## 2019-02-08 ENCOUNTER — Ambulatory Visit (INDEPENDENT_AMBULATORY_CARE_PROVIDER_SITE_OTHER): Payer: PRIVATE HEALTH INSURANCE | Admitting: Family Medicine

## 2019-02-08 ENCOUNTER — Other Ambulatory Visit: Payer: Self-pay

## 2019-02-08 ENCOUNTER — Ambulatory Visit: Payer: PRIVATE HEALTH INSURANCE | Attending: Internal Medicine

## 2019-02-08 DIAGNOSIS — Z20822 Contact with and (suspected) exposure to covid-19: Secondary | ICD-10-CM

## 2019-02-08 DIAGNOSIS — B349 Viral infection, unspecified: Secondary | ICD-10-CM

## 2019-02-08 NOTE — Telephone Encounter (Signed)
Patient scheduled virtual visit today with Dr Scott 

## 2019-02-08 NOTE — Progress Notes (Signed)
   Subjective:    Patient ID: Haley Washington, female    DOB: 26-Jun-1985, 34 y.o.   MRN: [id number]  Cough This is a new problem. Episode onset: 2 days. Associated symptoms include rhinorrhea. Pertinent negatives include no chest pain, ear pain, fever, shortness of breath or wheezing. Associated symptoms comments: Having to clear throat alot. Treatments tried: sudafed sinus, mucinex sinus max.  had covid test done today. Significant cough congestion not feeling good symptoms over the past few days denies high fever chills sweats wheezing difficulty breathing Virtual Visit via Telephone Note  I connected with FAVIOLA KLARE on 02/08/19 at  2:00 PM EST by telephone and verified that I am speaking with the correct person using two identifiers.  Location: Patient: home Provider: office   I discussed the limitations, risks, security and privacy concerns of performing an evaluation and management service by telephone and the availability of in person appointments. I also discussed with the patient that there may be a patient responsible charge related to this service. The patient expressed understanding and agreed to proceed.   History of Present Illness:    Observations/Objective:   Assessment and Plan:   Follow Up Instructions:    I discussed the assessment and treatment plan with the patient. The patient was provided an opportunity to ask questions and all were answered. The patient agreed with the plan and demonstrated an understanding of the instructions.   The patient was advised to call back or seek an in-person evaluation if the symptoms worsen or if the condition fails to improve as anticipated.  I provided 15 minutes of non-face-to-face time during this encounter.       Review of Systems  Constitutional: Negative for activity change and fever.  HENT: Positive for congestion and rhinorrhea. Negative for ear pain.   Eyes: Negative for discharge.  Respiratory: Positive  for cough. Negative for shortness of breath and wheezing.   Cardiovascular: Negative for chest pain.       Objective:   Physical Exam Today's visit was via telephone Physical exam was not possible for this visit        Assessment & Plan:  Viral illness No need for antibiotic Warning signs discussed in detail Stay away from others for now wear a mask when around family members Covid testing pending Stay away from work until test results are back Follow-up if progressive troubles

## 2019-02-09 LAB — NOVEL CORONAVIRUS, NAA: SARS-CoV-2, NAA: NOT DETECTED

## 2019-03-07 ENCOUNTER — Ambulatory Visit: Payer: PRIVATE HEALTH INSURANCE | Attending: Internal Medicine

## 2019-03-07 ENCOUNTER — Encounter: Payer: Self-pay | Admitting: Family Medicine

## 2019-03-07 ENCOUNTER — Telehealth: Payer: Self-pay | Admitting: Family Medicine

## 2019-03-07 ENCOUNTER — Other Ambulatory Visit: Payer: Self-pay

## 2019-03-07 DIAGNOSIS — Z20822 Contact with and (suspected) exposure to covid-19: Secondary | ICD-10-CM

## 2019-03-07 NOTE — Telephone Encounter (Signed)
I spoke with patient via phone Having respiratory symptoms Covid test pending  Please send via MyChart work excuse for the rest of the week due to respiratory illness with Covid concerns and Covid testing pending

## 2019-03-08 LAB — NOVEL CORONAVIRUS, NAA: SARS-CoV-2, NAA: NOT DETECTED

## 2019-03-10 ENCOUNTER — Other Ambulatory Visit: Payer: Self-pay

## 2019-03-10 ENCOUNTER — Ambulatory Visit (INDEPENDENT_AMBULATORY_CARE_PROVIDER_SITE_OTHER): Payer: PRIVATE HEALTH INSURANCE | Admitting: Family Medicine

## 2019-03-10 ENCOUNTER — Encounter: Payer: Self-pay | Admitting: Family Medicine

## 2019-03-10 DIAGNOSIS — J019 Acute sinusitis, unspecified: Secondary | ICD-10-CM | POA: Diagnosis not present

## 2019-03-10 DIAGNOSIS — B9789 Other viral agents as the cause of diseases classified elsewhere: Secondary | ICD-10-CM

## 2019-03-10 MED ORDER — AMOXICILLIN 500 MG PO CAPS: 500.0000 mg | ORAL_CAPSULE | Freq: Three times a day (TID) | ORAL | 0 refills | Status: DC

## 2019-03-10 NOTE — Progress Notes (Signed)
   Subjective:  audio  Patient ID: Haley Washington, female    DOB: 03-14-1985, 34 y.o.   MRN: [id number]  Cough This is a new problem. Episode onset: 6 days. Associated symptoms comments: Congestion, stuffy nose. Treatments tried: dayquil, nyquil, mucinex.   Virtual Visit via Telephone Note  I connected with Haley Washington on 03/10/19 at  1:10 PM EST by telephone and verified that I am speaking with the correct person using two identifiers.  Location: Patient: home Provider: office   I discussed the limitations, risks, security and privacy concerns of performing an evaluation and management service by telephone and the availability of in person appointments. I also discussed with the patient that there may be a patient responsible charge related to this service. The patient expressed understanding and agreed to proceed.   History of Present Illness:    Observations/Objective:   Assessment and Plan:   Follow Up Instructions:    I discussed the assessment and treatment plan with the patient. The patient was provided an opportunity to ask questions and all were answered. The patient agreed with the plan and demonstrated an understanding of the instructions.   The patient was advised to call back or seek an in-person evaluation if the symptoms worsen or if the condition fails to improve as anticipated.  I provided of non-face-to-face time during this encounter.  Pt was home over the weekend  Power was out  pts daughter at daycare  Had cough last fri into sat  Pt developed sore throt sund  By Tuesday, got worse, now getting up some phlegm  Pos grreeen disch  No fever    Review of Systems  Respiratory: Positive for cough.        Objective:   Physical Exam  virt      Assessment & Plan:  Impression post viral rhinosinusitis/bronchitis.  Symptom care discussed.  Warning signs discussed.  Antibiotics prescribed.  Questions answered

## 2019-03-10 NOTE — Telephone Encounter (Signed)
Pt contacted and transferred up front to set up virtual appt

## 2019-05-23 IMAGING — DX DG WRIST COMPLETE 3+V*R*
4 series · 4 of 4 positions shown · non-contrast
Comparison: None.

CLINICAL DATA: Fall 3 weeks ago. Persistent right wrist pain.
Initial encounter.

EXAM:
RIGHT WRIST - COMPLETE 3+ VIEW

[wrist pa]
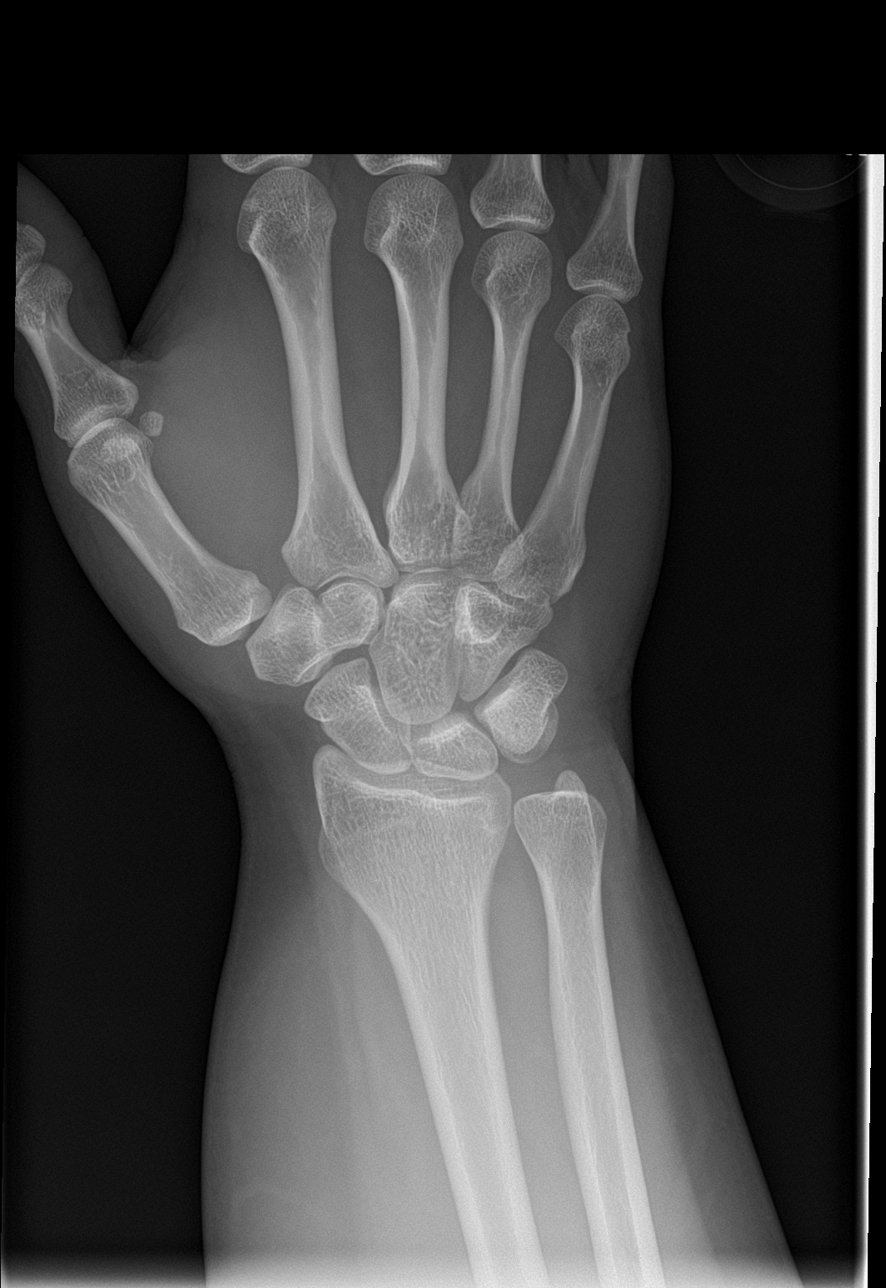

[wrist obl]
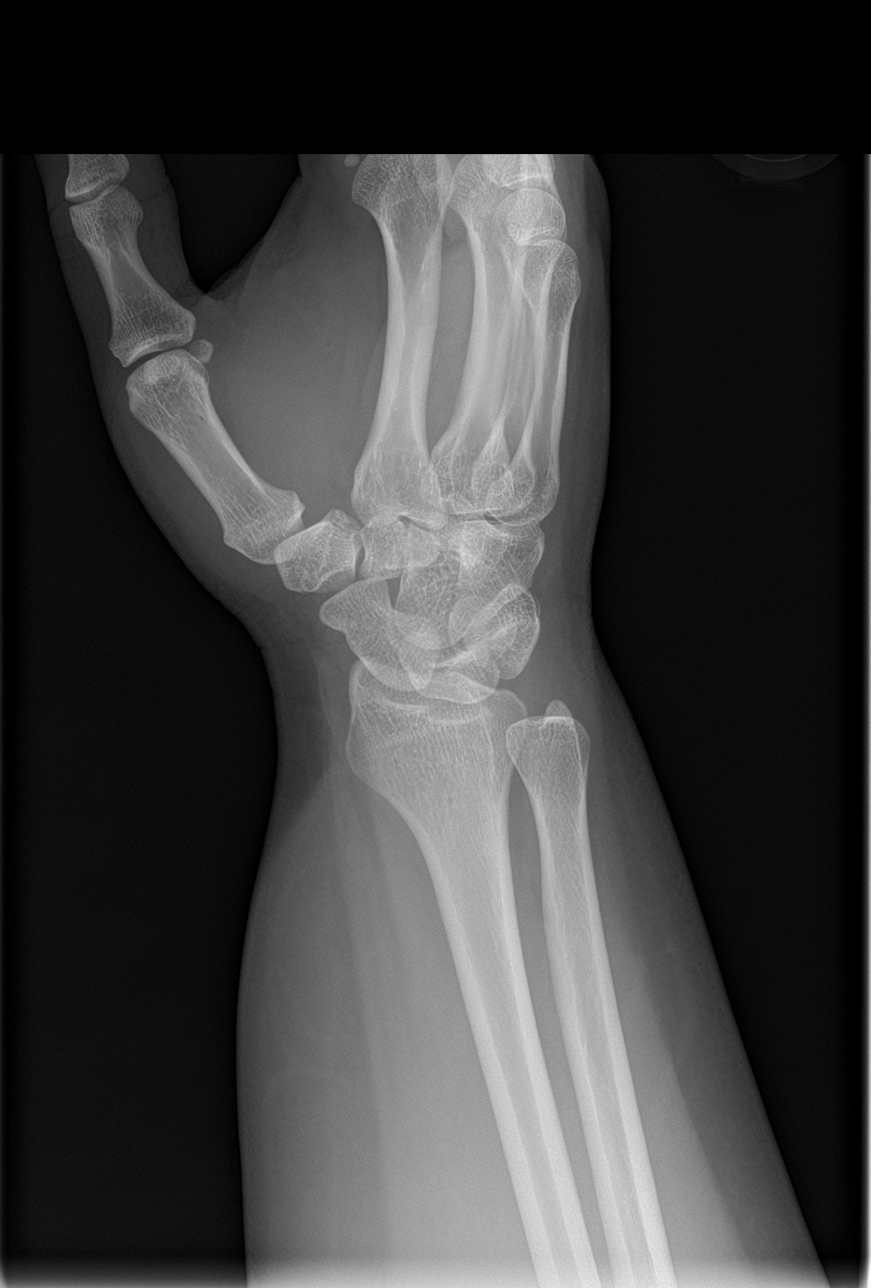

[wrist lat]
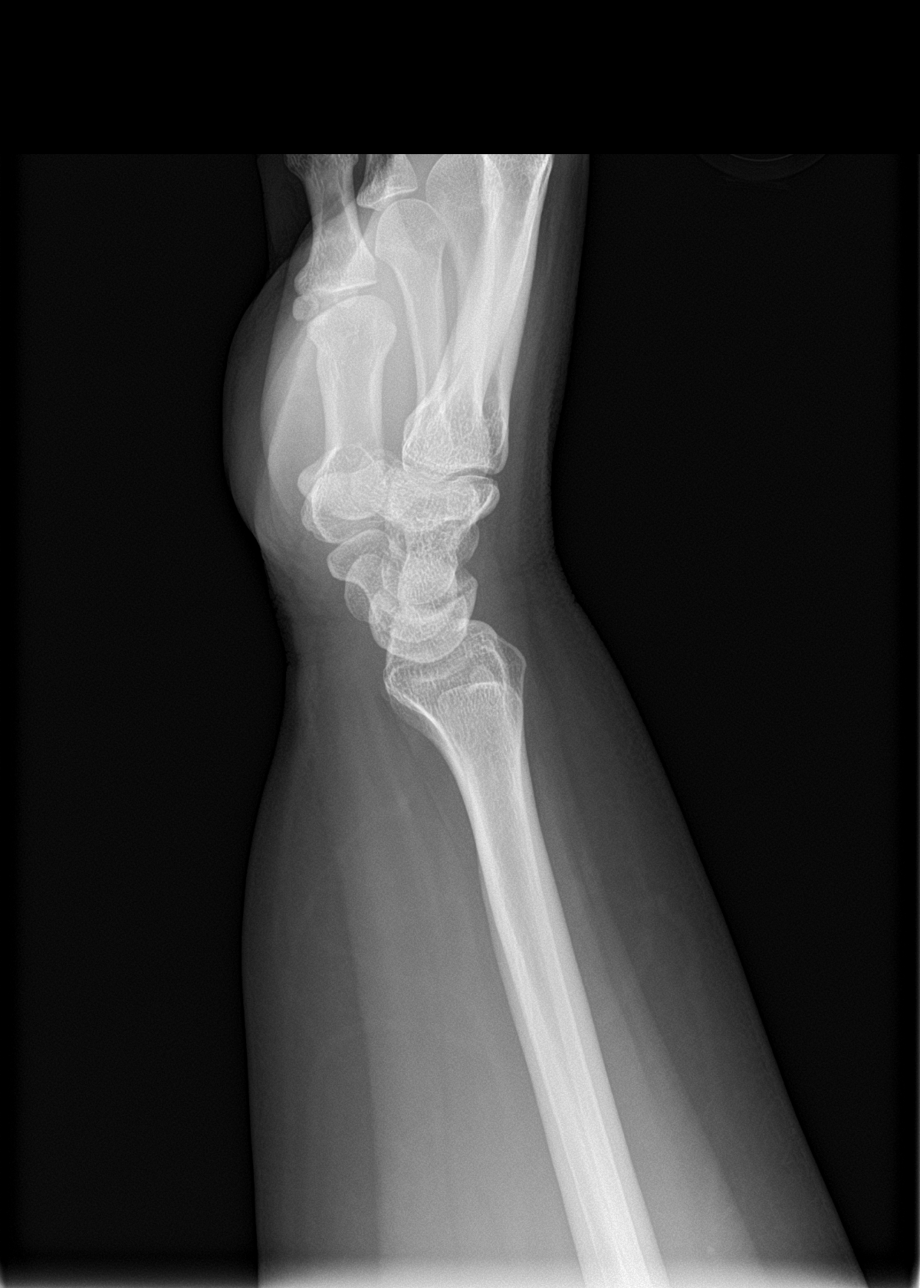

[wrist navicular]
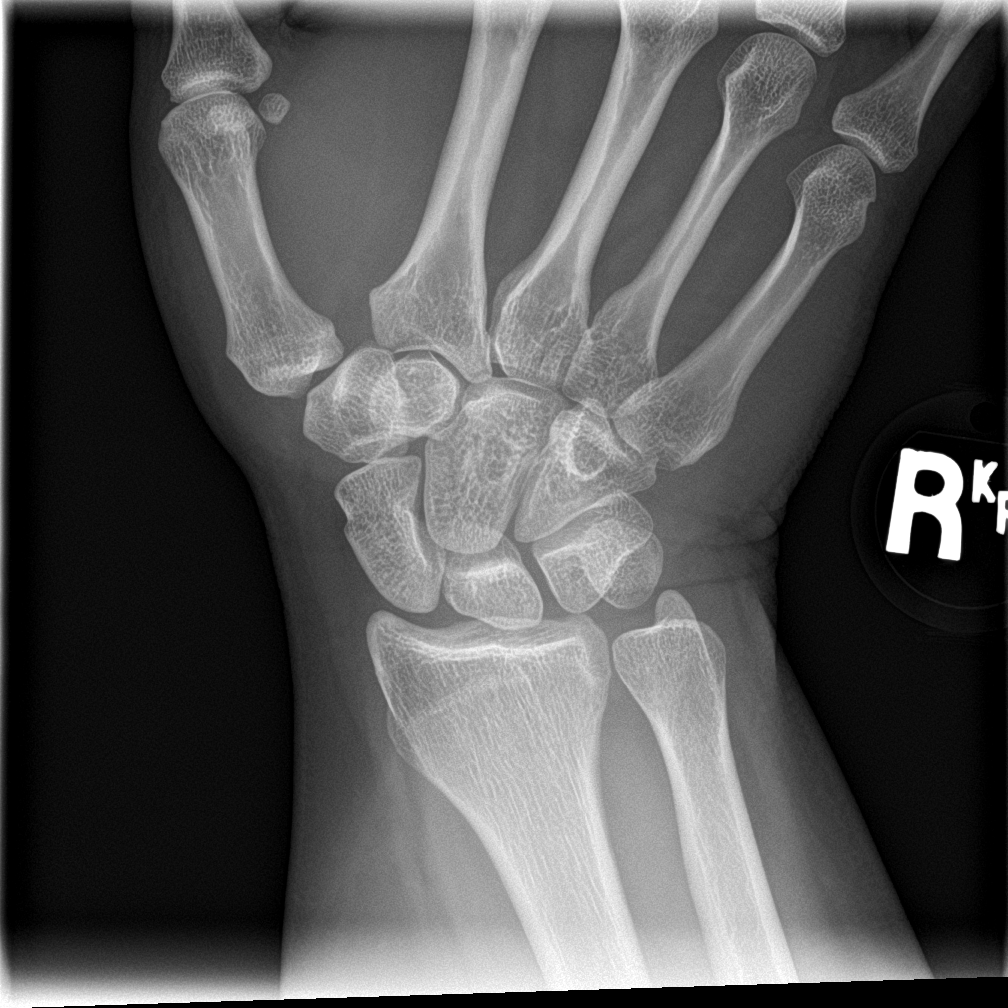

[4 of 4 positions shown; findings below may reference images not displayed]

FINDINGS: There is no evidence of fracture or dislocation. There is no
evidence of arthropathy or other focal bone abnormality. Soft
tissues are unremarkable.
IMPRESSION: Negative.

## 2019-05-23 IMAGING — DX DG KNEE COMPLETE 4+V*R*
4 series · 4 of 4 positions shown · non-contrast
Comparison: None.

CLINICAL DATA: Fall 3 weeks ago. Right knee pain and swelling.
Initial encounter.

EXAM:
RIGHT KNEE - COMPLETE 4+ VIEW

[knee ap]
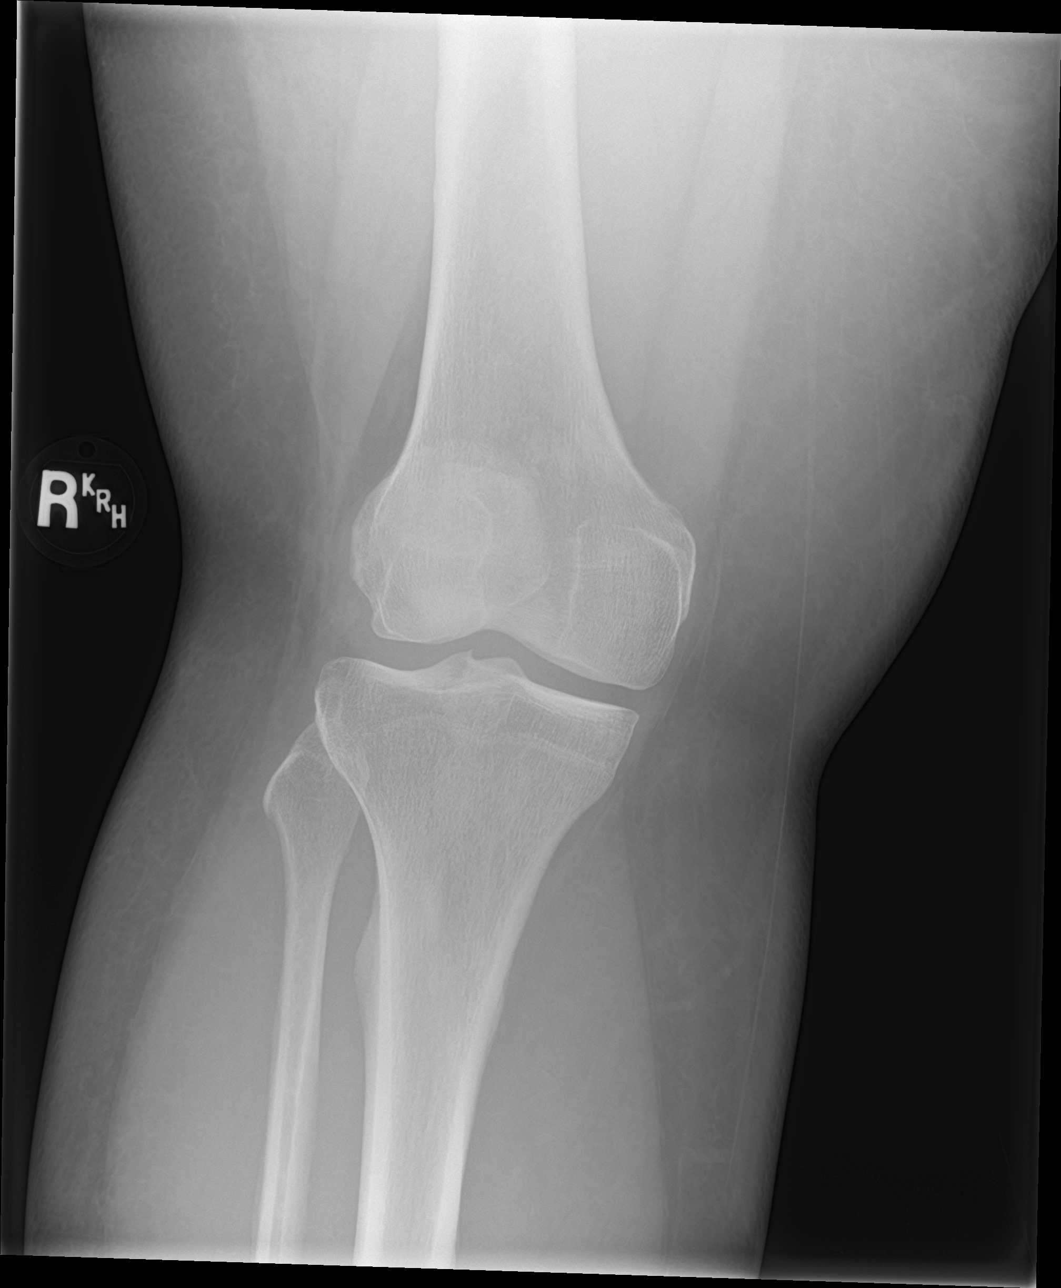

[knee lat]
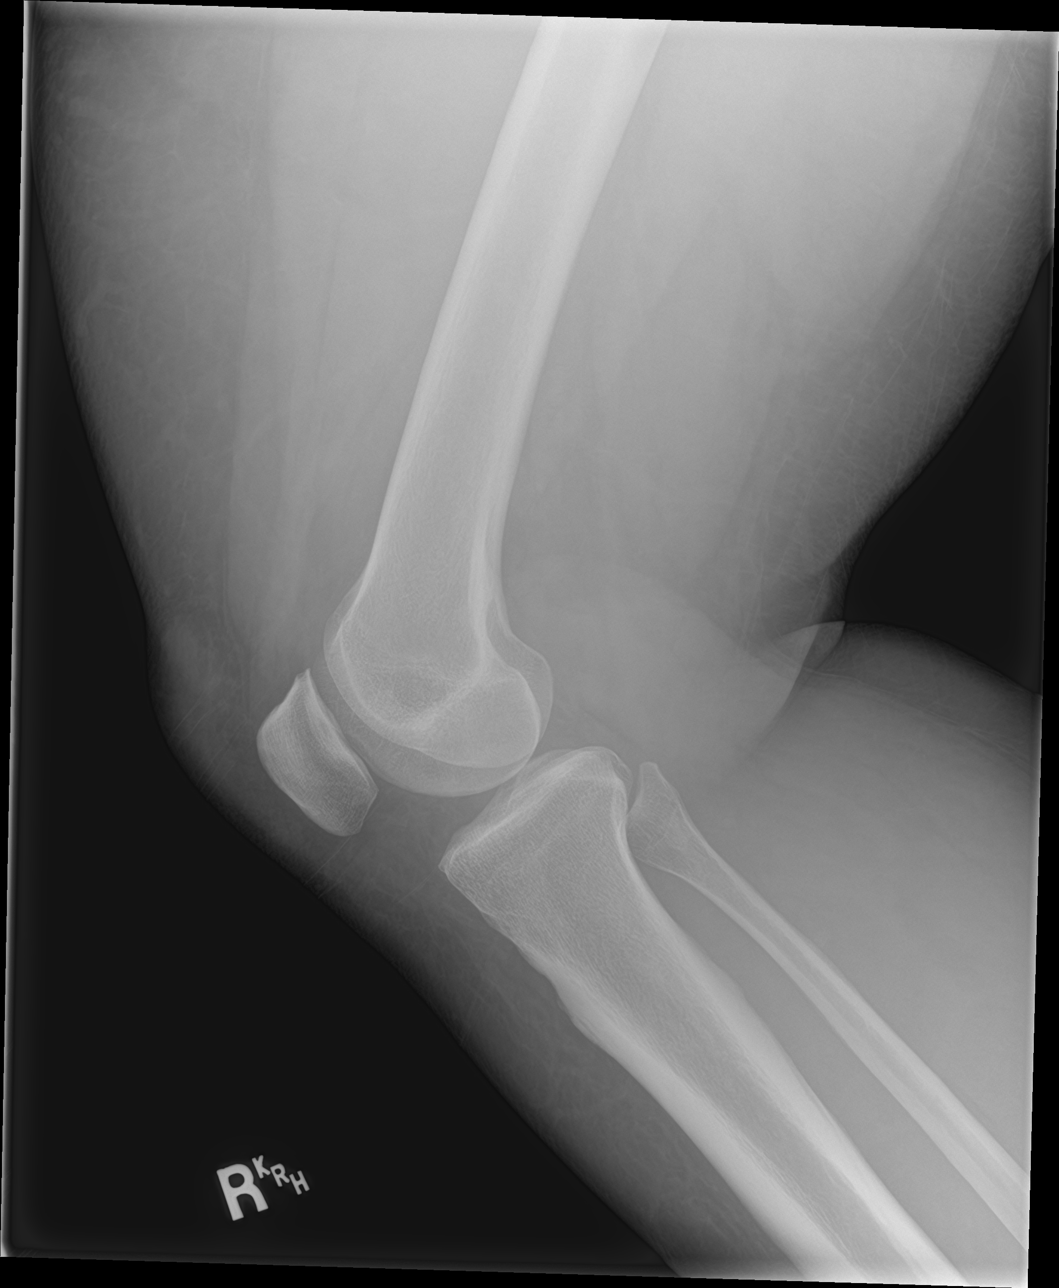

[knee obl (1 of 2)]
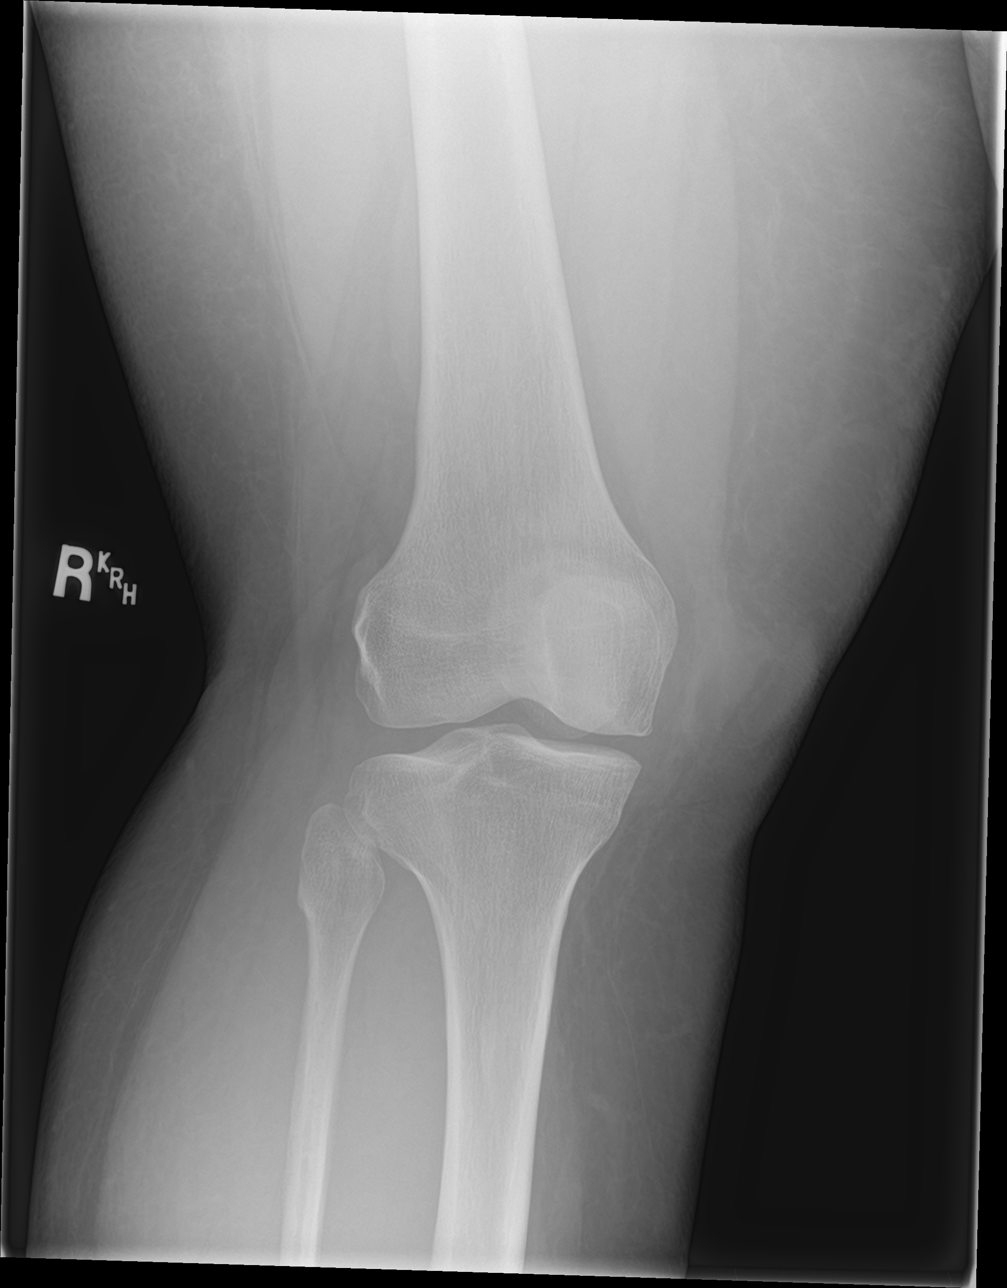

[knee obl (2 of 2)]
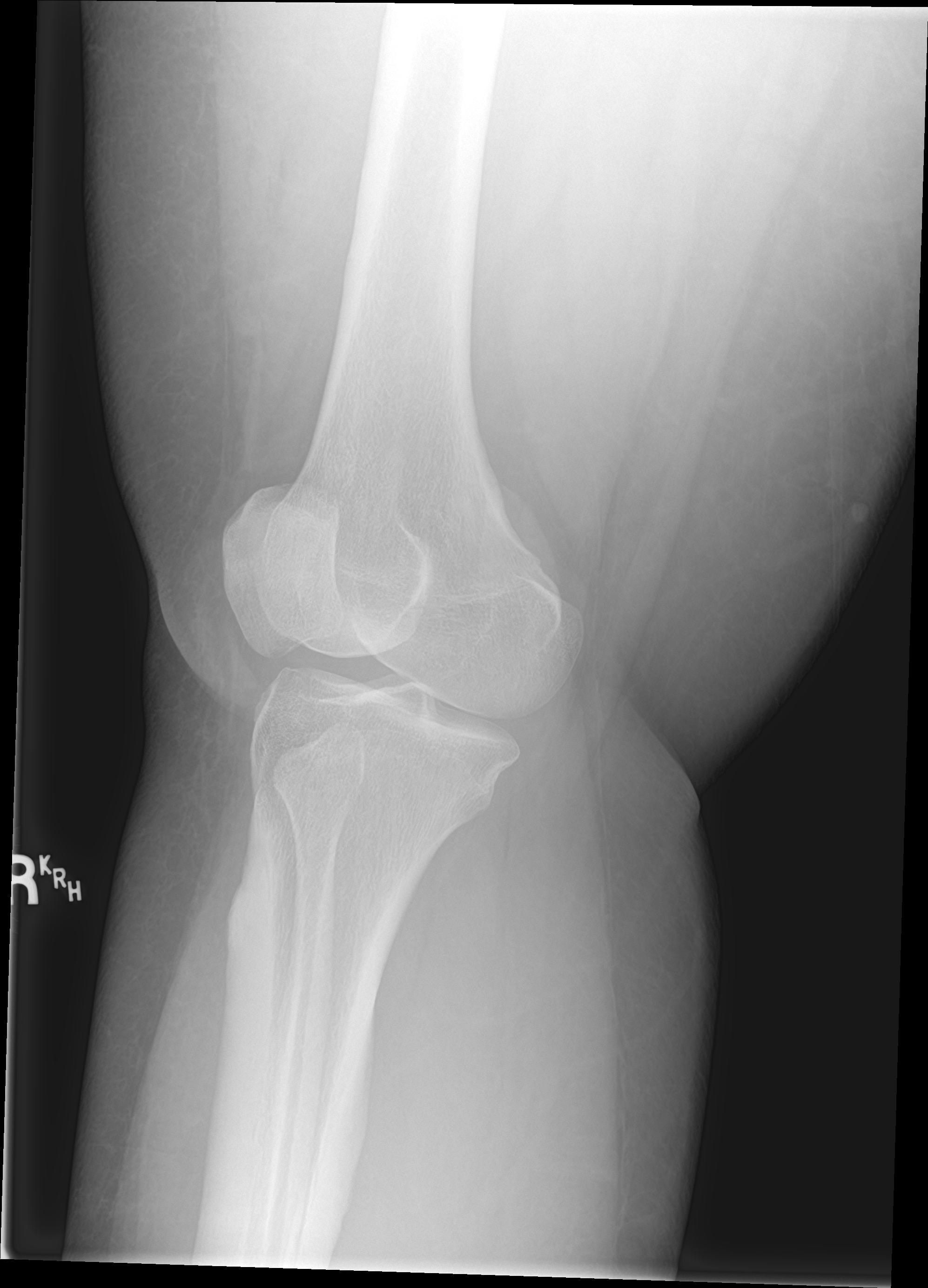

[4 of 4 positions shown; findings below may reference images not displayed]

FINDINGS: No evidence of fracture, dislocation, or joint effusion. No evidence
of arthropathy or other focal bone abnormality. Soft tissues are
unremarkable.
IMPRESSION: Negative.

## 2019-05-30 ENCOUNTER — Encounter: Payer: Self-pay | Admitting: Family Medicine

## 2019-05-30 ENCOUNTER — Ambulatory Visit
Admission: EM | Admit: 2019-05-30 | Discharge: 2019-05-30 | Disposition: A | Discharge status: Home / Self Care | Payer: PRIVATE HEALTH INSURANCE | Attending: Emergency Medicine | Admitting: Emergency Medicine

## 2019-05-30 ENCOUNTER — Other Ambulatory Visit: Payer: Self-pay

## 2019-05-30 DIAGNOSIS — L03116 Cellulitis of left lower limb: Secondary | ICD-10-CM | POA: Diagnosis not present

## 2019-05-30 MED ORDER — DOXYCYCLINE HYCLATE 100 MG PO CAPS: 100.0000 mg | ORAL_CAPSULE | Freq: Two times a day (BID) | ORAL | 0 refills | Status: DC

## 2019-05-30 MED ORDER — MUPIROCIN 2 % EX OINT: 1.0000 "application " | TOPICAL_OINTMENT | Freq: Two times a day (BID) | CUTANEOUS | 0 refills | Status: DC

## 2019-05-30 NOTE — Telephone Encounter (Signed)
Patient called in to make an appt but was having a hard time getting off work to come in since she works an hour away.  I offered her several appts, including a virtual appt with Dr. Brett Canales tomorrow but she is going to wait and see and maybe go to urgent care when she gets off work.

## 2019-05-30 NOTE — ED Triage Notes (Signed)
Pt has abrasion to left lower leg that happened last week and is not healing, redness and selling noted

## 2019-05-30 NOTE — ED Provider Notes (Signed)
[facility]   [id number] 05/30/19 Arrival Time: 1706  CC: Wound  SUBJECTIVE:  Haley Washington is a 34 y.o. female who presents with a wound to LLE x 1.5 weeks.  Symptoms began after scratching leg.  Localizes the wound LLE.  Describes it as painful and red.  Has tried OTC neosporin without relief.  Symptoms are made worse to the touch.  Reports similar symptoms in the past.   Denies fever, chills, nausea, vomiting, swelling, SOB, chest pain, calf pain, calf swelling, abdominal pain, changes in bowel or bladder function.    ROS: As per HPI.  All other pertinent ROS negative.     Past Medical History:  Diagnosis Date  . Acne    has been on aldactone in the past.  Also tried Accutane without improvement in her symptoms   . Allergy   . History of chicken pox   . Hx: UTI (urinary tract infection)   . Hyperlipidemia   . Low back pain 2017   improved with physical therapy  . Morbid obesity with BMI of 40.0-44.9, adult [facility])    Past Surgical History:  Procedure Laterality Date  . ADENOIDECTOMY    . DILATION AND EVACUATION N/A 10/27/2016   Procedure: DILATATION AND EVACUATION;  Surgeon: Waynard Reeds, MD;  Location: [facility];  Service: Gynecology;  Laterality: N/A;   Allergies  Allergen Reactions  . Lactose Intolerance (Gi) Other (See Comments)    GI upset   No current facility-administered medications on file prior to encounter.   Current Outpatient Medications on File Prior to Encounter  Medication Sig Dispense Refill  . mometasone (NASONEX) 50 MCG/ACT nasal spray Use one to two sprays in each nostril once daily as directed. (Patient taking differently: Place 2 sprays into the nose every other day. Use one to two sprays in each nostril once daily as directed.) 51 g 0   Social History   Socioeconomic History  . Marital status: Married    Spouse name: Vicente Serene  . Number of children: Not on file  . Years of education: Not on file  . Highest education level: Not  on file  Occupational History  . Not on file  Tobacco Use  . Smoking status: Never Smoker  . Smokeless tobacco: Never Used  Substance and Sexual Activity  . Alcohol use: Yes    Comment: rarely  . Drug use: No  . Sexual activity: Yes    Partners: Male    Birth control/protection: None    Comment: natural family planning (NFP)  Other Topics Concern  . 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 Difficulty of Paying Living Expenses:   Food Insecurity:   . Worried About Programme researcher, broadcasting/film/video in the Last Year:   . Barista in the Last Year:   Transportation Needs:   . Freight forwarder (Medical):   Marland Kitchen Lack of Transportation (Non-Medical):   Physical Activity:   . Days of Exercise per Week:   . Minutes of Exercise per Session:   Stress:   . Feeling of Stress :   Social Connections:   . Frequency of Communication with Friends and Family:   . Frequency of Social Gatherings with Friends and Family:   .  Attends Religious Services:   . Active Member of Clubs or Organizations:   . Attends Archivist Meetings:   Marland Kitchen Marital Status:   Intimate Partner Violence:   . Fear of Current or Ex-Partner:   . Emotionally Abused:   Marland Kitchen Physically Abused:   . Sexually Abused:    Family History  Problem Relation Age of Onset  . Hyperlipidemia Mother   . Hypertension Mother   . Obesity Mother   . Alcohol abuse Father        recurring  . Obesity Brother   . Mental illness Maternal Grandmother        anxiety  . Arthritis Maternal Grandfather   . Cancer Maternal Grandfather        colon, diagnosed in 78's  . Diabetes Maternal Grandfather   . Heart disease Paternal Grandfather   . Cancer Paternal Grandfather        lymphoma, ? lung CA- smoker    OBJECTIVE: Vitals:     05/30/19 1720  BP: 135/84  Pulse: 80  Resp: 18  Temp: (!) 97.5 F (36.4 C)  SpO2: 99%    General appearance: alert; no distress Head: NCAT Lungs: Normal respiratory effort Skin: warm and dry; abrasion to LT anterior shin, erythematous, scab formation with surrounding erythema, one open wound apx 1 cm in diameter, some purulent drainage, TTP, blanches with pressure, no bleeding Psychological: alert and cooperative; normal mood and affect  ASSESSMENT & PLAN:  1. Cellulitis of leg, left     Meds ordered this encounter  Medications  . doxycycline (VIBRAMYCIN) 100 MG capsule    Sig: Take 1 capsule (100 mg total) by mouth 2 (two) times daily.    Dispense:  20 capsule    Refill:  0    Order Specific Question:   Supervising Provider    Answer:   Raylene Everts [[id number]]  . mupirocin ointment (BACTROBAN) 2 %    Sig: Place 1 application into the nose 2 (two) times daily.    Dispense:  22 g    Refill:  0    Order Specific Question:   Supervising Provider    Answer:   Raylene Everts [[id number]]   Wash with warm water and mild soap Keep covered Prescribed doxycycline take as directed and to completion Bactroban ointment prescribed.  Use as directed Alternate ibuprofen and tylenol as needed for pain and fever Follow up with PCP for further evaluation and management Return or go to the ED if you have any new or worsening symptoms such as increased pain, redness, swelling, discharge, high fever, night sweats, abdominal pain, etc...   Reviewed expectations re: course of current medical issues. Questions answered. Outlined signs and symptoms indicating need for more acute intervention. Patient verbalized understanding. After Visit Summary given.   Lestine Box, PA-C 05/30/19 1741

## 2019-05-30 NOTE — Discharge Instructions (Addendum)
Wash with warm water and mild soap Keep covered Prescribed doxycycline take as directed and to completion Bactroban ointment prescribed.  Use as directed Alternate ibuprofen and tylenol as needed for pain and fever Follow up with PCP for further evaluation and management Return or go to the ED if you have any new or worsening symptoms such as increased pain, redness, swelling, discharge, high fever, night sweats, abdominal pain, etc..Marland Kitchen

## 2019-06-01 NOTE — Telephone Encounter (Signed)
[name] M, DO     Needs appt for follow up on the wound in the next 7 days.  Then we can refer from there.

## 2019-06-20 ENCOUNTER — Encounter (HOSPITAL_BASED_OUTPATIENT_CLINIC_OR_DEPARTMENT_OTHER): Payer: PRIVATE HEALTH INSURANCE | Admitting: Internal Medicine

## 2019-07-04 LAB — OB RESULTS CONSOLE ABO/RH: RH Type: POSITIVE

## 2019-07-04 LAB — OB RESULTS CONSOLE RPR: RPR: NONREACTIVE

## 2019-07-04 LAB — OB RESULTS CONSOLE GC/CHLAMYDIA
Chlamydia: NEGATIVE
Gonorrhea: NEGATIVE

## 2019-07-04 LAB — OB RESULTS CONSOLE RUBELLA ANTIBODY, IGM: Rubella: IMMUNE

## 2019-07-04 LAB — OB RESULTS CONSOLE HIV ANTIBODY (ROUTINE TESTING): HIV: NONREACTIVE

## 2019-07-04 LAB — OB RESULTS CONSOLE HEPATITIS B SURFACE ANTIGEN: Hepatitis B Surface Ag: NEGATIVE

## 2019-07-04 LAB — OB RESULTS CONSOLE ANTIBODY SCREEN: Antibody Screen: NEGATIVE

## 2019-07-05 ENCOUNTER — Ambulatory Visit (INDEPENDENT_AMBULATORY_CARE_PROVIDER_SITE_OTHER): Payer: PRIVATE HEALTH INSURANCE | Admitting: Family Medicine

## 2019-07-05 DIAGNOSIS — R1111 Vomiting without nausea: Secondary | ICD-10-CM | POA: Diagnosis not present

## 2019-07-05 MED ORDER — PROMETHAZINE HCL 25 MG/ML IJ SOLN
25.0000 mg | Freq: Four times a day (QID) | INTRAMUSCULAR | Status: DC | PRN
  Administered 2019-07-05: 25 mg via INTRAMUSCULAR

## 2019-07-05 MED ORDER — PROMETHAZINE HCL 25 MG PO TABS: ORAL_TABLET | ORAL | 0 refills | Status: DC

## 2019-07-05 NOTE — Progress Notes (Signed)
Established Patient Office Visit  Subjective:  Patient ID: Haley Washington, female    DOB: 1986/01/04  Age: 34 y.o. MRN: [id number]  CC: No chief complaint on file.   [name] presents for nausea and vomiting All of the sickness kicked in earlier today last night she felt fine she is now having nausea and vomiting denies severe abdominal pain denies hematuria denies vomiting blood or rectal bleeding denies high fever did have some chills no sore throat or runny nose daughter is sick with respiratory illness Past Medical History:  Diagnosis Date  . Acne    has been on aldactone in the past.  Also tried Accutane without improvement in her symptoms   . Allergy   . History of chicken pox   . Hx: UTI (urinary tract infection)   . Hyperlipidemia   . Low back pain 2017   improved with physical therapy  . Morbid obesity with BMI of 40.0-44.9, adult [facility])     Past Surgical History:  Procedure Laterality Date  . ADENOIDECTOMY    . DILATION AND EVACUATION N/A 10/27/2016   Procedure: DILATATION AND EVACUATION;  Surgeon: Vanessa Kick, MD;  Location: [place];  Service: Gynecology;  Laterality: N/A;    Family History  Problem Relation Age of Onset  . Hyperlipidemia Mother   . Hypertension Mother   . Obesity Mother   . Alcohol abuse Father        recurring  . Obesity Brother   . Mental illness Maternal Grandmother        anxiety  . Arthritis Maternal Grandfather   . Cancer Maternal Grandfather        colon, diagnosed in 56's  . Diabetes Maternal Grandfather   . Heart disease Paternal Grandfather   . Cancer Paternal Grandfather        lymphoma, ? lung CA- smoker    Social History   Socioeconomic History  . Marital status: Married    Spouse name: Valarie Merino  . Number of children: Not on file  . Years of education: Not on file  . Highest education level: Not on file  Occupational History  . Not on file  Tobacco Use  . Smoking status: Never Smoker  .  Smokeless tobacco: Never Used  Vaping Use  . Vaping Use: Never used  Substance and Sexual Activity  . Alcohol use: Yes    Comment: rarely  . Drug use: No  . Sexual activity: Yes    Partners: Male    Birth control/protection: None    Comment: natural family planning (NFP)  Other Topics Concern  . 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 Difficulty of Paying Living Expenses:   Food Insecurity:   . Worried About Charity fundraiser in the Last Year:   . Arboriculturist in the Last Year:   Transportation Needs:   . Film/video editor (Medical):   Marland Kitchen Lack of Transportation (Non-Medical):   Physical Activity:   . Days of Exercise per Week:   . Minutes of Exercise per Session:   Stress:   . Feeling of Stress :   Social Connections:   . Frequency of Communication with Friends and Family:   . Frequency  of Social Gatherings with Friends and Family:   . Attends Religious Services:   . Active Member of Clubs or Organizations:   . Attends Banker Meetings:   Marland Kitchen Marital Status:   Intimate Partner Violence:   . Fear of Current or Ex-Partner:   . Emotionally Abused:   Marland Kitchen Physically Abused:   . Sexually Abused:     Outpatient Medications Prior to Visit  Medication Sig Dispense Refill  . doxycycline (VIBRAMYCIN) 100 MG capsule Take 1 capsule (100 mg total) by mouth 2 (two) times daily. 20 capsule 0  . mometasone (NASONEX) 50 MCG/ACT nasal spray Use one to two sprays in each nostril once daily as directed. (Patient taking differently: Place 2 sprays into the nose every other day. Use one to two sprays in each nostril once daily as directed.) 51 g 0  . mupirocin ointment (BACTROBAN) 2 % Place 1 application into the nose 2 (two) times daily. 22 g 0   No  facility-administered medications prior to visit.    Allergies  Allergen Reactions  . Lactose Intolerance (Gi) Other (See Comments)    GI upset    ROS Review of Systems   Significant nausea vomiting some intermittent abdominal cramps Objective:    Physical Exam  There were no vitals taken for this visit. Wt Readings from Last 3 Encounters:  03/01/18 262 lb (118.8 kg)  01/25/18 264 lb 0.6 oz (119.8 kg)  12/22/17 264 lb (119.7 kg)     Health Maintenance Due  Topic Date Due  . Hepatitis C Screening  Never done  . PAP SMEAR-Modifier  07/30/2019    There are no preventive care reminders to display for this patient.  Lab Results  Component Value Date   TSH 2.300 01/29/2018   Lab Results  Component Value Date   WBC 12.0 (H) 09/12/2017   HGB 8.6 (L) 09/12/2017   HCT 26.5 (L) 09/12/2017   MCV 88.0 09/12/2017   PLT 136 (L) 09/12/2017   Lab Results  Component Value Date   NA 136 10/26/2016   K 3.6 10/26/2016   CO2 22 10/26/2016   GLUCOSE 84 01/29/2018   BUN 13 10/26/2016   CREATININE 0.79 10/26/2016   BILITOT 0.5 10/26/2016   ALKPHOS 68 10/26/2016   AST 35 10/26/2016   ALT 27 10/26/2016   PROT 7.5 10/26/2016   ALBUMIN 4.1 10/26/2016   CALCIUM 9.0 10/26/2016   ANIONGAP 8 10/26/2016   GFR 92.84 08/12/2016   Lab Results  Component Value Date   CHOL 186 01/29/2018   Lab Results  Component Value Date   HDL 52 01/29/2018   Lab Results  Component Value Date   LDLCALC 116 (H) 01/29/2018   Lab Results  Component Value Date   TRIG 92 01/29/2018   Lab Results  Component Value Date   CHOLHDL 3.6 01/29/2018   No results found for: HGBA1C    Assessment & Plan:  Viral gastroenteritis supportive measures discussed Problem List Items Addressed This Visit    None    Visit Diagnoses    Vomiting without nausea, intractability of vomiting not specified, unspecified vomiting type    -  Primary   Relevant Medications   promethazine (PHENERGAN) injection 25  mg     Warning signs were discussed in detail what to watch for clear liquids bland diet May use Phenergan as needed already has Zofran at home Meds ordered this encounter  Medications  . promethazine (PHENERGAN) injection 25 mg    Follow-up:  No follow-ups on file.    Lilyan Punt, MD

## 2019-08-15 ENCOUNTER — Ambulatory Visit (INDEPENDENT_AMBULATORY_CARE_PROVIDER_SITE_OTHER): Payer: PRIVATE HEALTH INSURANCE

## 2019-08-15 ENCOUNTER — Other Ambulatory Visit: Payer: Self-pay

## 2019-08-15 ENCOUNTER — Ambulatory Visit
Admission: RE | Admit: 2019-08-15 | Discharge: 2019-08-15 | Disposition: A | Discharge status: Home / Self Care | Payer: PRIVATE HEALTH INSURANCE | Source: Ambulatory Visit

## 2019-08-15 VITALS — BP 131/77 | HR 85 | Temp 97.9°F | Resp 19 | Ht 64.0 in | Wt 260.1 lb

## 2019-08-15 DIAGNOSIS — M25572 Pain in left ankle and joints of left foot: Secondary | ICD-10-CM

## 2019-08-15 DIAGNOSIS — S82839A Other fracture of upper and lower end of unspecified fibula, initial encounter for closed fracture: Secondary | ICD-10-CM | POA: Diagnosis not present

## 2019-08-15 NOTE — ED Triage Notes (Signed)
Pt tripped and feel today on concrete.  Pt has pain and swelling to LT ankle.

## 2019-08-15 NOTE — Discharge Instructions (Addendum)
Call [facility] tomorrow You have a fracture of the distal fibula Rest, ice, ibuprofen for pain as needed Use the crutches and wear the brace. You can take the brace off as needed

## 2019-08-16 ENCOUNTER — Other Ambulatory Visit (HOSPITAL_COMMUNITY): Payer: Self-pay | Admitting: Sports Medicine

## 2019-08-16 ENCOUNTER — Ambulatory Visit (HOSPITAL_COMMUNITY)
Admission: RE | Admit: 2019-08-16 | Discharge: 2019-08-16 | Disposition: A | Discharge status: Home / Self Care | Payer: PRIVATE HEALTH INSURANCE | Source: Ambulatory Visit | Attending: Cardiology | Admitting: Cardiology

## 2019-08-16 ENCOUNTER — Other Ambulatory Visit: Payer: Self-pay

## 2019-08-16 DIAGNOSIS — M79662 Pain in left lower leg: Secondary | ICD-10-CM | POA: Diagnosis present

## 2019-08-16 DIAGNOSIS — M7989 Other specified soft tissue disorders: Secondary | ICD-10-CM | POA: Insufficient documentation

## 2019-08-16 NOTE — ED Provider Notes (Signed)
[facility]    CSN: [id number] Arrival date & time: 08/15/19  1700      History   Chief Complaint Chief Complaint  Patient presents with  . Foot Injury    HPI Haley Washington is a 34 y.o. female.   Pt is a 34 year old female that presents with pain and swelling to the left ankle. This occurred this am. She tripped and fell landing on concrete. Most pain and swelling to left lateral ankle. She has been able to walk on the foot. Sensation intact.  No numbness, tingling.   ROS per HPI      Past Medical History:  Diagnosis Date  . Acne    has been on aldactone in the past.  Also tried Accutane without improvement in her symptoms   . Allergy   . History of chicken pox   . Hx: UTI (urinary tract infection)   . Hyperlipidemia   . Low back pain 2017   improved with physical therapy  . Morbid obesity with BMI of 40.0-44.9, adult [facility])     Patient Active Problem List   Diagnosis Date Noted  . Pedal edema 01/25/2018  . Post-dates pregnancy 09/10/2017  . Right knee injury, initial encounter 12/30/2016  . Right wrist injury, initial encounter 12/30/2016  . Missed abortion 10/27/2016  . Morbid obesity with BMI of 40.0-44.9, adult [facility])     Past Surgical History:  Procedure Laterality Date  . ADENOIDECTOMY    . DILATION AND EVACUATION N/A 10/27/2016   Procedure: DILATATION AND EVACUATION;  Surgeon: Waynard Reeds, MD;  Location: [facility] BIRTHING SUITES;  Service: Gynecology;  Laterality: N/A;    OB History    Gravida  2   Para  1   Term  1   Preterm      AB  1   Living  1     SAB  1   TAB      Ectopic      Multiple  0   Live Births  1            Home Medications    Prior to Admission medications   Medication Sig Start Date End Date Taking? Authorizing Provider  cetirizine (ZYRTEC) 10 MG tablet Take 10 mg by mouth daily.   Yes [provider]  Prenatal Vit-Fe Fumarate-FA (PRENATAL MULTIVITAMIN) TABS tablet Take 1 tablet by mouth  daily at 12 noon.   Yes [provider]  doxycycline (VIBRAMYCIN) 100 MG capsule Take 1 capsule (100 mg total) by mouth 2 (two) times daily. 05/30/19   Wurst, [place], PA-C  mometasone (NASONEX) 50 MCG/ACT nasal spray Use one to two sprays in each nostril once daily as directed. Patient taking differently: Place 2 sprays into the nose every other day. Use one to two sprays in each nostril once daily as directed. 06/23/17   Kozlow, Alvira Philips, MD  mupirocin ointment (BACTROBAN) 2 % Place 1 application into the nose 2 (two) times daily. 05/30/19   Wurst, [place], PA-C  promethazine (PHENERGAN) 25 MG tablet One half a tablet or a whole tablet every 8 hours as needed nausea vomiting caution drowsiness 07/05/19   Babs Sciara, MD    Family History Family History  Problem Relation Age of Onset  . Hyperlipidemia Mother   . Hypertension Mother   . Obesity Mother   . Alcohol abuse Father        recurring  . Obesity Brother   . Mental illness Maternal  Grandmother        anxiety  . Arthritis Maternal Grandfather   . Cancer Maternal Grandfather        colon, diagnosed in 23's  . Diabetes Maternal Grandfather   . Heart disease Paternal Grandfather   . Cancer Paternal Grandfather        lymphoma, ? lung CA- smoker    Social History Social History   Tobacco Use  . Smoking status: Never Smoker  . Smokeless tobacco: Never Used  Vaping Use  . Vaping Use: Never used  Substance Use Topics  . Alcohol use: Yes    Comment: rarely  . Drug use: No     Allergies   Lactose intolerance (gi)   Review of Systems Review of Systems   Physical Exam Triage Vital Signs ED Triage Vitals  Enc Vitals Group     BP 08/15/19 1726 (!) 131/77     Pulse Rate 08/15/19 1726 85     Resp 08/15/19 1726 19     Temp 08/15/19 1726 97.9 F (36.6 C)     Temp Source 08/15/19 1726 Oral     SpO2 08/15/19 1726 98 %     Weight 08/15/19 1724 (!) 260 lb 2.3 oz (118 kg)     Height 08/15/19 1724 5\' 4"  (1.626 m)      Head Circumference --      Peak Flow --      Pain Score 08/15/19 1723 7     Pain Loc --      Pain Edu? --      Excl. in GC? --    No data found.  Updated Vital Signs BP (!) 131/77 (BP Location: Right Arm)   Pulse 85   Temp 97.9 F (36.6 C) (Oral)   Resp 19   Ht 5\' 4"  (1.626 m)   Wt (!) 260 lb 2.3 oz (118 kg)   SpO2 98%   BMI 44.65 kg/m   Visual Acuity Right Eye Distance:   Left Eye Distance:   Bilateral Distance:    Right Eye Near:   Left Eye Near:    Bilateral Near:     Physical Exam Vitals and nursing note reviewed.  Constitutional:      General: She is not in acute distress.    Appearance: Normal appearance. She is not ill-appearing, toxic-appearing or diaphoretic.  HENT:     Head: Normocephalic.     Nose: Nose normal.  Eyes:     Conjunctiva/sclera: Conjunctivae normal.  Cardiovascular:     Pulses:          Dorsalis pedis pulses are 2+ on the left side.  Pulmonary:     Effort: Pulmonary effort is normal.  Musculoskeletal:     Cervical back: Normal range of motion.     Left foot: Normal range of motion.       Feet:  Feet:     Comments: Tenderness and swelling to the left lateral malleolus no bruising or deformity Skin:    General: Skin is warm and dry.     Findings: No rash.  Neurological:     Mental Status: She is alert.  Psychiatric:        Mood and Affect: Mood normal.      UC Treatments / Results  Labs (all labs ordered are listed, but only abnormal results are displayed) Labs Reviewed - No data to display  EKG   Radiology DG Ankle Complete Left  Result Date: 08/15/2019 CLINICAL DATA:  Fall with  left ankle injury. Pain and bruising laterally. EXAM: LEFT ANKLE COMPLETE - 3+ VIEW COMPARISON:  None. FINDINGS: Small acute avulsion fracture from the distal fibula. No additional fracture. Normal mortise without mortise widening. No ankle joint effusion. There is a plantar calcaneal spur and Achilles tendon enthesophyte. Generalized soft  tissue edema. IMPRESSION: Small acute avulsion fracture from the distal fibula. Associated soft tissue edema. Electronically Signed   By: Narda Rutherford M.D.   On: 08/15/2019 18:11    Procedures Procedures (including critical care time)  Medications Ordered in UC Medications - No data to display  Initial Impression / Assessment and Plan / UC Course  I have reviewed the triage vital signs and the nursing notes.  Pertinent labs & imaging results that were available during my care of the patient were reviewed by me and considered in my medical decision making (see chart for details).     Avulsion fracture of distal fibula Nonweightbearing with crutches and ASO Sending to Ortho for follow-up tomorrow and possible cam walker boot. Rest, ice, elevate Ibuprofen for pain as needed  Final Clinical Impressions(s) / UC Diagnoses   Final diagnoses:  Avulsion fracture of distal fibula     Discharge Instructions     Call [facility] tomorrow You have a fracture of the distal fibula Rest, ice, ibuprofen for pain as needed Use the crutches and wear the brace. You can take the brace off as needed     ED Prescriptions    None     PDMP not reviewed this encounter.   Dahlia Byes A, NP 08/16/19 [phone]

## 2019-08-29 ENCOUNTER — Ambulatory Visit: Payer: PRIVATE HEALTH INSURANCE | Admitting: Orthopaedic Surgery

## 2020-01-03 LAB — OB RESULTS CONSOLE RUBELLA ANTIBODY, IGM: Rubella: IMMUNE

## 2020-01-03 LAB — OB RESULTS CONSOLE HEPATITIS B SURFACE ANTIGEN: Hepatitis B Surface Ag: NEGATIVE

## 2020-01-03 LAB — OB RESULTS CONSOLE HIV ANTIBODY (ROUTINE TESTING): HIV: NONREACTIVE

## 2020-01-20 NOTE — L&D Delivery Note (Signed)
Operative Delivery Note At 4:01 PM a viable and healthy female was delivered via .  Presentation: vertex; Position: Left,, Occiput,, Anterior; Station: +5.  Delivery of the head:   , McRoberts Second maneuver: , Suprapubic Pressure Third maneuver: ,  posterior arm  Pt pushed for approximately 15 minutes. Following delivery of the head the anterior shoulder did not immediately deliver. A shoulder dystocia was called and the maternal legs were placed in McRoberts and suprapubic pressure was applied. With the arrival of assistants the patient was replaced in McRoberts and the anterior shoulder and posterior arm both delivered followed by the body and the infant was placed on the maternal abdomen. After a 1 minute delay the umbilical cord was clamped and cut. The placenta required manual extraction to deliver due to persistent attachment to the uterine fundus. No lacerations required repair. All sponge, instrument, and needle counts were correct. Mom and baby are doing well following delivery.  Verbal consent: obtained from patient.  APGAR: 7, 9; weight pending .   Placenta status: manually extracted   Cord: 3V   Anesthesia:  Epidural  Episiotomy:  None Lacerations:  None Suture Repair:  NA Est. Blood Loss (mL):  575 cc  Mom to postpartum.  Baby to Couplet care / Skin to Skin.  Haley Washington 07/25/2020, 4:25 PM

## 2020-01-22 ENCOUNTER — Ambulatory Visit (INDEPENDENT_AMBULATORY_CARE_PROVIDER_SITE_OTHER): Payer: PRIVATE HEALTH INSURANCE | Admitting: Family Medicine

## 2020-01-22 ENCOUNTER — Encounter: Payer: Self-pay | Admitting: Family Medicine

## 2020-01-22 DIAGNOSIS — B349 Viral infection, unspecified: Secondary | ICD-10-CM

## 2020-01-22 DIAGNOSIS — R059 Cough, unspecified: Secondary | ICD-10-CM | POA: Diagnosis not present

## 2020-01-22 LAB — OB RESULTS CONSOLE GC/CHLAMYDIA
Chlamydia: NEGATIVE
Gonorrhea: NEGATIVE

## 2020-01-22 NOTE — Progress Notes (Signed)
   Subjective:    Patient ID: NESREEN ALBANO, female    DOB: Feb 16, 1985, 35 y.o.   MRN: [id number]  HPI  pt is [redacted] weeks pregnant  Patient presents today with respiratory illness Number of days present - 7 days  Symptoms include - cough, congestion, sinus drainage, pink eye. Tried mucinex and tylenol  Presence of worrisome signs (severe shortness of breath, lethargy, etc.) - none  Recent/current visit to urgent care or ER- none  Recent direct exposure to Covid- none  Any current Covid testing- rapid test on the 24th which was negative.   Symptoms going on for about the past 9 days with head congestion drainage coughing some crusting in the right eye but not severe Review of Systems Please see above    Objective:   Physical Exam  Patient approximately 12 to [redacted] weeks pregnant Lungs clear respiratory rate normal eardrums normal throat is normal minimal redness right eye      Assessment & Plan:  Viral conjunctivitis right eye viral syndrome Covid test taken Hold off on antibiotics Follow-up if progressive troubles or worse Recheck if any problems

## 2020-01-24 LAB — SARS-COV-2, NAA 2 DAY TAT

## 2020-01-24 LAB — NOVEL CORONAVIRUS, NAA: SARS-CoV-2, NAA: NOT DETECTED

## 2020-02-02 NOTE — Telephone Encounter (Signed)
So there are very limited options in regards to medications with pregnancy  Typically Claritin, Mucinex, lozenge or's, nasal spray  As for the prolonged illness it is possible still that this could be a virus that is causing this prolonged aspect it is also possible that there could be some underlying sinus  Is Haley Washington having any sinus tenderness pain discomfort?  Is the phlegm discolored (please note FYI- discolored phlegm does not necessarily mean a person needs to be on an antibiotic but if an illness is been going on for several weeks sometimes that can be a sign of secondary infection)  So in some situations with prolonged illness as well as sinus discomfort as well as discolored phlegm antibiotic would be reasonable  Nurses please find out this additional information

## 2020-02-03 ENCOUNTER — Other Ambulatory Visit: Payer: PRIVATE HEALTH INSURANCE

## 2020-02-03 DIAGNOSIS — Z20822 Contact with and (suspected) exposure to covid-19: Secondary | ICD-10-CM

## 2020-02-06 LAB — NOVEL CORONAVIRUS, NAA: SARS-CoV-2, NAA: NOT DETECTED

## 2020-02-12 ENCOUNTER — Other Ambulatory Visit: Payer: Self-pay | Admitting: Obstetrics

## 2020-02-12 DIAGNOSIS — O99212 Obesity complicating pregnancy, second trimester: Secondary | ICD-10-CM

## 2020-02-12 DIAGNOSIS — O09522 Supervision of elderly multigravida, second trimester: Secondary | ICD-10-CM

## 2020-02-29 ENCOUNTER — Encounter: Payer: Self-pay | Admitting: *Deleted

## 2020-03-04 ENCOUNTER — Ambulatory Visit: Payer: PRIVATE HEALTH INSURANCE | Attending: Obstetrics

## 2020-03-04 ENCOUNTER — Other Ambulatory Visit: Payer: Self-pay | Admitting: *Deleted

## 2020-03-04 ENCOUNTER — Other Ambulatory Visit: Payer: Self-pay

## 2020-03-04 ENCOUNTER — Ambulatory Visit: Payer: PRIVATE HEALTH INSURANCE | Admitting: *Deleted

## 2020-03-04 ENCOUNTER — Encounter: Payer: Self-pay | Admitting: *Deleted

## 2020-03-04 VITALS — BP 124/74 | HR 99

## 2020-03-04 DIAGNOSIS — O09512 Supervision of elderly primigravida, second trimester: Secondary | ICD-10-CM | POA: Insufficient documentation

## 2020-03-04 DIAGNOSIS — R638 Other symptoms and signs concerning food and fluid intake: Secondary | ICD-10-CM

## 2020-03-04 DIAGNOSIS — O99212 Obesity complicating pregnancy, second trimester: Secondary | ICD-10-CM | POA: Diagnosis present

## 2020-03-04 DIAGNOSIS — O09522 Supervision of elderly multigravida, second trimester: Secondary | ICD-10-CM | POA: Diagnosis not present

## 2020-04-01 ENCOUNTER — Ambulatory Visit: Payer: PRIVATE HEALTH INSURANCE | Attending: Obstetrics and Gynecology

## 2020-04-01 ENCOUNTER — Ambulatory Visit: Payer: PRIVATE HEALTH INSURANCE | Admitting: *Deleted

## 2020-04-01 ENCOUNTER — Encounter: Payer: Self-pay | Admitting: *Deleted

## 2020-04-01 ENCOUNTER — Other Ambulatory Visit: Payer: Self-pay | Admitting: *Deleted

## 2020-04-01 ENCOUNTER — Other Ambulatory Visit: Payer: Self-pay

## 2020-04-01 DIAGNOSIS — Z6841 Body Mass Index (BMI) 40.0 and over, adult: Secondary | ICD-10-CM

## 2020-04-01 DIAGNOSIS — R638 Other symptoms and signs concerning food and fluid intake: Secondary | ICD-10-CM | POA: Insufficient documentation

## 2020-04-01 DIAGNOSIS — O99212 Obesity complicating pregnancy, second trimester: Secondary | ICD-10-CM | POA: Diagnosis not present

## 2020-04-01 DIAGNOSIS — O09522 Supervision of elderly multigravida, second trimester: Secondary | ICD-10-CM

## 2020-04-01 DIAGNOSIS — Z363 Encounter for antenatal screening for malformations: Secondary | ICD-10-CM | POA: Diagnosis not present

## 2020-04-01 DIAGNOSIS — Z3A23 23 weeks gestation of pregnancy: Secondary | ICD-10-CM

## 2020-04-29 ENCOUNTER — Ambulatory Visit: Payer: PRIVATE HEALTH INSURANCE

## 2020-04-29 ENCOUNTER — Ambulatory Visit
Admission: EM | Admit: 2020-04-29 | Discharge: 2020-04-29 | Disposition: A | Discharge status: Home / Self Care | Payer: PRIVATE HEALTH INSURANCE | Attending: Family Medicine | Admitting: Family Medicine

## 2020-04-29 ENCOUNTER — Other Ambulatory Visit: Payer: Self-pay

## 2020-04-29 ENCOUNTER — Encounter: Payer: Self-pay | Admitting: Emergency Medicine

## 2020-04-29 DIAGNOSIS — J209 Acute bronchitis, unspecified: Secondary | ICD-10-CM

## 2020-04-29 DIAGNOSIS — R053 Chronic cough: Secondary | ICD-10-CM

## 2020-04-29 DIAGNOSIS — Z3A27 27 weeks gestation of pregnancy: Secondary | ICD-10-CM

## 2020-04-29 MED ORDER — PROMETHAZINE-DM 6.25-15 MG/5ML PO SYRP
5.0000 mL | ORAL_SOLUTION | Freq: Four times a day (QID) | ORAL | 0 refills | Status: DC | PRN
Start: 2020-04-29 — End: 2020-07-27

## 2020-04-29 MED ORDER — AZITHROMYCIN 250 MG PO TABS
ORAL_TABLET | ORAL | 0 refills | Status: DC
Start: 2020-04-29 — End: 2020-07-27

## 2020-04-29 NOTE — ED Triage Notes (Addendum)
Pt has barking cough that has continued to get worse  since last Saturday, pt is [redacted] weeks pregnant. Missed work today.

## 2020-04-29 NOTE — ED Provider Notes (Signed)
[facility]    CSN: [id number] Arrival date & time: 04/29/20  1545      History   Chief Complaint No chief complaint on file.   HPI Haley Washington is a 34 y.o. female.   HPI  Patient is 46 gestation. Mucinex for at least one week for coughing  Fever over the week ago which subsequently resolved without treatment. Patient is concerned his cough has progressively worsened she has been taking recommended over-the-counter medications however has not achieved any relief.  She is also endorses some nasal drainage. She is coughing to the point that she is having urinary incontinence and is generally feels unwell. She is not having any shortness of breath or wheezing.  No documented history of asthma or reactive airway disease. Past Medical History:  Diagnosis Date  . Acne    has been on aldactone in the past.  Also tried Accutane without improvement in her symptoms   . Allergy   . History of chicken pox   . Hx: UTI (urinary tract infection)   . Hyperlipidemia   . Low back pain 2017   improved with physical therapy  . Morbid obesity with BMI of 40.0-44.9, adult [facility])     Patient Active Problem List   Diagnosis Date Noted  . Pedal edema 01/25/2018  . Post-dates pregnancy 09/10/2017  . Right knee injury, initial encounter 12/30/2016  . Right wrist injury, initial encounter 12/30/2016  . Missed abortion 10/27/2016  . Morbid obesity with BMI of 40.0-44.9, adult [facility])     Past Surgical History:  Procedure Laterality Date  . ADENOIDECTOMY    . DILATION AND EVACUATION N/A 10/27/2016   Procedure: DILATATION AND EVACUATION;  Surgeon: Waynard Reeds, MD;  Location: [facility] BIRTHING SUITES;  Service: Gynecology;  Laterality: N/A;    OB History    Gravida  3   Para  1   Term  1   Preterm      AB  1   Living  1     SAB  1   IAB      Ectopic      Multiple  0   Live Births  1            Home Medications    Prior to Admission medications   Medication  Sig Start Date End Date Taking? Authorizing Provider  aspirin EC 81 MG tablet Take 81 mg by mouth daily. Swallow whole.    [provider]  cetirizine (ZYRTEC) 10 MG tablet Take 10 mg by mouth daily.    [provider]  mometasone (NASONEX) 50 MCG/ACT nasal spray Use one to two sprays in each nostril once daily as directed. Patient taking differently: Place 2 sprays into the nose every other day. Use one to two sprays in each nostril once daily as directed. 06/23/17   Kozlow, Alvira Philips, MD  Prenatal Vit-Fe Fumarate-FA (PRENATAL MULTIVITAMIN) TABS tablet Take 1 tablet by mouth daily at 12 noon.    [provider]  promethazine (PHENERGAN) 25 MG tablet One half a tablet or a whole tablet every 8 hours as needed nausea vomiting caution drowsiness 07/05/19   Babs Sciara, MD    Family History Family History  Problem Relation Age of Onset  . Hyperlipidemia Mother   . Hypertension Mother   . Obesity Mother   . Alcohol abuse Father        recurring  . Obesity Brother   . Mental illness Maternal Grandmother  anxiety  . Arthritis Maternal Grandfather   . Cancer Maternal Grandfather        colon, diagnosed in 63's  . Diabetes Maternal Grandfather   . Heart disease Paternal Grandfather   . Cancer Paternal Grandfather        lymphoma, ? lung CA- smoker    Social History Social History   Tobacco Use  . Smoking status: Never Smoker  . Smokeless tobacco: Never Used  Vaping Use  . Vaping Use: Never used  Substance Use Topics  . Alcohol use: Yes    Comment: rarely  . Drug use: No     Allergies   Lactose intolerance (gi)   Review of Systems Review of Systems Pertinent negatives listed in HPI   Physical Exam Triage Vital Signs ED Triage Vitals  Enc Vitals Group     BP 04/29/20 1822 (!) 141/84     Pulse Rate 04/29/20 1822 (!) 115     Resp 04/29/20 1822 19     Temp 04/29/20 1822 98.8 F (37.1 C)     Temp Source 04/29/20 1822 Oral     SpO2  04/29/20 1822 98 %     Weight --      Height --      Head Circumference --      Peak Flow --      Pain Score 04/29/20 1819 0     Pain Loc --      Pain Edu? --      Excl. in GC? --    No data found.  Updated Vital Signs BP (!) 141/84 (BP Location: Right Wrist)   Pulse (!) 115   Temp 98.8 F (37.1 C) (Oral)   Resp 19   LMP 10/16/2019   SpO2 98%   Visual Acuity Right Eye Distance:   Left Eye Distance:   Bilateral Distance:    Right Eye Near:   Left Eye Near:    Bilateral Near:     Physical Exam  General Appearance:    Alert, cooperative, no distress  HENT:   Normocephalic, ears normal, nares mucosal edema with congestion, rhinorrhea, oropharynx  Clear   Eyes:    PERRL, conjunctiva/corneas clear, EOM's intact       Lungs:     Clear to auscultation bilaterally, barking croupy cough, respirations unlabored  Heart:    Regular rate and rhythm  Neurologic:   Awake, alert, oriented x 3. No apparent focal neurological           defect.     UC Treatments / Results  Labs (all labs ordered are listed, but only abnormal results are displayed) Labs Reviewed - No data to display  EKG   Radiology No results found.  Procedures Procedures (including critical care time)  Medications Ordered in UC Medications - No data to display  Initial Impression / Assessment and Plan / UC Course  I have reviewed the triage vital signs and the nursing notes.  Pertinent labs & imaging results that were available during my care of the patient were reviewed by me and considered in my medical decision making (see chart for details).     Treating cough and acute bronchitis with azithromycin and Promethazine DM.  Patient advised to discontinue all other over-the-counter with the exception of continued her cetirizine and Nasonex.  Work note provided.  GYN/PCP follow-up as needed. Final Clinical Impressions(s) / UC Diagnoses   Final diagnoses:  Cough, persistent  Acute bronchitis,  unspecified organism  [redacted] weeks gestation  of pregnancy   Discharge Instructions   None    ED Prescriptions    Medication Sig Dispense Auth. Provider   azithromycin (ZITHROMAX Z-PAK) 250 MG tablet Azithromycin Take 2 tabs x 1 dose, then 1 tab every day for x 4 days 6 tablet Bing Neighbors, FNP   promethazine-dextromethorphan (PROMETHAZINE-DM) 6.25-15 MG/5ML syrup Take 5 mLs by mouth 4 (four) times daily as needed for cough. 140 mL Bing Neighbors, FNP     PDMP not reviewed this encounter.   Bing Neighbors, [place] 04/29/20 [phone]

## 2020-06-10 ENCOUNTER — Encounter: Payer: Self-pay | Admitting: Family Medicine

## 2020-07-08 LAB — OB RESULTS CONSOLE GBS: GBS: NEGATIVE

## 2020-07-17 ENCOUNTER — Telehealth (HOSPITAL_COMMUNITY): Payer: Self-pay | Admitting: *Deleted

## 2020-07-17 NOTE — Telephone Encounter (Signed)
Preadmission screen  

## 2020-07-19 ENCOUNTER — Encounter (HOSPITAL_COMMUNITY): Payer: Self-pay | Admitting: *Deleted

## 2020-07-23 ENCOUNTER — Other Ambulatory Visit (HOSPITAL_COMMUNITY): Payer: No Typology Code available for payment source

## 2020-07-24 ENCOUNTER — Encounter (HOSPITAL_COMMUNITY): Payer: Self-pay | Admitting: Obstetrics

## 2020-07-24 ENCOUNTER — Inpatient Hospital Stay (HOSPITAL_COMMUNITY): Payer: No Typology Code available for payment source

## 2020-07-24 ENCOUNTER — Inpatient Hospital Stay (HOSPITAL_COMMUNITY)
Admission: AD | Admit: 2020-07-24 | Discharge: 2020-07-27 | DRG: 796 | Disposition: A | Discharge status: Home / Self Care | Payer: No Typology Code available for payment source | Attending: Obstetrics and Gynecology | Admitting: Obstetrics and Gynecology

## 2020-07-24 DIAGNOSIS — D62 Acute posthemorrhagic anemia: Secondary | ICD-10-CM | POA: Diagnosis not present

## 2020-07-24 DIAGNOSIS — Z3A39 39 weeks gestation of pregnancy: Secondary | ICD-10-CM | POA: Diagnosis not present

## 2020-07-24 DIAGNOSIS — O43233 Placenta percreta, third trimester: Secondary | ICD-10-CM | POA: Diagnosis present

## 2020-07-24 DIAGNOSIS — U071 COVID-19: Secondary | ICD-10-CM | POA: Diagnosis present

## 2020-07-24 DIAGNOSIS — O9081 Anemia of the puerperium: Secondary | ICD-10-CM | POA: Diagnosis not present

## 2020-07-24 DIAGNOSIS — O9852 Other viral diseases complicating childbirth: Secondary | ICD-10-CM | POA: Diagnosis present

## 2020-07-24 DIAGNOSIS — O09523 Supervision of elderly multigravida, third trimester: Secondary | ICD-10-CM | POA: Diagnosis present

## 2020-07-24 DIAGNOSIS — O99214 Obesity complicating childbirth: Secondary | ICD-10-CM | POA: Diagnosis present

## 2020-07-24 DIAGNOSIS — O26893 Other specified pregnancy related conditions, third trimester: Secondary | ICD-10-CM | POA: Diagnosis present

## 2020-07-24 DIAGNOSIS — O134 Gestational [pregnancy-induced] hypertension without significant proteinuria, complicating childbirth: Principal | ICD-10-CM | POA: Diagnosis present

## 2020-07-24 LAB — COMPREHENSIVE METABOLIC PANEL
ALT: 74 U/L — ABNORMAL HIGH (ref 0–44)
AST: 40 U/L (ref 15–41)
Albumin: 2.5 g/dL — ABNORMAL LOW (ref 3.5–5.0)
Alkaline Phosphatase: 180 U/L — ABNORMAL HIGH (ref 38–126)
Anion gap: 10 (ref 5–15)
BUN: 16 mg/dL (ref 6–20)
CO2: 22 mmol/L (ref 22–32)
Calcium: 8.6 mg/dL — ABNORMAL LOW (ref 8.9–10.3)
Chloride: 105 mmol/L (ref 98–111)
Creatinine, Ser: 0.61 mg/dL (ref 0.44–1.00)
GFR, Estimated: >60 mL/min (ref >60)
Glucose, Bld: 102 mg/dL — ABNORMAL HIGH (ref 70–99)
Potassium: 3.3 mmol/L — ABNORMAL LOW (ref 3.5–5.1)
Sodium: 137 mmol/L (ref 135–145)
Total Bilirubin: 0.4 mg/dL (ref 0.3–1.2)
Total Protein: 5.9 g/dL — ABNORMAL LOW (ref 6.5–8.1)

## 2020-07-24 LAB — CBC
HCT: 33.4 % — ABNORMAL LOW (ref 36.0–46.0)
Hemoglobin: 10.9 g/dL — ABNORMAL LOW (ref 12.0–15.0)
MCH: 29.1 pg (ref 26.0–34.0)
MCHC: 32.6 g/dL (ref 30.0–36.0)
MCV: 89.1 fL (ref 80.0–100.0)
Platelets: 226 10*3/uL (ref 150–400)
RBC: 3.75 MIL/uL — ABNORMAL LOW (ref 3.87–5.11)
RDW: 13.9 % (ref 11.5–15.5)
WBC: 6.6 10*3/uL (ref 4.0–10.5)
nRBC: 0 % (ref 0.0–0.2)

## 2020-07-24 LAB — TYPE AND SCREEN
ABO/RH(D): O POS
Antibody Screen: NEGATIVE

## 2020-07-24 LAB — RESP PANEL BY RT-PCR (FLU A&B, COVID) ARPGX2
Influenza A by PCR: NEGATIVE
Influenza B by PCR: NEGATIVE
SARS Coronavirus 2 by RT PCR: POSITIVE — AB

## 2020-07-24 MED ORDER — OXYTOCIN-SODIUM CHLORIDE 30-0.9 UT/500ML-% IV SOLN
1.0000 m[IU]/min | INTRAVENOUS | Status: DC
Start: 2020-07-24 — End: 2020-07-25
  Administered 2020-07-24: 2 m[IU]/min via INTRAVENOUS

## 2020-07-24 MED ORDER — LACTATED RINGERS IV SOLN: 500.0000 mL | INTRAVENOUS | Status: DC | PRN

## 2020-07-24 MED ORDER — OXYTOCIN BOLUS FROM INFUSION
333.0000 mL | Freq: Once | INTRAVENOUS | Status: AC
  Administered 2020-07-25: 333 mL via INTRAVENOUS

## 2020-07-24 MED ORDER — EPHEDRINE 5 MG/ML INJ: 10.0000 mg | INTRAVENOUS | Status: DC | PRN

## 2020-07-24 MED ORDER — DIPHENHYDRAMINE HCL 50 MG/ML IJ SOLN: 12.5000 mg | INTRAMUSCULAR | Status: DC | PRN

## 2020-07-24 MED ORDER — FENTANYL-BUPIVACAINE-NACL 0.5-0.125-0.9 MG/250ML-% EP SOLN
12.0000 mL/h | EPIDURAL | Status: DC | PRN
  Administered 2020-07-25: 12 mL/h via EPIDURAL
  Filled 2020-07-24 (×2): qty 250

## 2020-07-24 MED ORDER — LACTATED RINGERS IV SOLN: 500.0000 mL | Freq: Once | INTRAVENOUS | Status: DC

## 2020-07-24 MED ORDER — OXYTOCIN-SODIUM CHLORIDE 30-0.9 UT/500ML-% IV SOLN
2.5000 [IU]/h | INTRAVENOUS | Status: DC
  Filled 2020-07-24 (×2): qty 500

## 2020-07-24 MED ORDER — OXYCODONE-ACETAMINOPHEN 5-325 MG PO TABS: 2.0000 | ORAL_TABLET | ORAL | Status: DC | PRN

## 2020-07-24 MED ORDER — ACETAMINOPHEN 325 MG PO TABS: 650.0000 mg | ORAL_TABLET | ORAL | Status: DC | PRN

## 2020-07-24 MED ORDER — LIDOCAINE HCL (PF) 1 % IJ SOLN: 30.0000 mL | INTRAMUSCULAR | Status: DC | PRN

## 2020-07-24 MED ORDER — TERBUTALINE SULFATE 1 MG/ML IJ SOLN: 0.2500 mg | Freq: Once | INTRAMUSCULAR | Status: DC | PRN

## 2020-07-24 MED ORDER — PHENYLEPHRINE 40 MCG/ML (10ML) SYRINGE FOR IV PUSH (FOR BLOOD PRESSURE SUPPORT): 80.0000 ug | PREFILLED_SYRINGE | INTRAVENOUS | Status: DC | PRN

## 2020-07-24 MED ORDER — OXYCODONE-ACETAMINOPHEN 5-325 MG PO TABS: 1.0000 | ORAL_TABLET | ORAL | Status: DC | PRN

## 2020-07-24 MED ORDER — FENTANYL CITRATE (PF) 100 MCG/2ML IJ SOLN
50.0000 ug | INTRAMUSCULAR | Status: DC | PRN
  Administered 2020-07-25 (×3): 100 ug via INTRAVENOUS
  Filled 2020-07-24 (×3): qty 2

## 2020-07-24 MED ORDER — SOD CITRATE-CITRIC ACID 500-334 MG/5ML PO SOLN: 30.0000 mL | ORAL | Status: DC | PRN

## 2020-07-24 MED ORDER — ONDANSETRON HCL 4 MG/2ML IJ SOLN
4.0000 mg | Freq: Four times a day (QID) | INTRAMUSCULAR | Status: DC | PRN
  Administered 2020-07-25 (×2): 4 mg via INTRAVENOUS
  Filled 2020-07-24 (×2): qty 2

## 2020-07-24 MED ORDER — LACTATED RINGERS IV SOLN: INTRAVENOUS | Status: DC

## 2020-07-24 NOTE — H&P (Signed)
35 y.o. G3P1011 @ [redacted]w[redacted]d presents for  induction of labor for advanced maternal age.  Otherwise has good fetal movement and no bleeding.  Upon arrival to L&D, she was noted to have new onset hypertension  Pregnancy complicated by:  AMA:  low risk NIPT Prepregnancy BMI 46: most recent growth [place] on 6/24 with EFW 7lb 4oz (60%) Covid:  breakthrough covid (s/p initial vaccine series + booster).  Symptoms began Friday 7/1.  tested positive on 7/2.  Cough and congestion, both improving.  No CP, SOB  Past Medical History:  Diagnosis Date   Acne    has been on aldactone in the past.  Also tried Accutane without improvement in her symptoms    Allergy    History of chicken pox    Hx: UTI (urinary tract infection)    Hyperlipidemia    Low back pain 2017   improved with physical therapy   Morbid obesity with BMI of 40.0-44.9, adult [facility])     Past Surgical History:  Procedure Laterality Date   ADENOIDECTOMY     DILATION AND EVACUATION N/A 10/27/2016   Procedure: DILATATION AND EVACUATION;  Surgeon: Waynard Reeds, MD;  Location: [facility] BIRTHING SUITES;  Service: Gynecology;  Laterality: N/A;    OB History  Gravida Para Term Preterm AB Living  3 1 1   1 1   SAB IAB Ectopic Multiple Live Births  1     0 1    # Outcome Date GA Lbr Len/2nd Weight Sex Delivery Anes PTL Lv  3 Current           2 Term 09/11/17 [redacted]w[redacted]d 15:14 / 04:12 3731 g F Vag-Spont EPI  LIV  1 SAB             Social History   Socioeconomic History   Marital status: Married    Spouse name: [redacted]w[redacted]d   Number of children: Not on file   Years of education: Not on file   Highest education level: Not on file  Occupational History   Not on file  Tobacco Use   Smoking status: Never   Smokeless tobacco: Never  Vaping Use   Vaping Use: Never used  Substance and Sexual Activity   Alcohol use: Yes    Comment: rarely   Drug use: No   Sexual activity: Yes    Partners: Male    Birth control/protection: None    Comment: natural family  planning (NFP)  Other Topics Concern   Not on file  Social History Narrative   Grew up in [place], moved here from [place] 2018   Married    Masters degree   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   Social Determinants of Nurse, adult Strain: Not on file  Food Insecurity: Not on file  Transportation Needs: Not on file  Physical Activity: Not on file  Stress: Not on file  Social Connections: Not on file  Intimate Partner Violence: Not on file   Lactose intolerance (gi)    Prenatal Transfer Tool  Maternal Diabetes: No Genetic Screening: Normal Maternal Ultrasounds/Referrals: Normal Fetal Ultrasounds or other Referrals:  None Maternal Substance Abuse:  No Significant Maternal Medications:  Meds include: Other:  pepcid, omeprazole, aspirin 81mg  Significant Maternal Lab Results: Group B Strep negative  ABO, Rh: --/--/O POS (07/06 1950) Antibody: NEG (07/06 1950) Rubella:  Immune RPR:   NR HBsAg:   Negative HIV:   Negative GBS:   negative    Vitals:  07/24/20 2132 07/24/20 2147  BP: 138/84 (!) 144/85  Pulse: (!) 105 98  Resp:    Temp:       General:  NAD Abdomen:  soft, gravid, EFW 8# Ex:  1+ edema bilaterally, + compression socks SVE:  2-3/50/-2 per RN FHTs:  120s, moderate variability, category 1 Toco:  irregular   A/P   35 y.o. G3P1011 [redacted]w[redacted]d presents for  induction of labor for advanced maternal age Admit to L&D Cervix is favorable--start pitocin Covid positive--symptoms improving New onset hypertension--asymptomatic, blood pressure mild range.  Will add on CMP.  Will monitor closely for severe range BP  FSR/ vtx/ GBS negative  Roxanne Panek GEFFEL Pearson Reasons

## 2020-07-25 ENCOUNTER — Inpatient Hospital Stay (HOSPITAL_COMMUNITY): Payer: No Typology Code available for payment source | Admitting: Anesthesiology

## 2020-07-25 ENCOUNTER — Encounter (HOSPITAL_COMMUNITY): Payer: Self-pay | Admitting: Obstetrics

## 2020-07-25 ENCOUNTER — Other Ambulatory Visit: Payer: Self-pay

## 2020-07-25 LAB — CBC
HCT: 33.1 % — ABNORMAL LOW (ref 36.0–46.0)
Hemoglobin: 10.7 g/dL — ABNORMAL LOW (ref 12.0–15.0)
MCH: 28.8 pg (ref 26.0–34.0)
MCHC: 32.3 g/dL (ref 30.0–36.0)
MCV: 89.2 fL (ref 80.0–100.0)
Platelets: 205 10*3/uL (ref 150–400)
RBC: 3.71 MIL/uL — ABNORMAL LOW (ref 3.87–5.11)
RDW: 14 % (ref 11.5–15.5)
WBC: 7 10*3/uL (ref 4.0–10.5)
nRBC: 0 % (ref 0.0–0.2)

## 2020-07-25 LAB — RPR: RPR Ser Ql: NONREACTIVE

## 2020-07-25 MED ORDER — SIMETHICONE 80 MG PO CHEW: 80.0000 mg | CHEWABLE_TABLET | ORAL | Status: DC | PRN

## 2020-07-25 MED ORDER — TRANEXAMIC ACID-NACL 1000-0.7 MG/100ML-% IV SOLN
INTRAVENOUS | Status: AC
  Administered 2020-07-25: 1000 mg via INTRAVENOUS
  Filled 2020-07-25: qty 100

## 2020-07-25 MED ORDER — OXYCODONE HCL 5 MG PO TABS: 5.0000 mg | ORAL_TABLET | ORAL | Status: DC | PRN

## 2020-07-25 MED ORDER — OXYCODONE HCL 5 MG PO TABS: 10.0000 mg | ORAL_TABLET | ORAL | Status: DC | PRN

## 2020-07-25 MED ORDER — CEFAZOLIN SODIUM-DEXTROSE 2-4 GM/100ML-% IV SOLN
2.0000 g | Freq: Once | INTRAVENOUS | Status: AC
  Administered 2020-07-25: 2 g via INTRAVENOUS
  Filled 2020-07-25: qty 100

## 2020-07-25 MED ORDER — MISOPROSTOL 200 MCG PO TABS: 1000.0000 ug | ORAL_TABLET | Freq: Once | ORAL | Status: AC

## 2020-07-25 MED ORDER — SENNOSIDES-DOCUSATE SODIUM 8.6-50 MG PO TABS
2.0000 | ORAL_TABLET | Freq: Every day | ORAL | Status: DC
  Administered 2020-07-26 – 2020-07-27 (×2): 2 via ORAL
  Filled 2020-07-25 (×2): qty 2

## 2020-07-25 MED ORDER — LIDOCAINE HCL (PF) 1 % IJ SOLN
INTRAMUSCULAR | Status: DC | PRN
  Administered 2020-07-25: 11 mL via EPIDURAL

## 2020-07-25 MED ORDER — ACETAMINOPHEN 325 MG PO TABS: 650.0000 mg | ORAL_TABLET | ORAL | Status: DC | PRN

## 2020-07-25 MED ORDER — TETANUS-DIPHTH-ACELL PERTUSSIS 5-2.5-18.5 LF-MCG/0.5 IM SUSY: 0.5000 mL | PREFILLED_SYRINGE | Freq: Once | INTRAMUSCULAR | Status: DC

## 2020-07-25 MED ORDER — BENZOCAINE-MENTHOL 20-0.5 % EX AERO: 1.0000 "application " | INHALATION_SPRAY | CUTANEOUS | Status: DC | PRN

## 2020-07-25 MED ORDER — DIPHENHYDRAMINE HCL 25 MG PO CAPS: 25.0000 mg | ORAL_CAPSULE | Freq: Four times a day (QID) | ORAL | Status: DC | PRN

## 2020-07-25 MED ORDER — MISOPROSTOL 200 MCG PO TABS
ORAL_TABLET | ORAL | Status: AC
  Administered 2020-07-25: 1000 ug via RECTAL
  Filled 2020-07-25: qty 5

## 2020-07-25 MED ORDER — WITCH HAZEL-GLYCERIN EX PADS: 1.0000 "application " | MEDICATED_PAD | CUTANEOUS | Status: DC | PRN

## 2020-07-25 MED ORDER — ZOLPIDEM TARTRATE 5 MG PO TABS: 5.0000 mg | ORAL_TABLET | Freq: Every evening | ORAL | Status: DC | PRN

## 2020-07-25 MED ORDER — OXYTOCIN-SODIUM CHLORIDE 30-0.9 UT/500ML-% IV SOLN: 2.5000 [IU]/h | INTRAVENOUS | Status: DC | PRN

## 2020-07-25 MED ORDER — ONDANSETRON HCL 4 MG PO TABS: 4.0000 mg | ORAL_TABLET | ORAL | Status: DC | PRN

## 2020-07-25 MED ORDER — COCONUT OIL OIL
1.0000 "application " | TOPICAL_OIL | Status: DC | PRN
  Administered 2020-07-26: 1 via TOPICAL

## 2020-07-25 MED ORDER — IBUPROFEN 600 MG PO TABS
600.0000 mg | ORAL_TABLET | Freq: Four times a day (QID) | ORAL | Status: DC
  Administered 2020-07-25 – 2020-07-27 (×6): 600 mg via ORAL
  Filled 2020-07-25 (×6): qty 1

## 2020-07-25 MED ORDER — ONDANSETRON HCL 4 MG/2ML IJ SOLN: 4.0000 mg | INTRAMUSCULAR | Status: DC | PRN

## 2020-07-25 MED ORDER — DIBUCAINE (PERIANAL) 1 % EX OINT: 1.0000 "application " | TOPICAL_OINTMENT | CUTANEOUS | Status: DC | PRN

## 2020-07-25 MED ORDER — PRENATAL MULTIVITAMIN CH
1.0000 | ORAL_TABLET | Freq: Every day | ORAL | Status: DC
  Administered 2020-07-26: 1 via ORAL
  Filled 2020-07-25: qty 1

## 2020-07-25 MED ORDER — TRANEXAMIC ACID-NACL 1000-0.7 MG/100ML-% IV SOLN: 1000.0000 mg | INTRAVENOUS | Status: AC

## 2020-07-25 NOTE — Lactation Note (Signed)
This note was copied from a baby's chart. Lactation Consultation Note  Patient Name: Girl Anastassia Noack [id number] Date: 07/25/2020   Age:35 hours   LC attempted to assist in L&D.  RN stated it wasn't a good time as it was a difficult delivery.  LC to F/U on MBU.   Judee Clara 07/25/2020, 6:45 PM

## 2020-07-25 NOTE — Progress Notes (Signed)
Patient ID: Haley Washington, female   DOB: [birth year], 35 y.o.   MRN: [id number]  S: Comfortable with epidural O:  Vitals:   07/25/20 1112 07/25/20 1118 07/25/20 1122 07/25/20 1126  BP: 127/85 113/88 108/66   Pulse: 94 (!) 103 (!) 102   Resp:   16   Temp:   (!) 97.4 F (36.3 C)   TempSrc:   Axillary   SpO2:    99%  Weight:      Height:       AOx3, NAD FHR 130 reactive, cat 1 tracing Dilation: 5 Effacement (%): 80 Station: -2 Presentation: Vertex Exam by:: Dr. Murlean Hark irreg Q2-4  Cont Pit FWB reassuring

## 2020-07-25 NOTE — Progress Notes (Signed)
   07/25/20 1649  Clinical Encounter Type  Visited With Health care provider  Visit Type Initial;Code  Referral From Other (Comment) Administrator, arts)  Consult/Referral To Chaplain   Chaplain responded to Code Apgar. Pt and baby were doing well and chaplain not needed.  This note was prepared by Chaplain Resident, Tacy Learn, MDiv. Chaplain remains available as needed through the on-call pager: [phone].

## 2020-07-25 NOTE — Anesthesia Procedure Notes (Signed)
Epidural Patient location during procedure: OB Start time: 07/25/2020 10:35 AM End time: 07/25/2020 10:56 AM  Staffing Anesthesiologist: Lowella Curb, MD Performed: anesthesiologist   Preanesthetic Checklist Completed: patient identified, IV checked, site marked, risks and benefits discussed, surgical consent, monitors and equipment checked, pre-op evaluation and timeout performed  Epidural Patient position: sitting Prep: ChloraPrep Patient monitoring: heart rate, cardiac monitor, continuous pulse ox and blood pressure Approach: midline Location: L2-L3 Injection technique: LOR saline  Needle:  Needle type: Tuohy  Needle gauge: 17 G Needle length: 9 cm Needle insertion depth: 7 cm Catheter type: closed end flexible Catheter size: 20 Guage Catheter at skin depth: 12 cm Test dose: negative  Assessment Events: blood not aspirated, injection not painful, no injection resistance, no paresthesia and negative IV test  Additional Notes Reason for block:procedure for pain

## 2020-07-25 NOTE — Anesthesia Preprocedure Evaluation (Signed)
Anesthesia Evaluation  Patient identified by MRN, date of birth, ID band Patient awake    Reviewed: Allergy & Precautions, H&P , NPO status , Patient's Chart, lab work & pertinent test results  Airway Mallampati: III  TM Distance: >3 FB Neck ROM: full    Dental no notable dental hx. (+) Teeth Intact   Pulmonary neg pulmonary ROS,    Pulmonary exam normal breath sounds clear to auscultation       Cardiovascular negative cardio ROS Normal cardiovascular exam Rhythm:regular Rate:Normal     Neuro/Psych negative neurological ROS  negative psych ROS   GI/Hepatic negative GI ROS, Neg liver ROS,   Endo/Other  Morbid obesity  Renal/GU negative Renal ROS  negative genitourinary   Musculoskeletal negative musculoskeletal ROS (+)   Abdominal (+) + obese,   Peds  Hematology negative hematology ROS (+)   Anesthesia Other Findings Covid +  Reproductive/Obstetrics (+) Pregnancy                             Anesthesia Physical  Anesthesia Plan  ASA: III  Anesthesia Plan: Epidural   Post-op Pain Management:    Induction:   PONV Risk Score and Plan:   Airway Management Planned:   Additional Equipment:   Intra-op Plan:   Post-operative Plan:   Informed Consent: I have reviewed the patients History and Physical, chart, labs and discussed the procedure including the risks, benefits and alternatives for the proposed anesthesia with the patient or authorized representative who has indicated his/her understanding and acceptance.       Plan Discussed with:   Anesthesia Plan Comments:         Anesthesia Quick Evaluation

## 2020-07-25 NOTE — Progress Notes (Signed)
Patient with mild discomfort with contractions.  Asymptomatic still from elevated BPs  BP 133/85   Pulse 92   Temp 98.1 F (36.7 C) (Oral)   Resp 16   Ht 5\' 5"  (1.651 m)   Wt 128.6 kg   LMP 10/16/2019   BMI 47.19 kg/m   Toco: q3-4 minutes EFM: 120s, moderate variability, + accels, category 1 SVE: 4/70/-2 / posterior.    A/P: G3P1011 @ [redacted]w[redacted]d w IOL for AMA and now new onset GHTN Continue pitocin Attempted AROM but no fluid returned and difficult exam due to patient discomfort.  Will reassess in 1-2 hrs

## 2020-07-26 LAB — CBC
HCT: 29.3 % — ABNORMAL LOW (ref 36.0–46.0)
Hemoglobin: 9.6 g/dL — ABNORMAL LOW (ref 12.0–15.0)
MCH: 29.3 pg (ref 26.0–34.0)
MCHC: 32.8 g/dL (ref 30.0–36.0)
MCV: 89.3 fL (ref 80.0–100.0)
Platelets: 212 10*3/uL (ref 150–400)
RBC: 3.28 MIL/uL — ABNORMAL LOW (ref 3.87–5.11)
RDW: 13.9 % (ref 11.5–15.5)
WBC: 9.3 10*3/uL (ref 4.0–10.5)
nRBC: 0 % (ref 0.0–0.2)

## 2020-07-26 MED ORDER — FERROUS SULFATE 325 (65 FE) MG PO TABS
325.0000 mg | ORAL_TABLET | Freq: Every day | ORAL | Status: DC
  Administered 2020-07-27: 325 mg via ORAL
  Filled 2020-07-26: qty 1

## 2020-07-26 NOTE — Progress Notes (Signed)
Post Partum Day 1 Subjective: Haley Washington is doing well this morning without complaints. She is ambulating, voiding, tolerating PO. No lightheadedness or dizziness. Lochia is significantly improved.   Objective: Patient Vitals for the past 24 hrs:  BP Temp Temp src Pulse Resp SpO2  07/26/20 0400 120/79 97.8 F (36.6 C) Oral 97 17 98 %  07/25/20 2354 121/80 98.9 F (37.2 C) Oral 96 18 98 %  07/25/20 2005 130/80 100.2 F (37.9 C) Oral 99 18 98 %  07/25/20 1902 130/75 (!) 97.2 F (36.2 C) Oral 100 17 98 %  07/25/20 1832 119/88 -- -- (!) 114 -- --  07/25/20 1816 123/82 -- -- (!) 114 -- --  07/25/20 1746 121/78 -- -- (!) 115 -- --  07/25/20 1731 121/76 -- -- (!) 119 -- --  07/25/20 1716 117/73 -- -- (!) 116 -- --  07/25/20 1702 118/77 -- -- -- -- --  07/25/20 1647 126/74 -- -- 89 -- --  07/25/20 1631 120/90 -- -- -- -- --  07/25/20 1617 116/74 -- -- (!) 114 -- --  07/25/20 1602 (!) 130/111 -- -- (!) 126 -- --  07/25/20 1531 125/82 -- -- (!) 101 -- --  07/25/20 1431 125/84 -- -- 98 -- --  07/25/20 1401 123/86 -- -- 97 16 --  07/25/20 1336 110/67 -- -- 93 -- --  07/25/20 1302 104/61 -- -- 87 17 --  07/25/20 1201 112/68 -- -- 90 16 95 %  07/25/20 1131 104/68 -- -- 93 -- 99 %  07/25/20 1126 -- -- -- -- -- 99 %  07/25/20 1122 108/66 (!) 97.4 F (36.3 C) Axillary (!) 102 16 --  07/25/20 1118 113/88 -- -- (!) 103 -- --  07/25/20 1112 127/85 -- -- 94 -- --  07/25/20 1111 -- -- -- -- -- 98 %  07/25/20 1106 128/76 -- -- (!) 103 -- 96 %  07/25/20 1101 120/78 -- -- (!) 104 -- 97 %  07/25/20 1057 122/78 -- -- 97 -- --  07/25/20 1056 122/78 -- -- 97 -- 98 %  07/25/20 1052 (!) 142/78 -- -- 95 -- --  07/25/20 1048 (!) 142/115 -- -- (!) 102 -- --  07/25/20 1046 -- -- -- -- -- (!) 89 %    Physical Exam:  General: alert, cooperative, and no distress Lochia: appropriate Uterine Fundus: firm DVT Evaluation: No evidence of DVT seen on physical exam.  Recent Labs    07/24/20 1933  07/25/20 0521 07/26/20 0634  WBC 6.6 7.0 9.3  HGB 10.9* 10.7* 9.6*  HCT 33.4* 33.1* 29.3*  PLT 226 205 212    Recent Labs    07/24/20 1933  NA 137  K 3.3*  CL 105  BUN 16  CREATININE 0.61  GLUCOSE 102*  BILITOT 0.4  ALT 74*  AST 40  ALKPHOS 180*  PROT 5.9*  ALBUMIN 2.5*    Recent Labs    07/24/20 1933  CALCIUM 8.6*    No results for input(s): PROTIME, APTT, INR in the last 72 hours.  No results for input(s): PROTIME, APTT, INR, FIBRINOGEN in the last 72 hours. Assessment/Plan:  Haley Washington 35 y.o. [id number] PPD#1 sp SVD 1. PPC: continue routine postpartum care 2. PPH (EBL 1100), hgb this AM is 9.6. She is asymptomatic. Start PO iron.  3. COVID +: improving, continue isolation 4. HTN on admission: normotensive since delivery, continue to monitor 5. Rh pos, rubella immune, s/p tdap and COVID vaccines prenatally 6. Dispo: plan for  discharge home tomorrow   LOS: 2 days   Haley Washington 07/26/2020, 10:31 AM

## 2020-07-26 NOTE — Anesthesia Postprocedure Evaluation (Signed)
Anesthesia Post Note  Patient: YUI MULVANEY  Procedure(s) Performed: AN AD HOC LABOR EPIDURAL     Patient location during evaluation: Mother Baby Anesthesia Type: Epidural Level of consciousness: awake and alert and oriented Pain management: satisfactory to patient Vital Signs Assessment: post-procedure vital signs reviewed and stable Respiratory status: respiratory function stable Cardiovascular status: stable Postop Assessment: no headache, no backache, epidural receding, patient able to bend at knees, no signs of nausea or vomiting, adequate PO intake and able to ambulate Anesthetic complications: no   No notable events documented.  Last Vitals:  Vitals:   07/25/20 2354 07/26/20 0400  BP: 121/80 120/79  Pulse: 96 97  Resp: 18 17  Temp: 37.2 C 36.6 C  SpO2: 98% 98%    Last Pain:  Vitals:   07/26/20 0552  TempSrc:   PainSc: 0-No pain   Pain Goal:                   Daymein Nunnery

## 2020-07-26 NOTE — Lactation Note (Signed)
This note was copied from a baby's chart. Lactation Consultation Note  Patient Name: Haley Washington [id number] Date: 07/26/2020 Reason for consult: Initial assessment;Term Age:35 hours, infant had one void and 2 stools since birth. Late order put in on [facility] for [facility] services. Per mom, she did not BF her 1st child, she attempted she pumped only for 2 months.  Mom made multiple attempts to latch infant at first on her left breast using the football hold position, infant did not sustain latch.  LC observed mom had flat inverted nipples both breast, that flattens inward when stimulated. Mom fitted with 24 mm NS, mom re-latched infant on her left breast using the football hold position, infant started sustaining latch and was still BF after 10 minutes when LC left the room. Mom was set up with DEBP, but prefers using pump every 4 hours  for 15 minutes on initial setting instead of 3 hours. Mom knows to call RN or LC if she needs further assistance with latching infant at the breast.  LC discussed infant's input and output with mom. Mom will continue to BF infant according to feeding cues, 8 to 12 or more times within 24 hours, STS. Mom made aware of O/P services, breastfeeding support groups, community resources, and our phone # for post-discharge questions.   Maternal Data Has patient been taught Hand Expression?: Yes Does the patient have breastfeeding experience prior to this delivery?: No  Feeding Mother's Current Feeding Choice: Breast Milk and Formula  LATCH Score Latch: Grasps breast easily, tongue down, lips flanged, rhythmical sucking.  Audible Swallowing: A few with stimulation  Type of Nipple: Inverted (Mom has flat and inverted nipples.)  Comfort (Breast/Nipple): Soft / non-tender  Hold (Positioning): Assistance needed to correctly position infant at breast and maintain latch.  LATCH Score: 6   Lactation Tools Discussed/Used Tools: Shells;Nipple Shields Nipple shield size:  24  Interventions Interventions: Breast feeding basics reviewed;Assisted with latch;Skin to skin;Hand express;Breast compression;Pre-pump if needed;Adjust position;Support pillows;Position options;Expressed milk;Hand pump;DEBP;Education;Shells  Discharge Pump: DEBP;Manual WIC Program: No  Consult Status Consult Status: Follow-up Date: 07/27/20 Follow-up type: In-patient    Danelle Earthly 07/26/2020, 2:56 AM

## 2020-07-27 ENCOUNTER — Ambulatory Visit: Payer: Self-pay

## 2020-07-27 MED ORDER — DOCUSATE SODIUM 100 MG PO CAPS: 100.0000 mg | ORAL_CAPSULE | Freq: Two times a day (BID) | ORAL | 0 refills | Status: DC

## 2020-07-27 MED ORDER — ACETAMINOPHEN 325 MG PO TABS: 650.0000 mg | ORAL_TABLET | Freq: Four times a day (QID) | ORAL | Status: DC | PRN

## 2020-07-27 MED ORDER — IBUPROFEN 200 MG PO TABS: 600.0000 mg | ORAL_TABLET | Freq: Four times a day (QID) | ORAL | Status: DC | PRN

## 2020-07-27 MED ORDER — FERROUS SULFATE 325 (65 FE) MG PO TABS: 325.0000 mg | ORAL_TABLET | Freq: Every day | ORAL | 3 refills | Status: DC

## 2020-07-27 NOTE — Lactation Note (Signed)
This note was copied from a baby's chart. Lactation Consultation Note  Patient Name: Haley Washington [id number] Date: 07/27/2020 Reason for consult: Follow-up assessment;Term;1st time breastfeeding;Infant weight loss (-2% weight loss) Age:35 hours, term female infant with -2% weight loss. Per mom, she is formula feeding only tonight due having sore breast with abrasions. Infant was recently given 33 mls of formula with slow flow bottle nipple this was mom's choice and LEAD was done by RN.  Mom doesn't desire to use DEBP, mom plans to use her pump at home once she is discharge. Per mom ,she was told not to use the 24 NS and  now her breast are sore,  cracked, bruised and bleeding. LC suggested mom to apply her EBM on breast and let air dry and use comfort gels to help with healing. If mom would like further assistance with latching infant at the breast [facility] services is available to help mom in the morning.  Per mom, she receives emails from [facility] BF support group and plans to attend a few virtual meetings online, LC discussed the [facility] outpatient clinic as well.   Maternal Data    Feeding Mother's Current Feeding Choice: Breast Milk and Formula Nipple Type: Extra Slow Flow  LATCH Score                    Lactation Tools Discussed/Used    Interventions Interventions: Skin to skin;Education;DEBP;Expressed milk;Coconut oil;Comfort gels  Discharge    Consult Status Consult Status: Follow-up Date: 07/27/20 Follow-up type: In-patient    Danelle Earthly 07/27/2020, 12:39 AM

## 2020-07-27 NOTE — Lactation Note (Signed)
This note was copied from a baby's chart. Lactation Consultation Note  Patient Name: Haley Washington Date: 07/27/2020 Reason for consult: Other (Comment) (D/C prior to [facility] visit/ LC reviewed chart/ all bottles) Age:35 hours  Maternal Data    Feeding Mother's Current Feeding Choice: Breast Milk and Formula  LATCH Score                    Lactation Tools Discussed/Used    Interventions    Discharge    Consult Status Consult Status: Complete Date: 07/27/20    [name] 07/27/2020, 1:46 PM

## 2020-07-27 NOTE — Progress Notes (Signed)
Post Partum Day 2 Subjective: Haley Washington is doing well this morning without complaints. She is ambulating, voiding, tolerating PO. No lightheadedness or dizziness. Lochia is significantly minimal.   Objective: Patient Vitals for the past 24 hrs:  BP Temp Temp src Pulse Resp SpO2  07/27/20 0638 129/89 97.6 F (36.4 C) Oral 84 18 100 %  07/26/20 2126 133/62 98.9 F (37.2 C) Oral 79 18 99 %  07/26/20 1345 138/90 99.8 F (37.7 C) Oral 94 18 --    Physical Exam:  General: alert, cooperative, and no distress Lochia: appropriate Uterine Fundus: firm DVT Evaluation: No evidence of DVT seen on physical exam.  Recent Labs    07/24/20 1933 07/25/20 0521 07/26/20 0634  WBC 6.6 7.0 9.3  HGB 10.9* 10.7* 9.6*  HCT 33.4* 33.1* 29.3*  PLT 226 205 212    Recent Labs    07/24/20 1933  NA 137  K 3.3*  CL 105  BUN 16  CREATININE 0.61  GLUCOSE 102*  BILITOT 0.4  ALT 74*  AST 40  ALKPHOS 180*  PROT 5.9*  ALBUMIN 2.5*    Recent Labs    07/24/20 1933  CALCIUM 8.6*    No results for input(s): PROTIME, APTT, INR in the last 72 hours.  No results for input(s): PROTIME, APTT, INR, FIBRINOGEN in the last 72 hours. Assessment/Plan:  Haley Washington 35 y.o. [id number] PPD# sp SVD 1. PPC: continue routine postpartum care 2. PPH (EBL 1100), hgb this AM is 9.6. She is asymptomatic. Continue PO iron. 3. COVID +: improving, continue isolation 4. HTN on admission: normotensive since delivery, continue to monitor 5. Rh pos, rubella immune, s/p tdap and COVID vaccines prenatally 6. Dispo: discharge home, discharge instructions reviewed   LOS: 3 days   [name] 07/27/2020, 8:38 AM

## 2020-07-27 NOTE — Discharge Summary (Signed)
Postpartum Discharge Summary  Date of Service updated 07/27/20      Patient Name: Haley Washington DOB: [birth year] MRN: [id number]  Date of admission: 07/24/2020 Delivery date:07/25/2020  Delivering provider: Vanessa Kick  Date of discharge: 07/27/2020  Admitting diagnosis: Advanced maternal age in multigravida, third trimester [O09.523] Spontaneous vaginal delivery [O80] Intrauterine pregnancy: [redacted]w[redacted]d    Secondary diagnosis:  Active Problems:   Advanced maternal age in multigravida, third trimester   Spontaneous vaginal delivery  Additional problems: Obesity, COVID + on 7/2    Discharge diagnosis: Term Pregnancy Delivered, Gestational Hypertension, Anemia, and PPH                                              Post partum procedures: none Augmentation: Pitocin Complications: [id number] Hospital course: Induction of Labor With Vaginal Delivery   35y.o. yo [id number] admitted to the hospital 07/24/2020 for induction of labor.  Indication for induction: AMA.  Patient had an uncomplicated labor course as follows: Membrane Rupture Time/Date: 6:03 AM ,07/25/2020   Delivery Method:Vaginal, Spontaneous  Episiotomy: None  Lacerations:  None  Details of delivery can be found in separate delivery note. Postpartum hemorrhage ~ 110061mue to uterine atony treated with TXA and cytotec, after which patient had a normal  postpartum course. Patient is discharged home 07/27/20.  Newborn Data: Birth date:07/25/2020  Birth time:4:01 PM  Gender:Female  Living status:Living  Apgars:6 ,9  Weight:4080 g   Magnesium Sulfate received: No BMZ received: No Rhophylac:N/A MMR:N/A T-DaP:Given prenatally Flu: No Transfusion:No  Physical exam  Vitals:   07/26/20 0830 07/26/20 1345 07/26/20 2126 07/27/20 0638  BP: 119/83 138/90 133/62 129/89  Pulse: 91 94 79 84  Resp: _0 Temp: 97.9 F (36.6 C) 99.8 F (37.7 C) 98.9 F (37.2 C) 97.6 F (36.4 C)  TempSrc: Oral Oral Oral  Oral  SpO2:   99% 100%  Weight:      Height:       General: alert, cooperative, and no distress Lochia: appropriate Uterine Fundus: firm Incision: N/A DVT Evaluation: No evidence of DVT seen on physical exam. Labs: Lab Results  Component Value Date   WBC 9.3 07/26/2020   HGB 9.6 (L) 07/26/2020   HCT 29.3 (L) 07/26/2020   MCV 89.3 07/26/2020   PLT 212 07/26/2020   CMP Latest Ref Rng & Units 07/24/2020  Glucose 70 - 99 mg/dL 102(H)  BUN 6 - 20 mg/dL 16  Creatinine 0.44 - 1.00 mg/dL 0.61  Sodium 135 - 145 mmol/L 137  Potassium 3.5 - 5.1 mmol/L 3.3(L)  Chloride 98 - 111 mmol/L 105  CO2 22 - 32 mmol/L 22  Calcium 8.9 - 10.3 mg/dL 8.6(L)  Total Protein 6.5 - 8.1 g/dL 5.9(L)  Total Bilirubin 0.3 - 1.2 mg/dL 0.4  Alkaline Phos 38 - 126 U/L 180(H)  AST 15 - 41 U/L 40  ALT 0 - 44 U/L 74(H)   EdiFlavia Shipperore: Edinburgh Postnatal Depression Scale Screening Tool 07/25/2020  I have been able to laugh and see the funny side of things. 0  I have looked forward with enjoyment to things. 0  I have blamed myself unnecessarily when things went wrong. 1  I have been anxious or worried for no good reason. 2  I have felt scared or panicky for no good reason. 0  Things have been getting on top of me. 1  I have been so unhappy that I have had difficulty sleeping. 0  I have felt sad or miserable. 0  I have been so unhappy that I have been crying. 0  The thought of harming myself has occurred to me. 0  Edinburgh Postnatal Depression Scale Total 4      After visit meds:  Allergies as of 07/27/2020       Reactions   Lactose Intolerance (gi) Other (See Comments)   GI upset        Medication List     STOP taking these medications    aspirin EC 81 MG tablet   azithromycin 250 MG tablet Commonly known as: Zithromax Z-Pak   promethazine 25 MG tablet Commonly known as: PHENERGAN   promethazine-dextromethorphan 6.25-15 MG/5ML syrup Commonly known as: PROMETHAZINE-DM       TAKE  these medications    acetaminophen 325 MG tablet Commonly known as: Tylenol Take 2 tablets (650 mg total) by mouth every 6 (six) hours as needed (for pain scale < 4).   cetirizine 10 MG tablet Commonly known as: ZYRTEC Take 10 mg by mouth daily.   docusate sodium 100 MG capsule Commonly known as: Colace Take 1 capsule (100 mg total) by mouth 2 (two) times daily.   ferrous sulfate 325 (65 FE) MG tablet Take 1 tablet (325 mg total) by mouth daily with breakfast.   ibuprofen 200 MG tablet Commonly known as: ADVIL Take 3 tablets (600 mg total) by mouth every 6 (six) hours as needed for cramping.   mometasone 50 MCG/ACT nasal spray Commonly known as: NASONEX Use one to two sprays in each nostril once daily as directed. What changed:  how much to take how to take this when to take this   prenatal multivitamin Tabs tablet Take 1 tablet by mouth daily at 12 noon.         Discharge home in stable condition Infant Feeding: Breast Infant Disposition:home with mother Discharge instruction: per After Visit Summary and Postpartum booklet. Activity: Advance as tolerated. Pelvic rest for 6 weeks.  Diet: routine diet Anticipated Birth Control: Unsure Postpartum Appointment:4 weeks Additional Postpartum F/U:  none Future Appointments:No future appointments. Follow up Visit:  Follow-up Information     Vanessa Kick, MD Follow up in 4 week(s).   Specialty: Obstetrics and Gynecology Contact information: [address] [place] [address] [phone]                     07/27/2020 Rowland Lathe, MD

## 2020-08-07 ENCOUNTER — Telehealth (HOSPITAL_COMMUNITY): Payer: Self-pay

## 2020-08-07 NOTE — Telephone Encounter (Signed)
Follow-up discharge call.  Patient doing well, pumping breast milk and supplementing with formula. Baby doing well and has been to the pediatrician.  Safe sleep reviewed.  Registered for the [facility] Me group.  EPDS 1

## 2020-09-12 ENCOUNTER — Encounter: Payer: Self-pay | Admitting: Family Medicine

## 2020-10-08 ENCOUNTER — Encounter: Payer: Self-pay | Admitting: Family Medicine

## 2020-10-28 ENCOUNTER — Ambulatory Visit: Payer: No Typology Code available for payment source

## 2021-03-08 ENCOUNTER — Ambulatory Visit
Admission: EM | Admit: 2021-03-08 | Discharge: 2021-03-08 | Disposition: A | Discharge status: Home / Self Care | Payer: No Typology Code available for payment source | Attending: Urgent Care | Admitting: Urgent Care

## 2021-03-08 ENCOUNTER — Encounter: Payer: Self-pay | Admitting: Emergency Medicine

## 2021-03-08 ENCOUNTER — Other Ambulatory Visit: Payer: Self-pay

## 2021-03-08 DIAGNOSIS — J029 Acute pharyngitis, unspecified: Secondary | ICD-10-CM

## 2021-03-08 DIAGNOSIS — Z9189 Other specified personal risk factors, not elsewhere classified: Secondary | ICD-10-CM

## 2021-03-08 DIAGNOSIS — R509 Fever, unspecified: Secondary | ICD-10-CM

## 2021-03-08 DIAGNOSIS — R030 Elevated blood-pressure reading, without diagnosis of hypertension: Secondary | ICD-10-CM

## 2021-03-08 DIAGNOSIS — B349 Viral infection, unspecified: Secondary | ICD-10-CM | POA: Diagnosis not present

## 2021-03-08 DIAGNOSIS — R000 Tachycardia, unspecified: Secondary | ICD-10-CM

## 2021-03-08 LAB — POCT RAPID STREP A (OFFICE): Rapid Strep A Screen: POSITIVE — AB

## 2021-03-08 MED ORDER — ACETAMINOPHEN 500 MG PO TABS
500.0000 mg | ORAL_TABLET | Freq: Once | ORAL | Status: AC
  Administered 2021-03-08: 500 mg via ORAL

## 2021-03-08 MED ORDER — NAPROXEN 500 MG PO TABS: 500.0000 mg | ORAL_TABLET | Freq: Two times a day (BID) | ORAL | 0 refills | Status: DC

## 2021-03-08 MED ORDER — AMOXICILLIN 500 MG PO CAPS: 500.0000 mg | ORAL_CAPSULE | Freq: Two times a day (BID) | ORAL | 0 refills | Status: DC

## 2021-03-08 MED ORDER — PROMETHAZINE-DM 6.25-15 MG/5ML PO SYRP: 5.0000 mL | ORAL_SOLUTION | Freq: Four times a day (QID) | ORAL | 0 refills | Status: DC | PRN

## 2021-03-08 NOTE — ED Triage Notes (Signed)
Started to feel lightheaded on Wednesday with sore throat.  Productive cough with yellow sputum with some nasal congestion and body aches.  Had a virtual visit on Thursday and was given benzonatate cough medication and nasal spray.  States meds have not been helping.

## 2021-03-08 NOTE — ED Provider Notes (Signed)
[facility]   MRN: [id number] DOB: 01/16/1986  Subjective:   Haley Washington is a 36 y.o. female presenting for 4-day history of acute onset persistent and worsening throat pain, painful swallowing.  Patient has also had sinus congestion, persistent coughing with chest pain and shortness of breath, body aches.  She did a virtual visit on Thursday and was prescribed medications that have not helped with her symptoms.  Has a past medical history of allergic rhinitis. No smoking.  No history of respiratory disorders.  She last had COVID in July of last year.   Current Facility-Administered Medications:    acetaminophen (TYLENOL) tablet 500 mg, 500 mg, Oral, Once, Wallis Bamberg, PA-C  Current Outpatient Medications:    acetaminophen (TYLENOL) 325 MG tablet, Take 2 tablets (650 mg total) by mouth every 6 (six) hours as needed (for pain scale < 4)., Disp: , Rfl:    cetirizine (ZYRTEC) 10 MG tablet, Take 10 mg by mouth daily., Disp: , Rfl:    docusate sodium (COLACE) 100 MG capsule, Take 1 capsule (100 mg total) by mouth 2 (two) times daily., Disp: 10 capsule, Rfl: 0   ferrous sulfate 325 (65 FE) MG tablet, Take 1 tablet (325 mg total) by mouth daily with breakfast., Disp: , Rfl: 3   ibuprofen (ADVIL) 200 MG tablet, Take 3 tablets (600 mg total) by mouth every 6 (six) hours as needed for cramping., Disp: , Rfl:    mometasone (NASONEX) 50 MCG/ACT nasal spray, Use one to two sprays in each nostril once daily as directed., Disp: 51 g, Rfl: 0   Prenatal Vit-Fe Fumarate-FA (PRENATAL MULTIVITAMIN) TABS tablet, Take 1 tablet by mouth daily at 12 noon., Disp: , Rfl:    Allergies  Allergen Reactions   Lactose Intolerance (Gi) Other (See Comments)    GI upset    Past Medical History:  Diagnosis Date   Acne    has been on aldactone in the past.  Also tried Accutane without improvement in her symptoms    Allergy    History of chicken pox    Hx: UTI (urinary tract infection)     Hyperlipidemia    Low back pain 2017   improved with physical therapy   Morbid obesity with BMI of 40.0-44.9, adult [facility])      Past Surgical History:  Procedure Laterality Date   ADENOIDECTOMY     DILATION AND EVACUATION N/A 10/27/2016   Procedure: DILATATION AND EVACUATION;  Surgeon: Waynard Reeds, MD;  Location: [facility];  Service: Gynecology;  Laterality: N/A;    Family History  Problem Relation Age of Onset   Hyperlipidemia Mother    Hypertension Mother    Obesity Mother    Alcohol abuse Father        recurring   Obesity Brother    Mental illness Maternal Grandmother        anxiety   Arthritis Maternal Grandfather    Cancer Maternal Grandfather        colon, diagnosed in 75's   Diabetes Maternal Grandfather    Heart disease Paternal Grandfather    Cancer Paternal Grandfather        lymphoma, ? lung CA- smoker    Social History   Tobacco Use   Smoking status: Never   Smokeless tobacco: Never  Vaping Use   Vaping Use: Never used  Substance Use Topics   Alcohol use: Yes    Comment: rarely   Drug use: No    ROS  Objective:   Vitals: BP (!) 173/91 (BP Location: Right Arm)    Pulse (!) 132    Temp (!) 100.7 F (38.2 C) (Oral)    Resp 18    SpO2 96%   Physical Exam Constitutional:      General: She is not in acute distress.    Appearance: Normal appearance. She is well-developed and normal weight. She is not ill-appearing, toxic-appearing or diaphoretic.  HENT:     Head: Normocephalic and atraumatic.     Right Ear: Tympanic membrane, ear canal and external ear normal. No drainage or tenderness. No middle ear effusion. There is no impacted cerumen. Tympanic membrane is not erythematous.     Left Ear: Tympanic membrane, ear canal and external ear normal. No drainage or tenderness.  No middle ear effusion. There is no impacted cerumen. Tympanic membrane is not erythematous.     Nose: Nose normal. No congestion or rhinorrhea.     Mouth/Throat:      Mouth: Mucous membranes are moist. No oral lesions.     Pharynx: Pharyngeal swelling, posterior oropharyngeal erythema and uvula swelling present. No oropharyngeal exudate.     Tonsils: No tonsillar exudate or tonsillar abscesses.   Eyes:     General: No scleral icterus.       Right eye: No discharge.        Left eye: No discharge.     Extraocular Movements: Extraocular movements intact.     Right eye: Normal extraocular motion.     Left eye: Normal extraocular motion.     Conjunctiva/sclera: Conjunctivae normal.  Cardiovascular:     Rate and Rhythm: Normal rate.     Heart sounds: No murmur heard.   No friction rub. No gallop.  Pulmonary:     Effort: Pulmonary effort is normal. No respiratory distress.     Breath sounds: No stridor. No wheezing, rhonchi or rales.  Chest:     Chest wall: No tenderness.  Musculoskeletal:     Cervical back: Normal range of motion and neck supple.  Lymphadenopathy:     Cervical: No cervical adenopathy.  Skin:    General: Skin is warm and dry.  Neurological:     General: No focal deficit present.     Mental Status: She is alert and oriented to person, place, and time.  Psychiatric:        Mood and Affect: Mood normal.        Behavior: Behavior normal.   Patient given Tylenol in clinic for her fever.   Results for orders placed or performed during the hospital encounter of 03/08/21 (from the past 24 hour(s))  POCT rapid strep A     Status: Abnormal   Collection Time: 03/08/21  8:18 AM  Result Value Ref Range   Rapid Strep A Screen Positive (A) Negative   Assessment and Plan :   PDMP not reviewed this encounter.  1. Acute pharyngitis, unspecified etiology   2. Fever, unspecified   3. Tachycardia   4. Elevated blood pressure reading in office without diagnosis of hypertension   5. At increased risk of exposure to COVID-19 virus    Suspect that the patient's tachycardia is more related to the acute viral syndrome as opposed to an acute  cardiopulmonary event.  Heart score is low.  Deferred chest x-ray as she has a clear cardiopulmonary exam.  Her strep test is positive.  Will manage for acute pharyngitis with amoxicillin.  Recommend supportive care.  Given the rest of her  respiratory symptoms, she warrants COVID and flu testing. Counseled patient on potential for adverse effects with medications prescribed/recommended today, maintain strict ER and return-to-clinic precautions discussed, patient verbalized understanding.    Wallis Bamberg, [place] 03/08/21 [phone]

## 2021-03-09 LAB — COVID-19, FLU A+B NAA
Influenza A, NAA: NOT DETECTED
Influenza B, NAA: NOT DETECTED
SARS-CoV-2, NAA: NOT DETECTED

## 2021-03-10 ENCOUNTER — Ambulatory Visit (INDEPENDENT_AMBULATORY_CARE_PROVIDER_SITE_OTHER): Payer: No Typology Code available for payment source | Admitting: Family Medicine

## 2021-03-10 ENCOUNTER — Telehealth: Payer: Self-pay

## 2021-03-10 ENCOUNTER — Other Ambulatory Visit: Payer: Self-pay

## 2021-03-10 ENCOUNTER — Encounter: Payer: Self-pay | Admitting: Family Medicine

## 2021-03-10 VITALS — BP 116/79 | HR 103 | Temp 98.4°F | Ht 65.0 in | Wt 263.0 lb

## 2021-03-10 DIAGNOSIS — J020 Streptococcal pharyngitis: Secondary | ICD-10-CM | POA: Diagnosis not present

## 2021-03-10 MED ORDER — CEFDINIR 300 MG PO CAPS: 300.0000 mg | ORAL_CAPSULE | Freq: Two times a day (BID) | ORAL | 0 refills | Status: DC

## 2021-03-10 MED ORDER — CEFTRIAXONE SODIUM 1 G IJ SOLR
1.0000 g | Freq: Once | INTRAMUSCULAR | Status: AC
  Administered 2021-03-10: 1 g via INTRAMUSCULAR

## 2021-03-10 MED ORDER — PREDNISONE 50 MG PO TABS: ORAL_TABLET | ORAL | 0 refills | Status: DC

## 2021-03-10 NOTE — Assessment & Plan Note (Signed)
Persistent symptoms and lack of response to amoxicillin.  IM Rocephin given today.  Placing on Omnicef.  Given the severity of her pharyngitis I am also placing her on a brief burst of steroids.

## 2021-03-10 NOTE — Telephone Encounter (Signed)
Nurses I was able to read through Captola's message Certainly I can see how that would be concerning We would be more than happy to recheck her currently today only have late afternoon otherwise it would be Wednesday If she would like to go ahead and schedule for Wednesday to be rechecked that would be fine  Typically strep throat stays isolated to the throat symptoms and does not give discolored phlegm More than likely discolored phlegm coming from a secondary process either virus, sinusitis, or other issue.  I would not add additional medicines currently I would stick with the amoxicillin.  Granted if she improves dramatically over the next couple days she would not necessarily have to come in for recheck. Please communicate with her and see what she would like to do thanks-Dr. Lorin Picket

## 2021-03-10 NOTE — Telephone Encounter (Signed)
Patient scheduled office today with Dr Adriana Simas at 2:50 pm for evaluation

## 2021-03-10 NOTE — Progress Notes (Signed)
Subjective:  Patient ID: Haley Washington, female    DOB: 12-25-1985  Age: 36 y.o. MRN: [id number]  CC: Chief Complaint  Patient presents with   Sore Throat    And cough X 5 days ,coughing up brown phlegm, UC prescribed amox /promethazine cough syrup  Still very painfull to swallow , has white bumps under tongue     HPI:  36 year old female presents for evaluation the above.  Patient recently seen at urgent care on 2/18.  Tested positive for strep.  Husband also has strep.  Patient was placed on amoxicillin, naproxen, and was given promethazine cough syrup.  Patient states that she continues to be quite symptomatic.  She is having difficulty swallowing.  Fever seems to have subsided.  However, she continues to have severe sore throat.  She also reports productive cough.  Patient Active Problem List   Diagnosis Date Noted   Strep pharyngitis 03/10/2021   Pedal edema 01/25/2018   Morbid obesity with BMI of 40.0-44.9, adult (HCC)     Social Hx   Social History   Socioeconomic History   Marital status: Married    Spouse name: Vicente Serene   Number of children: Not on file   Years of education: Not on file   Highest education level: Not on file  Occupational History   Not on file  Tobacco Use   Smoking status: Never   Smokeless tobacco: Never  Vaping Use   Vaping Use: Never used  Substance and Sexual Activity   Alcohol use: Yes    Comment: rarely   Drug use: No   Sexual activity: Yes    Partners: Male    Birth control/protection: None    Comment: natural family planning (NFP)  Other Topics Concern   Not on file  Social History Narrative   Grew up in [place], moved here from [place] 2018   Married    Masters degree   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 (Lbs) 263 - -  BMI 43.77 - -    Physical Exam Vitals and nursing note reviewed.  Constitutional:      Appearance: She is well-developed. She is obese.  HENT:     Head: Normocephalic and atraumatic.     Mouth/Throat:     Comments: Severe oropharyngeal erythema.  Enlarged tonsils with erythema and exudate. Eyes:     General:        Right eye: No discharge.        Left eye: No discharge.     Conjunctiva/sclera: Conjunctivae normal.  Cardiovascular:     Rate and Rhythm: Regular rhythm. Tachycardia present.  Pulmonary:     Effort: Pulmonary effort is normal.     Breath sounds: Normal breath sounds.  Neurological:  Mental Status: She is alert.  Psychiatric:        Mood and Affect: Mood normal.        Behavior: Behavior normal.    Lab Results  Component Value Date   WBC 9.3 07/26/2020   HGB 9.6 (L) 07/26/2020   HCT 29.3 (L) 07/26/2020   PLT 212 07/26/2020   GLUCOSE 102 (H) 07/24/2020   CHOL 186 01/29/2018   TRIG 92 01/29/2018   HDL 52 01/29/2018   LDLCALC 116 (H) 01/29/2018   ALT 74 (H) 07/24/2020   AST 40 07/24/2020   NA 137 07/24/2020   K 3.3 (L) 07/24/2020   CL 105 07/24/2020   CREATININE 0.61 07/24/2020   BUN 16 07/24/2020   CO2 22 07/24/2020   TSH 2.300 01/29/2018   INR 0.95 10/26/2016     Assessment & Plan:   Problem List Items Addressed This Visit       Respiratory   Strep pharyngitis - Primary    Persistent symptoms and lack of response to amoxicillin.  IM Rocephin given today.  Placing on Omnicef.  Given the severity of her pharyngitis I am also placing her on a brief burst of steroids.      Relevant  Medications   cefdinir (OMNICEF) 300 MG capsule    Meds ordered this encounter  Medications   cefTRIAXone (ROCEPHIN) injection 1 g   predniSONE (DELTASONE) 50 MG tablet    Sig: 1 tablet daily x 5 days    Dispense:  5 tablet    Refill:  0   cefdinir (OMNICEF) 300 MG capsule    Sig: Take 1 capsule (300 mg total) by mouth 2 (two) times daily.    Dispense:  20 capsule    Refill:  0    Stavroula Rohde DO [facility]

## 2021-03-10 NOTE — Telephone Encounter (Signed)
Patient informed of md instructions. Verbalized understanding.

## 2021-03-10 NOTE — Patient Instructions (Signed)
Medication as prescribed. ° °Call with concerns. ° °Take care ° °Dr Dariush Mcnellis °

## 2021-03-10 NOTE — Telephone Encounter (Signed)
Pt called in wanting to know if she should be masking up around people. Pt also want to know if she should stop taking all previous medication, including cough syrup. Please advise  Cb#: [phone]

## 2021-04-22 ENCOUNTER — Encounter: Payer: Self-pay | Admitting: Family Medicine

## 2021-04-23 NOTE — Telephone Encounter (Signed)
Nurses ?Please make sure that they get a copy of the shot records for both children ? ?When the forms come in from [place] I will be happy to fill them out ? ?[facility] has several good doctors there.  I will also try to find out pediatric recommendations as well ? ?You may pass this message along with the following onto Haley Washington ? ?Hi Haley Washington ?I know this is a busy time for you and your family.  I certainly wish you the best for yourself and your Haley Washington as well as the kids.  You have a wonderful family.  I am sure living closer to where both of you work will be a long-term benefit. ?I wish you the best ?[facility]-Dr. Lorin Picket ? ?PS-we are here for you until you get established with a new provider thank you ?

## 2021-04-27 ENCOUNTER — Ambulatory Visit
Admission: EM | Admit: 2021-04-27 | Discharge: 2021-04-27 | Disposition: A | Discharge status: Home / Self Care | Payer: BC Managed Care – PPO | Attending: Family Medicine | Admitting: Family Medicine

## 2021-04-27 DIAGNOSIS — J069 Acute upper respiratory infection, unspecified: Secondary | ICD-10-CM

## 2021-04-27 MED ORDER — PROMETHAZINE-DM 6.25-15 MG/5ML PO SYRP: 5.0000 mL | ORAL_SOLUTION | Freq: Four times a day (QID) | ORAL | 0 refills | Status: DC | PRN

## 2021-04-27 MED ORDER — PREDNISONE 20 MG PO TABS: 40.0000 mg | ORAL_TABLET | Freq: Every day | ORAL | 0 refills | Status: DC

## 2021-04-27 NOTE — ED Provider Notes (Signed)
?[facility] ? ? ? ?CSN: [id number] ?Arrival date & time: 04/27/21  [id number] ? ? ?  ? ?History   ?Chief Complaint ?Chief Complaint  ?Patient presents with  ? Cough  ?  Fever and cough  ? ? ?HPI ?Alice RiegerKristen E Ghattas is a 36 y.o. female.  ? ?Presenting today with several day history of hacking cough, congestion, possibly low-grade fevers, fatigue, hoarseness.  Denies chest pain, shortness of breath, abdominal pain, nausea vomiting or diarrhea.  Daughter has been sick for several weeks now with similar symptoms.  Trying Aleve, Tylenol, Mucinex, cough syrup with no relief.  History of seasonal allergies on Xyzal. ? ? ?Past Medical History:  ?Diagnosis Date  ? Acne   ? has been on aldactone in the past.  Also tried Accutane without improvement in her symptoms   ? Allergy   ? History of chicken pox   ? Hx: UTI (urinary tract infection)   ? Hyperlipidemia   ? Low back pain 2017  ? improved with physical therapy  ? Morbid obesity with BMI of 40.0-44.9, adult (HCC)   ? ? ?Patient Active Problem List  ? Diagnosis Date Noted  ? Strep pharyngitis 03/10/2021  ? Pedal edema 01/25/2018  ? Morbid obesity with BMI of 40.0-44.9, adult (HCC)   ? ? ?Past Surgical History:  ?Procedure Laterality Date  ? ADENOIDECTOMY    ? DILATION AND EVACUATION N/A 10/27/2016  ? Procedure: DILATATION AND EVACUATION;  Surgeon: Waynard Reedsoss, Kendra, MD;  [facility];  Service: Gynecology;  Laterality: N/A;  ? ? ?OB History   ? ? Gravida  ?3  ? Para  ?2  ? Term  ?2  ? Preterm  ?   ? AB  ?1  ? Living  ?2  ?  ? ? SAB  ?1  ? IAB  ?   ? Ectopic  ?   ? Multiple  ?0  ? Live Births  ?2  ?   ?  ?  ? ? ? ?Home Medications   ? ?Prior to Admission medications   ?Medication Sig Start Date End Date Taking? Authorizing Provider  ?predniSONE (DELTASONE) 20 MG tablet Take 2 tablets (40 mg total) by mouth daily with breakfast. 04/27/21  Yes Particia NearingLane, Shere Eisenhart Elizabeth, PA-C  ?promethazine-dextromethorphan (PROMETHAZINE-DM) 6.25-15 MG/5ML syrup Take 5 mLs by mouth 4  (four) times daily as needed. 04/27/21  Yes Particia NearingLane, Tonia Avino Elizabeth, PA-C  ?acetaminophen (TYLENOL) 325 MG tablet Take 2 tablets (650 mg total) by mouth every 6 (six) hours as needed (for pain scale < 4). 07/27/20   Charlett NoseMarinone, Michelle E, MD  ?cefdinir (OMNICEF) 300 MG capsule Take 1 capsule (300 mg total) by mouth 2 (two) times daily. 03/10/21   Tommie Samsook, Jayce G, DO  ?cetirizine (ZYRTEC) 10 MG tablet Take 10 mg by mouth daily.    [provider]  ?docusate sodium (COLACE) 100 MG capsule Take 1 capsule (100 mg total) by mouth 2 (two) times daily. 07/27/20   Charlett NoseMarinone, Michelle E, MD  ?ferrous sulfate 325 (65 FE) MG tablet Take 1 tablet (325 mg total) by mouth daily with breakfast. 07/27/20   Charlett NoseMarinone, Michelle E, MD  ?ibuprofen (ADVIL) 200 MG tablet Take 3 tablets (600 mg total) by mouth every 6 (six) hours as needed for cramping. 07/27/20   Charlett NoseMarinone, Michelle E, MD  ?naproxen (NAPROSYN) 500 MG tablet Take 1 tablet (500 mg total) by mouth 2 (two) times daily with a meal. 03/08/21   Wallis BambergMani, Mario, PA-C  ?predniSONE (DELTASONE)  50 MG tablet 1 tablet daily x 5 days 03/10/21   Tommie Sams, DO  ?Prenatal Vit-Fe Fumarate-FA (PRENATAL MULTIVITAMIN) TABS tablet Take 1 tablet by mouth daily at 12 noon.    [provider]  ?promethazine-dextromethorphan (PROMETHAZINE-DM) 6.25-15 MG/5ML syrup Take 5 mLs by mouth 4 (four) times daily as needed for cough. 03/08/21   Wallis Bamberg, PA-C  ? ? ?Family History ?Family History  ?Problem Relation Age of Onset  ? Hyperlipidemia Mother   ? Hypertension Mother   ? Obesity Mother   ? Alcohol abuse Father   ?     recurring  ? Obesity Brother   ? Mental illness Maternal Grandmother   ?     anxiety  ? Arthritis Maternal Grandfather   ? Cancer Maternal Grandfather   ?     colon, diagnosed in 25's  ? Diabetes Maternal Grandfather   ? Heart disease Paternal Grandfather   ? Cancer Paternal Grandfather   ?     lymphoma, ? lung CA- smoker  ? ? ?Social History ?Social History  ? ?Tobacco Use  ? Smoking  status: Never  ?  Passive exposure: Never  ? Smokeless tobacco: Never  ?Vaping Use  ? Vaping Use: Never used  ?Substance Use Topics  ? Alcohol use: Yes  ?  Comment: rarely  ? Drug use: No  ? ? ? ?Allergies   ?Lactose intolerance (gi) ? ? ?Review of Systems ?Review of Systems ?Per HPI ? ?Physical Exam ?Triage Vital Signs ?ED Triage Vitals [04/27/21 0844]  ?Enc Vitals Group  ?   BP 121/85  ?   Pulse Rate 92  ?   Resp 18  ?   Temp 98.3 ?F (36.8 ?C)  ?   Temp Source Oral  ?   SpO2 93 %  ?   Weight   ?   Height   ?   Head Circumference   ?   Peak Flow   ?   Pain Score 0  ?   Pain Loc   ?   Pain Edu?   ?   Excl. in GC?   ? ?No data found. ? ?Updated Vital Signs ?BP 121/85 (BP Location: Right Arm)   Pulse 92   Temp 98.3 ?F (36.8 ?C) (Oral)   Resp 18   SpO2 93%   Breastfeeding No  ? ?Visual Acuity ?Right Eye Distance:   ?Left Eye Distance:   ?Bilateral Distance:   ? ?Right Eye Near:   ?Left Eye Near:    ?Bilateral Near:    ? ?Physical Exam ?Vitals and nursing note reviewed.  ?Constitutional:   ?   Appearance: Normal appearance.  ?HENT:  ?   Head: Atraumatic.  ?   Right Ear: Tympanic membrane and external ear normal.  ?   Left Ear: Tympanic membrane and external ear normal.  ?   Nose: Rhinorrhea present.  ?   Mouth/Throat:  ?   Mouth: Mucous membranes are moist.  ?   Pharynx: Posterior oropharyngeal erythema present.  ?Eyes:  ?   Extraocular Movements: Extraocular movements intact.  ?   Conjunctiva/sclera: Conjunctivae normal.  ?Cardiovascular:  ?   Rate and Rhythm: Normal rate and regular rhythm.  ?   Heart sounds: Normal heart sounds.  ?Pulmonary:  ?   Effort: Pulmonary effort is normal.  ?   Breath sounds: Normal breath sounds. No wheezing or rales.  ?Musculoskeletal:     ?   General: Normal range of motion.  ?  Cervical back: Normal range of motion and neck supple.  ?Skin: ?   General: Skin is warm and dry.  ?Neurological:  ?   Mental Status: She is alert and oriented to person, place, and time.  ?Psychiatric:      ?   Mood and Affect: Mood normal.     ?   Thought Content: Thought content normal.  ? ? ? ?UC Treatments / Results  ?Labs ?(all labs ordered are listed, but only abnormal results are displayed) ?Labs Reviewed  ?COVID-19, FLU A+B NAA  ? ? ?EKG ? ? ?Radiology ?No results found. ? ?Procedures ?Procedures (including critical care time) ? ?Medications Ordered in UC ?Medications - No data to display ? ?Initial Impression / Assessment and Plan / UC Course  ?I have reviewed the triage vital signs and the nursing notes. ? ?Pertinent labs & imaging results that were available during my care of the patient were reviewed by me and considered in my medical decision making (see chart for details). ? ?  ? ?Vital signs reassuring, treat for viral upper respiratory infection/bronchitis with prednisone, Phenergan DM, supportive over-the-counter medications and home care.  Return for acutely worsening symptoms.  COVID and flu pending. ? ?Final Clinical Impressions(s) / UC Diagnoses  ? ?Final diagnoses:  ?Viral URI with cough  ? ?Discharge Instructions   ?None ?  ? ?ED Prescriptions   ? ? Medication Sig Dispense Auth. Provider  ? predniSONE (DELTASONE) 20 MG tablet Take 2 tablets (40 mg total) by mouth daily with breakfast. 10 tablet Particia Nearing, PA-C  ? promethazine-dextromethorphan (PROMETHAZINE-DM) 6.25-15 MG/5ML syrup Take 5 mLs by mouth 4 (four) times daily as needed. 100 mL Particia Nearing, [place]  ? ?  ? ?PDMP not reviewed this encounter. ?  ?Particia Nearing, PA-C ?04/27/21 1255 ? ?

## 2021-04-27 NOTE — ED Triage Notes (Signed)
Pt states she has had a cough since Wednesday ? ?Pt states it is harder to sleep because of cough and she has felt fevrish ? ?Pt states she has tried Aleve and Tylenol and Mucinex and cough syrup without any relief ? ?Pt states her voice is hoarse ?

## 2021-04-29 LAB — COVID-19, FLU A+B NAA
Influenza A, NAA: NOT DETECTED
Influenza B, NAA: NOT DETECTED
SARS-CoV-2, NAA: NOT DETECTED

## 2021-07-05 ENCOUNTER — Other Ambulatory Visit: Payer: Self-pay

## 2021-07-05 ENCOUNTER — Ambulatory Visit
Admission: EM | Admit: 2021-07-05 | Discharge: 2021-07-05 | Disposition: A | Discharge status: Home / Self Care | Payer: BC Managed Care – PPO | Attending: Emergency Medicine | Admitting: Emergency Medicine

## 2021-07-05 ENCOUNTER — Encounter: Payer: Self-pay | Admitting: Emergency Medicine

## 2021-07-05 DIAGNOSIS — R110 Nausea: Secondary | ICD-10-CM

## 2021-07-05 MED ORDER — ONDANSETRON 4 MG PO TBDP: 4.0000 mg | ORAL_TABLET | Freq: Once | ORAL | Status: DC

## 2021-07-05 MED ORDER — ONDANSETRON 4 MG PO TBDP: 4.0000 mg | ORAL_TABLET | Freq: Three times a day (TID) | ORAL | 0 refills | Status: DC | PRN

## 2021-07-05 NOTE — ED Provider Notes (Signed)
Haley Washington    CSN: [id number] Arrival date & time: 07/05/21  1232      History   Chief Complaint Chief Complaint  Patient presents with   Nausea    HPI Haley Washington is a 36 y.o. female.   Patient presents with nausea x 3 hours.  She also reports mild body aches.  No fever, chills, abdominal pain, vomiting, diarrhea, chest pain, cough, shortness of breath, or other symptoms.  No treatments at home.  Her husband and children have a stomach illness with various stages of vomiting and diarrhea.  The history is provided by the patient and medical records.    Past Medical History:  Diagnosis Date   Acne    has been on aldactone in the past.  Also tried Accutane without improvement in her symptoms    Allergy    History of chicken pox    Hx: UTI (urinary tract infection)    Hyperlipidemia    Low back pain 2017   improved with physical therapy   Morbid obesity with BMI of 40.0-44.9, adult [facility])     Patient Active Problem List   Diagnosis Date Noted   Strep pharyngitis 03/10/2021   Pedal edema 01/25/2018   Morbid obesity with BMI of 40.0-44.9, adult [facility])     Past Surgical History:  Procedure Laterality Date   ADENOIDECTOMY     DILATION AND EVACUATION N/A 10/27/2016   Procedure: DILATATION AND EVACUATION;  Surgeon: Waynard Reeds, MD;  Location: [facility] SU[facility];  Service: Gynecology;  Laterality: N/A;    OB History     Gravida  3   Para  2   Term  2   Preterm      AB  1   Living  2      SAB  1   IAB      Ectopic      Multiple  0   Live Births  2            Home Medications    Prior to Admission medications   Medication Sig Start Date End Date Taking? Authorizing Provider  ondansetron (ZOFRAN-ODT) 4 MG disintegrating tablet Take 1 tablet (4 mg total) by mouth every 8 (eight) hours as needed for nausea or vomiting. 07/05/21  Yes Mickie Bail, NP  acetaminophen (TYLENOL) 325 MG tablet Take 2 tablets (650 mg total) by mouth every 6  (six) hours as needed (for pain scale < 4). 07/27/20   [name], MD  cefdinir (OMNICEF) 300 MG capsule Take 1 capsule (300 mg total) by mouth 2 (two) times daily. Patient not taking: Reported on 07/05/2021 03/10/21   Tommie Sams, DO  cetirizine (ZYRTEC) 10 MG tablet Take 10 mg by mouth daily. Patient not taking: Reported on 07/05/2021    [provider]  docusate sodium (COLACE) 100 MG capsule Take 1 capsule (100 mg total) by mouth 2 (two) times daily. 07/27/20   [name], MD  ferrous sulfate 325 (65 FE) MG tablet Take 1 tablet (325 mg total) by mouth daily with breakfast. 07/27/20   [name], MD  ibuprofen (ADVIL) 200 MG tablet Take 3 tablets (600 mg total) by mouth every 6 (six) hours as needed for cramping. 07/27/20   [name], MD  naproxen (NAPROSYN) 500 MG tablet Take 1 tablet (500 mg total) by mouth 2 (two) times daily with a meal. Patient not taking: Reported on 07/05/2021 03/08/21   Wallis Bamberg, PA-C  predniSONE (DELTASONE) 20 MG tablet Take 2 tablets (40 mg total) by mouth daily with breakfast. Patient not taking: Reported on 07/05/2021 04/27/21   Particia Nearing, PA-C  predniSONE (DELTASONE) 50 MG tablet 1 tablet daily x 5 days 03/10/21   Tommie Sams, DO  Prenatal Vit-Fe Fumarate-FA (PRENATAL MULTIVITAMIN) TABS tablet Take 1 tablet by mouth daily at 12 noon.    [provider]  promethazine-dextromethorphan (PROMETHAZINE-DM) 6.25-15 MG/5ML syrup Take 5 mLs by mouth 4 (four) times daily as needed for cough. Patient not taking: Reported on 07/05/2021 03/08/21   Wallis Bamberg, PA-C  promethazine-dextromethorphan (PROMETHAZINE-DM) 6.25-15 MG/5ML syrup Take 5 mLs by mouth 4 (four) times daily as needed. Patient not taking: Reported on 07/05/2021 04/27/21   Particia Nearing, PA-C    Family History Family History  Problem Relation Age of Onset   Hyperlipidemia Mother    Hypertension Mother    Obesity Mother    Alcohol abuse  Father        recurring   Obesity Brother    Mental illness Maternal Grandmother        anxiety   Arthritis Maternal Grandfather    Cancer Maternal Grandfather        colon, diagnosed in 96's   Diabetes Maternal Grandfather    Heart disease Paternal Grandfather    Cancer Paternal Grandfather        lymphoma, ? lung CA- smoker    Social History Social History   Tobacco Use   Smoking status: Never    Passive exposure: Never   Smokeless tobacco: Never  Vaping Use   Vaping Use: Never used  Substance Use Topics   Alcohol use: Yes    Comment: rarely   Drug use: No     Allergies   Lactose intolerance (gi)   Review of Systems Review of Systems  Constitutional:  Negative for chills and fever.  HENT:  Negative for ear pain and sore throat.   Respiratory:  Negative for cough and shortness of breath.   Cardiovascular:  Negative for chest pain and palpitations.  Gastrointestinal:  Positive for nausea. Negative for abdominal pain, diarrhea and vomiting.  Skin:  Negative for color change and rash.  All other systems reviewed and are negative.    Physical Exam Triage Vital Signs ED Triage Vitals  Enc Vitals Group     BP      Pulse      Resp      Temp      Temp src      SpO2      Weight      Height      Head Circumference      Peak Flow      Pain Score      Pain Loc      Pain Edu?      Excl. in GC?    No data found.  Updated Vital Signs BP 125/83 (BP Location: Right Arm) Comment (BP Location): large cuff  Pulse 100   Temp 99.2 F (37.3 C) (Oral)   SpO2 97%   Visual Acuity Right Eye Distance:   Left Eye Distance:   Bilateral Distance:    Right Eye Near:   Left Eye Near:    Bilateral Near:     Physical Exam Vitals and nursing note reviewed.  Constitutional:      General: She is not in acute distress.    Appearance: She is well-developed. She is obese. She is not ill-appearing.  HENT:     Mouth/Throat:     Mouth: Mucous membranes are moist.      Pharynx: Oropharynx is clear.  Cardiovascular:     Rate and Rhythm: Normal rate and regular rhythm.     Heart sounds: Normal heart sounds.  Pulmonary:     Effort: Pulmonary effort is normal. No respiratory distress.     Breath sounds: Normal breath sounds.  Abdominal:     General: Bowel sounds are normal.     Palpations: Abdomen is soft.     Tenderness: There is no abdominal tenderness. There is no guarding or rebound.  Musculoskeletal:     Cervical back: Neck supple.  Skin:    General: Skin is warm and dry.  Neurological:     Mental Status: She is alert.  Psychiatric:        Mood and Affect: Mood normal.        Behavior: Behavior normal.      UC Treatments / Results  Labs (all labs ordered are listed, but only abnormal results are displayed) Labs Reviewed - No data to display  EKG   Radiology No results found.  Procedures Procedures (including critical care time)  Medications Ordered in UC Medications  ondansetron (ZOFRAN-ODT) disintegrating tablet 4 mg (4 mg Oral Not Given 07/05/21 1306)    Initial Impression / Assessment and Plan / UC Course  I have reviewed the triage vital signs and the nursing notes.  Pertinent labs & imaging results that were available during my care of the patient were reviewed by me and considered in my medical decision making (see chart for details).   Nausea without vomiting.  Patient was exposed to stomach illness from her children and husband.  She developed nausea a few hours ago.  No vomiting or diarrhea yet.  Treating nausea with Zofran.  Discussed clear liquid diet.  Education provided on nausea.  Instructed patient to follow up with her PCP if her symptoms are not improving.  She agrees to plan of care.     Final Clinical Impressions(s) / UC Diagnoses   Final diagnoses:  Nausea without vomiting     Discharge Instructions      Take the antinausea medication as directed.    Keep yourself hydrated with clear liquids, such  as water and Gatorade.    Go to the emergency department if you have acute worsening symptoms.    Follow up with your primary care provider if your symptoms are not improving.          ED Prescriptions     Medication Sig Dispense Auth. Provider   ondansetron (ZOFRAN-ODT) 4 MG disintegrating tablet Take 1 tablet (4 mg total) by mouth every 8 (eight) hours as needed for nausea or vomiting. 20 tablet Mickie Bail, NP      PDMP not reviewed this encounter.   Mickie Bail, NP 07/05/21 1318

## 2021-07-05 NOTE — ED Triage Notes (Signed)
3 hours ago started feeling queezy and nauseous.  Child have been vomiting  spousebeing seen as well with similar symptoms

## 2021-07-05 NOTE — Discharge Instructions (Addendum)
Take the antinausea medication as directed.   ° °Keep yourself hydrated with clear liquids, such as water and Gatorade.   ° °Go to the emergency department if you have acute worsening symptoms.   ° °Follow up with your primary care provider if your symptoms are not improving.   ° ° ° °

## 2021-10-28 ENCOUNTER — Ambulatory Visit: Payer: BC Managed Care – PPO | Admitting: Family Medicine

## 2021-10-28 ENCOUNTER — Encounter: Payer: Self-pay | Admitting: Family Medicine

## 2021-10-28 VITALS — BP 130/87 | HR 91 | Temp 97.8°F | Resp 16 | Ht 64.0 in | Wt 258.6 lb

## 2021-10-28 DIAGNOSIS — R059 Cough, unspecified: Secondary | ICD-10-CM

## 2021-10-28 DIAGNOSIS — Z7689 Persons encountering health services in other specified circumstances: Secondary | ICD-10-CM | POA: Diagnosis not present

## 2021-10-28 DIAGNOSIS — R0789 Other chest pain: Secondary | ICD-10-CM | POA: Insufficient documentation

## 2021-10-28 DIAGNOSIS — I83813 Varicose veins of bilateral lower extremities with pain: Secondary | ICD-10-CM

## 2021-10-28 MED ORDER — BENZONATATE 100 MG PO CAPS: 100.0000 mg | ORAL_CAPSULE | Freq: Two times a day (BID) | ORAL | 0 refills | Status: DC | PRN

## 2021-10-28 MED ORDER — MELOXICAM 7.5 MG PO TABS: 7.5000 mg | ORAL_TABLET | Freq: Every day | ORAL | 0 refills | Status: DC

## 2021-10-28 NOTE — Assessment & Plan Note (Signed)
Acute cough for 1 week  We discussed course of post-viral cough that could take up to 8 weeks to completely resolve Recommended warm teas with honey We will prescribe 100 mg twice daily as needed of Tessalon Perles Due to shortness of breath with activities, will send for chest x-ray in the event that patient may have atypical pneumonia

## 2021-10-28 NOTE — Assessment & Plan Note (Signed)
Prominent veins on left lower extremity visualized today Will refer to vein specialist

## 2021-10-28 NOTE — Patient Instructions (Addendum)
Please report to [facility] located at:  [place], [place]  You do not need an appointment to have xrays completed.   Our office will follow up with  results once available.    I have sent in a medication for inflammation to take once daily over the next four weeks.    Please follow up with me in 1 month.

## 2021-10-28 NOTE — Assessment & Plan Note (Signed)
Given the reproduction of pain with certain movements, highly suspicious that this is MSK pain of the inferior rib cage vs diaphragmatic irritation  Patients has G cup breast but would expect pain from this to affect her thoracic area more than the inframammary region  Will trial a few weeks of meloxicam 7/5mg  daily and have her follow up in 4 weeks

## 2021-10-28 NOTE — Assessment & Plan Note (Signed)
Patient presents visit to establish care with new primary care physician Introduced myself and my role as primary We reviewed patient's medical, surgical, medications and social history additional problems were discussed as detailed below

## 2021-10-28 NOTE — Progress Notes (Signed)
I,Haley Washington,acting as a scribe for [employer], MD.,have documented all relevant documentation on the behalf of Haley Foster, MD,as directed by  Haley Foster, MD while in the presence of Haley Foster, MD.   New patient visit   Patient: Haley Washington   DOB: Mar 26, 1985   36 y.o. Female  MRN: [id number] Visit Date: 10/28/2021  Today's healthcare provider: [name], MD   Chief Complaint  Patient presents with   Haley Washington is a 36 y.o. female who presents today as a new patient to establish care.    Reports feeling more SOB with regular activities  And winded more than usual  She reports feeling mild fevers and chills  She reports some lower back pain and feeling stiff  She reports decreased appetite   Cough This is a recurrent problem. The current episode started 1 to 4 weeks ago. The cough is Non-productive. Associated symptoms include eye redness and nasal congestion. Pertinent negatives include no ear congestion, ear pain, fever, headaches, postnasal drip or sore throat. Associated symptoms comments: congestion. The symptoms are aggravated by lying down. She has tried OTC cough suppressant (Mucinex, Advil Cold and Sinus) for the symptoms. The treatment provided no relief.     Varicose Veins  Patient reports having pain in her legs.Reports that she has varicose veins both legs but worse in left leg. Present since her pregnancies and now uncomfortable with certain pain if she wears particular pairs of pains. She shows prominent veins in her LE and states that she has the veins in her thighs as well.     Pain Beneath Breasts Pain occurs under bilateral breasts when she bends down.  This has been going on for the past year.  Reports that is a sharp pain and now it has become more frequently for the past few months.Pain usually occurs intermittently while  picking up objects   for 20-30 secs or specifically while trying to pick up her child  She relieves this by sitting upright and breathing slowly and the pain resolves  No longer wears bra with underwiring  She denies that her breasts themselves hurt  She is wearing a G cup now She reports hx of chronic lower back pain and denies that current pain episodes are related to her back    Past Medical History:  Diagnosis Date   Acne    has been on aldactone in the past.  Also tried Accutane without improvement in her symptoms    Allergy    History of chicken pox    Hx: UTI (urinary tract infection)    Hyperlipidemia    Low back pain 2017   improved with physical therapy   Morbid obesity with BMI of 40.0-44.9, adult [facility])    Past Surgical History:  Procedure Laterality Date   ADENOIDECTOMY     DILATION AND EVACUATION N/A 10/27/2016   Procedure: DILATATION AND EVACUATION;  Surgeon: Vanessa Kick, MD;  Location: [facility];  Service: Gynecology;  Laterality: N/A;   Family Status  Relation Name Status   Mother  Alive   Father  Alive   Brother  Alive   MGM  Alive   MGF  Alive   [place]  Deceased   PGF  Deceased   Family History  Problem Relation Age of Onset   Hyperlipidemia Mother    Hypertension Mother    Obesity Mother    Alcohol abuse Father  recurring   Obesity Brother    Mental illness Maternal Grandmother        anxiety   Arthritis Maternal Grandfather    Cancer Maternal Grandfather        colon, diagnosed in 28's   Diabetes Maternal Grandfather    Heart disease Paternal Grandfather    Cancer Paternal Grandfather        lymphoma, ? lung CA- smoker   Social History   Socioeconomic History   Marital status: Married    Spouse name: Valarie Merino   Number of children: Not on file   Years of education: Not on file   Highest education level: Not on file  Occupational History   Not on file  Tobacco Use   Smoking status: Never    Passive exposure: Never   Smokeless tobacco: Never   Vaping Use   Vaping Use: Never used  Substance and Sexual Activity   Alcohol use: Yes    Comment: rarely   Drug use: No   Sexual activity: Yes    Partners: Male    Birth control/protection: None    Comment: natural family planning (NFP)  Other Topics Concern   Not on file  Social History Narrative   Grew up in [place], moved here from [place]   Married    Masters degree   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   Social Determinants of Health   Financial Resource Strain: [place]  (09/10/2017)   Overall Financial Resource Strain (CARDIA)    Difficulty of Paying Living Expenses: Not hard at all  Food Insecurity: No Food Insecurity (09/10/2017)   Hunger Vital Sign    Worried About Running Out of Food in the Last Year: Never true    [place] in the Last Year: Never true  Transportation Needs: No Transportation Needs (09/10/2017)   PRAPARE - Hydrologist (Medical): No    Lack of Transportation (Non-Medical): No  Physical Activity: Inactive (09/10/2017)   Exercise Vital Sign    Days of Exercise per Week: 0 days    Minutes of Exercise per Session: 0 min  Stress: No Stress Concern Present (09/10/2017)   [place]    Feeling of Stress : Not at all  Social Connections: Not on file   Outpatient Medications Prior to Visit  Medication Sig   Prenatal Vit-Fe Fumarate-FA (PRENATAL MULTIVITAMIN) TABS tablet Take 1 tablet by mouth daily at 12 noon.   [DISCONTINUED] acetaminophen (TYLENOL) 325 MG tablet Take 2 tablets (650 mg total) by mouth every 6 (six) hours as needed (for pain scale < 4).   [DISCONTINUED] cefdinir (OMNICEF) 300 MG capsule Take 1 capsule (300 mg total) by mouth 2 (two) times daily. (Patient not taking: Reported on 07/05/2021)   [DISCONTINUED] cetirizine (ZYRTEC) 10 MG tablet Take 10 mg by mouth daily. (Patient not taking: Reported on 07/05/2021)    [DISCONTINUED] docusate sodium (COLACE) 100 MG capsule Take 1 capsule (100 mg total) by mouth 2 (two) times daily.   [DISCONTINUED] ferrous sulfate 325 (65 FE) MG tablet Take 1 tablet (325 mg total) by mouth daily with breakfast.   [DISCONTINUED] ibuprofen (ADVIL) 200 MG tablet Take 3 tablets (600 mg total) by mouth every 6 (six) hours as needed for cramping.   [DISCONTINUED] naproxen (NAPROSYN) 500 MG tablet Take 1 tablet (500 mg total) by mouth 2 (two) times daily with a meal. (Patient not taking: Reported  on 07/05/2021)   [DISCONTINUED] ondansetron (ZOFRAN-ODT) 4 MG disintegrating tablet Take 1 tablet (4 mg total) by mouth every 8 (eight) hours as needed for nausea or vomiting.   [DISCONTINUED] predniSONE (DELTASONE) 20 MG tablet Take 2 tablets (40 mg total) by mouth daily with breakfast. (Patient not taking: Reported on 07/05/2021)   [DISCONTINUED] predniSONE (DELTASONE) 50 MG tablet 1 tablet daily x 5 days   [DISCONTINUED] promethazine-dextromethorphan (PROMETHAZINE-DM) 6.25-15 MG/5ML syrup Take 5 mLs by mouth 4 (four) times daily as needed for cough. (Patient not taking: Reported on 07/05/2021)   [DISCONTINUED] promethazine-dextromethorphan (PROMETHAZINE-DM) 6.25-15 MG/5ML syrup Take 5 mLs by mouth 4 (four) times daily as needed. (Patient not taking: Reported on 07/05/2021)   No facility-administered medications prior to visit.   Allergies  Allergen Reactions   Lactose Intolerance (Gi) Other (See Comments)    GI upset    Immunization History  Administered Date(s) Administered   Influenza,inj,Quad PF,6+ Mos 10/19/2016, 09/29/2017   Influenza-Unspecified 10/20/2018, 11/16/2019   MMR 12/02/2017   PFIZER(Purple Top)SARS-COV-2 Vaccination 03/23/2019, 04/20/2019, 10/20/2019   Tdap 12/25/2016, 06/03/2020    Health Maintenance  Topic Date Due   Hepatitis C Screening  Never done   PAP SMEAR-Modifier  07/30/2019   COVID-19 Vaccine (4 - Pfizer series) 12/15/2019   INFLUENZA VACCINE   08/19/2021   TETANUS/TDAP  06/04/2030   HIV Screening  Completed   HPV VACCINES  Aged Out    Patient Care Team: Haley Foster, MD as PCP - General (Family Medicine) Bobbye Charleston, MD as Consulting Physician (Obstetrics and Gynecology)  Review of Systems  Constitutional:  Positive for fatigue. Negative for fever.  HENT:  Negative for ear pain, postnasal drip and sore throat.   Eyes:  Positive for redness.  Respiratory:  Positive for cough.   Neurological:  Negative for headaches.  All other systems reviewed and are negative.      Objective    BP 130/87 (BP Location: Right Arm, Patient Position: Sitting, Cuff Size: Large)   Pulse 91   Temp 97.8 F (36.6 C) (Oral)   Resp 16   Ht '5\' 4"'  (1.626 m)   Wt 258 lb 9.6 oz (117.3 kg)   BMI 44.39 kg/m    Physical Exam Vitals reviewed.  Constitutional:      General: She is not in acute distress.    Appearance: Normal appearance. She is not ill-appearing, toxic-appearing or diaphoretic.  Eyes:     Conjunctiva/sclera: Conjunctivae normal.  Cardiovascular:     Rate and Rhythm: Normal rate and regular rhythm.     Pulses: Normal pulses.     Heart sounds: Normal heart sounds. No murmur heard.    No friction rub. No gallop.  Pulmonary:     Effort: Pulmonary effort is normal. No respiratory distress.     Breath sounds: Normal breath sounds. No stridor or decreased air movement. No decreased breath sounds, wheezing, rhonchi or rales.  Chest:     Chest wall: No crepitus.  Neurological:     Mental Status: She is alert and oriented to person, place, and time.      Depression Screen    10/28/2021    2:08 PM 03/10/2021    2:59 PM 11/30/2017    8:12 AM 12/25/2016    4:10 PM  PHQ 2/9 Scores  PHQ - 2 Score 0 0 0 0  PHQ- 9 Score 2   1   No results found for any visits on 10/28/21.  Assessment & Plan  Problem List Items Addressed This Visit       Cardiovascular and Mediastinum   Varicose veins of bilateral  lower extremities with pain    Prominent veins on left lower extremity visualized today Will refer to vein specialist       Relevant Orders   Ambulatory referral to Vascular Surgery     Other   Cough in adult - Primary    Acute cough for 1 week  We discussed course of post-viral cough that could take up to 8 weeks to completely resolve Recommended warm teas with honey We will prescribe 100 mg twice daily as needed of Tessalon Perles Due to shortness of breath with activities, will send for chest x-ray in the event that patient may have atypical pneumonia       Relevant Orders   DG Chest 2 View   Encounter to establish care    Patient presents visit to establish care with new primary care physician Introduced myself and my role as primary We reviewed patient's medical, surgical, medications and social history additional problems were discussed as detailed below       Musculoskeletal chest pain    Given the reproduction of pain with certain movements, highly suspicious that this is MSK pain of the inferior rib cage vs diaphragmatic irritation  Patients has G cup breast but would expect pain from this to affect her thoracic area more than the inframammary region  Will trial a few weeks of meloxicam 7/1m daily and have her follow up in 4 weeks          Return in about 1 month (around 11/28/2021) for MSK chest discomfort.    I, MEulis Foster MD, have reviewed all documentation for this visit. The documentation on 10/28/21 for the exam, diagnosis, procedures, and orders are all accurate and complete.   MEulis Foster MD  [phone](phone) [phone](fax)  [place]

## 2021-10-29 ENCOUNTER — Ambulatory Visit
Admission: RE | Admit: 2021-10-29 | Discharge: 2021-10-29 | Disposition: A | Discharge status: Home / Self Care | Payer: BC Managed Care – PPO | Attending: Family Medicine | Admitting: Family Medicine

## 2021-10-29 ENCOUNTER — Ambulatory Visit
Admission: RE | Admit: 2021-10-29 | Discharge: 2021-10-29 | Disposition: A | Discharge status: Home / Self Care | Payer: BC Managed Care – PPO | Source: Ambulatory Visit | Attending: Family Medicine | Admitting: Family Medicine

## 2021-10-29 DIAGNOSIS — R059 Cough, unspecified: Secondary | ICD-10-CM | POA: Insufficient documentation

## 2021-10-29 DIAGNOSIS — R0602 Shortness of breath: Secondary | ICD-10-CM | POA: Diagnosis not present

## 2021-10-30 ENCOUNTER — Encounter: Payer: Self-pay | Admitting: Family Medicine

## 2021-10-30 ENCOUNTER — Other Ambulatory Visit: Payer: Self-pay | Admitting: Family Medicine

## 2021-10-30 DIAGNOSIS — J189 Pneumonia, unspecified organism: Secondary | ICD-10-CM

## 2021-10-30 MED ORDER — AMOXICILLIN 500 MG PO CAPS: 1000.0000 mg | ORAL_CAPSULE | Freq: Three times a day (TID) | ORAL | 0 refills | Status: AC

## 2021-10-30 MED ORDER — AZITHROMYCIN 500 MG PO TABS: 500.0000 mg | ORAL_TABLET | Freq: Every day | ORAL | 0 refills | Status: AC

## 2021-11-17 ENCOUNTER — Ambulatory Visit: Payer: BC Managed Care – PPO | Admitting: Family Medicine

## 2021-11-19 ENCOUNTER — Ambulatory Visit: Payer: BC Managed Care – PPO | Admitting: Family Medicine

## 2021-11-19 ENCOUNTER — Encounter: Payer: Self-pay | Admitting: Family Medicine

## 2021-11-19 VITALS — BP 124/89 | HR 73 | Resp 16 | Wt 263.0 lb

## 2021-11-19 DIAGNOSIS — R0789 Other chest pain: Secondary | ICD-10-CM | POA: Diagnosis not present

## 2021-11-19 DIAGNOSIS — R059 Cough, unspecified: Secondary | ICD-10-CM | POA: Diagnosis not present

## 2021-11-19 NOTE — Patient Instructions (Addendum)
I have ordered a mammogram. Please call the number below to schedule an appointment and plan to follow up with me after your scan   [facility] at [facility] [place],  [place]  [id number] Get Driving Directions Main: [phone]

## 2021-11-19 NOTE — Progress Notes (Signed)
I,Haley Washington,acting as a scribe for [employer], MD.,have documented all relevant documentation on the behalf of [name], MD,as directed by  [name], MD while in the presence of [name], MD.   Established patient visit   Patient: Haley Washington   DOB: 13-Jul-1985   36 y.o. Female  MRN: [id number] Visit Date: 11/19/2021  Today's healthcare provider: [name], MD   Chief Complaint  Patient presents with   Follow-Up pneumonia   Subjective    HPI  Pneumonia, f/u:  Patient was treated withAzithromycin 500 mg once daily for 3 days and amoxicillin 1 g 3 times daily for 5 days and is here for follow-up. Current symptoms include  mild cough and a little chest tightness .  She denies constant cough, cough after eating, difficulty breathing, dry cough, frequent throat clearing, post nasal drip, and shortness of breath.   Medications: Outpatient Medications Prior to Visit  Medication Sig   meloxicam (MOBIC) 7.5 MG tablet Take 1 tablet (7.5 mg total) by mouth daily.   benzonatate (TESSALON) 100 MG capsule Take 1 capsule (100 mg total) by mouth 2 (two) times daily as needed for cough.   Prenatal Vit-Fe Fumarate-FA (PRENATAL MULTIVITAMIN) TABS tablet Take 1 tablet by mouth daily at 12 noon.   No facility-administered medications prior to visit.    Review of Systems     Objective    BP 124/89 (BP Location: Left Arm, Patient Position: Sitting, Cuff Size: Large)   Pulse 73   Resp 16   Wt 263 lb (119.3 kg)   SpO2 99%   BMI 45.14 kg/m    Physical Exam Vitals reviewed.  Constitutional:      General: She is not in acute distress.    Appearance: Normal appearance. She is not ill-appearing, toxic-appearing or diaphoretic.  Cardiovascular:     Rate and Rhythm: Normal rate and regular rhythm.     Pulses: Normal pulses.     Heart sounds: Normal heart sounds. No murmur heard. Pulmonary:     Effort:  Pulmonary effort is normal. No respiratory distress.     Breath sounds: Wheezing present. No rhonchi or rales.     Comments: Occasional wheeze mostly in lower lobes  Do not appreciate any crackles   Chest:     Chest wall: Tenderness present. No mass, lacerations, deformity, swelling, crepitus or edema.  Breasts:    Tanner Score is 5.     Breasts are symmetrical.     Right: Normal. No swelling, bleeding, inverted nipple, nipple discharge, skin change or tenderness.     Left: Normal. No swelling, bleeding, inverted nipple, nipple discharge, skin change or tenderness.  Abdominal:     General: There is no distension.     Palpations: Abdomen is soft.  Lymphadenopathy:     Upper Body:     Right upper body: No supraclavicular, axillary or pectoral adenopathy.     Left upper body: No supraclavicular, axillary or pectoral adenopathy.  Neurological:     Mental Status: She is alert.       No results found for any visits on 11/19/21.  Assessment & Plan     Problem List Items Addressed This Visit       Other   Cough in adult    She has improved since being diagnosed with PNA  Questions answered with recommendations given        Chest wall discomfort - Primary    Discomfort is not constant At  this point has become chronic  It makes breathing uncomfortable when it happens and then resolves when she is bending over to pick something  Pain did not seem to respond to Meloxicam  Will order mammogram        No follow-ups on file.     I, [name], MD, have reviewed all documentation for this visit. The documentation on 11/21/21 for the exam, diagnosis, procedures, and orders are all accurate and complete.  Portions of this information were initially documented by the CMA and reviewed by me for thoroughness and accuracy.      [name], MD  [facility] [phone] (phone) [phone] (fax)  [place]

## 2021-11-20 ENCOUNTER — Telehealth: Payer: Self-pay

## 2021-11-20 ENCOUNTER — Other Ambulatory Visit: Payer: Self-pay | Admitting: Family Medicine

## 2021-11-20 DIAGNOSIS — N644 Mastodynia: Secondary | ICD-10-CM

## 2021-11-20 DIAGNOSIS — R0789 Other chest pain: Secondary | ICD-10-CM

## 2021-11-20 NOTE — Telephone Encounter (Signed)
Ordered placed

## 2021-11-20 NOTE — Telephone Encounter (Signed)
Copied from [place] [id number] [id number]. Topic: General - Other >> Nov 19, 2021  5:00 PM Haley Washington wrote: Reason for CRM: The patient has called to request orders for a diagnostic mammogram   Please contact further when possible

## 2021-11-21 NOTE — Assessment & Plan Note (Signed)
Discomfort is not constant At this point has become chronic  It makes breathing uncomfortable when it happens and then resolves when she is bending over to pick something  Pain did not seem to respond to Meloxicam  Will order mammogram

## 2021-11-21 NOTE — Assessment & Plan Note (Signed)
She has improved since being diagnosed with PNA  Questions answered with recommendations given

## 2021-11-25 ENCOUNTER — Encounter: Payer: Self-pay | Admitting: Family Medicine

## 2021-12-01 ENCOUNTER — Ambulatory Visit: Payer: BC Managed Care – PPO | Admitting: Family Medicine

## 2021-12-04 ENCOUNTER — Ambulatory Visit: Payer: Self-pay

## 2021-12-04 NOTE — Telephone Encounter (Signed)
  Chief Complaint: Back pain Symptoms: pain in lower back Frequency: 3 days Pertinent Negatives: Patient denies fever, neurological issues Disposition: [] ED /[] Urgent Care (no appt availability in office) / [x] Appointment(In office/virtual)/ []  [facility]/ [] Home Care/ [] Refused Recommended Disposition /[] [facility]/ []  Follow-up with PCP Additional Notes: Py has been using her back a lot in the past 3 days. 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 Pt states pain is 9/10 Pt has tried aleve and Tylenol with little relief. PT states it feels better when she sits down. Pt has had similar back apin in the past.

## 2021-12-04 NOTE — Telephone Encounter (Signed)
Reason for Disposition  [1] MODERATE back pain (e.g., interferes with normal activities) AND [2] present > 3 days  Answer Assessment - Initial Assessment Questions 1. ONSET: "When did the pain begin?"      2 days 2. LOCATION: "Where does it hurt?" (upper, mid or lower back)     Lower back - right in the center 3. SEVERITY: "How bad is the pain?"  (e.g., Scale 1-10; mild, moderate, or severe)   - MILD (1-3): Doesn't interfere with normal activities.    - MODERATE (4-7): Interferes with normal activities or awakens from sleep.    - SEVERE (8-10): Excruciating pain, unable to do any normal activities.      9/10 4. PATTERN: "Is the pain constant?" (e.g., yes, no; constant, intermittent)      constant 5. RADIATION: "Does the pain shoot into your legs or somewhere else?"     Down both legs 6. CAUSE:  "What do you think is causing the back pain?"      Overuse 7. BACK OVERUSE:  "Any recent lifting of heavy objects, strenuous work or exercise?"     Moving things, picking up  8. MEDICINES: "What have you taken so far for the pain?" (e.g., nothing, acetaminophen, NSAIDS)      Aleve, Tylenol 9. NEUROLOGIC SYMPTOMS: "Do you have any weakness, numbness, or problems with bowel/bladder control?"    dizziness 10. OTHER SYMPTOMS: "Do you have any other symptoms?" (e.g., fever, abdomen pain, burning with urination, blood in urine)       Dizziness 11. PREGNANCY: "Is there any chance you are pregnant?" "When was your last menstrual period?"       no  Protocols used: Back Pain-A-AH

## 2021-12-05 ENCOUNTER — Ambulatory Visit: Payer: BC Managed Care – PPO | Admitting: Physician Assistant

## 2021-12-05 ENCOUNTER — Encounter: Payer: Self-pay | Admitting: Physician Assistant

## 2021-12-05 VITALS — BP 131/74 | HR 74 | Wt 267.2 lb

## 2021-12-05 DIAGNOSIS — M62830 Muscle spasm of back: Secondary | ICD-10-CM

## 2021-12-05 MED ORDER — CYCLOBENZAPRINE HCL 5 MG PO TABS: 5.0000 mg | ORAL_TABLET | Freq: Three times a day (TID) | ORAL | 0 refills | Status: AC | PRN

## 2021-12-05 MED ORDER — CELECOXIB 100 MG PO CAPS: 100.0000 mg | ORAL_CAPSULE | Freq: Two times a day (BID) | ORAL | 0 refills | Status: AC

## 2021-12-05 NOTE — Progress Notes (Signed)
     I,Haley Washington,acting as a Neurosurgeon for [employer], PA-C.,have documented all relevant documentation on the behalf of Haley Ferguson, PA-C,as directed by  Haley Ferguson, PA-C while in the presence of Haley Ferguson, PA-C.   Established patient visit   Patient: Haley Washington   DOB: 1985/11/28   36 y.o. Female  MRN: [id number] Visit Date: 12/05/2021  Today's healthcare provider: [name], PA-C   Cc. Low back pain  Subjective     Kenslei is a 36 y/o female who presents today with midline low back pain x 4 days. She has tried tylenol, alleve for her symptoms. She has been taking mobic 7.5 mg daily for unrelated chest wall pain. She reports symptoms are worsen with twisting, bending, standing. Stiffness is present. Pain is rated at a 9/10 at worst. Denies injury, numbness, significant radiation of pain, paresthesias, change in BM or bladder habits.  Medications: Outpatient Medications Prior to Visit  Medication Sig   Azelastine-Fluticasone 137-50 MCG/ACT SUSP 1 spray in each nostril Nasally Twice a day for 30 days   levonorgestrel (MIRENA, 52 MG,) 20 MCG/DAY IUD Device provided by care center   montelukast (SINGULAIR) 10 MG tablet 1 tablet Orally Once a day for 30 days   [DISCONTINUED] meloxicam (MOBIC) 7.5 MG tablet Take 1 tablet (7.5 mg total) by mouth daily.   EPINEPHrine 0.3 mg/0.3 mL IJ SOAJ injection Inject into the muscle. (Patient not taking: Reported on 12/05/2021)   No facility-administered medications prior to visit.    Review of Systems  Musculoskeletal:  Positive for back pain.  Neurological:  Positive for numbness.      Objective    Blood pressure 131/74, pulse 74, weight 267 lb 3.2 oz (121.2 kg), SpO2 100 %, not currently breastfeeding.   Physical Exam Vitals reviewed.  Constitutional:      Appearance: She is not ill-appearing.  HENT:     Head: Normocephalic.  Eyes:     Conjunctiva/sclera: Conjunctivae normal.  Cardiovascular:     Rate and  Rhythm: Normal rate.  Pulmonary:     Effort: Pulmonary effort is normal. No respiratory distress.  Musculoskeletal:     Comments: No edema, erythema, rashes visible. L leg straight raise positive, R negative.  Neurological:     General: No focal deficit present.     Mental Status: She is alert and oriented to person, place, and time.  Psychiatric:        Mood and Affect: Mood normal.        Behavior: Behavior normal.     No results found for any visits on 12/05/21.  Assessment & Plan     Low back strain Advised ice/ heat, movement. Rx cyclobenzaprine 5 mg take 1-2 tablets QHS, up to TID. Advised sedating effects. Advised pt finish out mobic, and instead of adding alleve PRN, rx celebrex 100 mg BID PRN.  If no improvement f/b at office  Return if symptoms worsen or fail to improve.      I, Haley Ferguson, PA-C have reviewed all documentation for this visit. The documentation on  12/05/2021 for the exam, diagnosis, procedures, and orders are all accurate and complete.  Haley Ferguson, PA-C [facility] [address] [place], [place], [address] Office: [phone] Fax: [phone]   [facility]

## 2021-12-10 ENCOUNTER — Ambulatory Visit
Admission: RE | Admit: 2021-12-10 | Discharge: 2021-12-10 | Disposition: A | Discharge status: Home / Self Care | Payer: BC Managed Care – PPO | Source: Ambulatory Visit | Attending: Family Medicine | Admitting: Family Medicine

## 2021-12-10 ENCOUNTER — Ambulatory Visit: Payer: BC Managed Care – PPO | Admitting: Family Medicine

## 2021-12-10 DIAGNOSIS — N644 Mastodynia: Secondary | ICD-10-CM

## 2021-12-10 DIAGNOSIS — R0789 Other chest pain: Secondary | ICD-10-CM | POA: Insufficient documentation

## 2021-12-18 ENCOUNTER — Encounter (INDEPENDENT_AMBULATORY_CARE_PROVIDER_SITE_OTHER): Payer: Self-pay | Admitting: Vascular Surgery

## 2021-12-23 ENCOUNTER — Encounter: Payer: Self-pay | Admitting: Family Medicine

## 2021-12-23 ENCOUNTER — Ambulatory Visit: Payer: BC Managed Care – PPO | Admitting: Family Medicine

## 2021-12-23 VITALS — BP 127/78 | HR 81 | Temp 98.0°F | Resp 16 | Wt 267.1 lb

## 2021-12-23 DIAGNOSIS — R0789 Other chest pain: Secondary | ICD-10-CM

## 2021-12-23 DIAGNOSIS — M722 Plantar fascial fibromatosis: Secondary | ICD-10-CM | POA: Insufficient documentation

## 2021-12-23 DIAGNOSIS — Z6841 Body Mass Index (BMI) 40.0 and over, adult: Secondary | ICD-10-CM

## 2021-12-23 DIAGNOSIS — Z713 Dietary counseling and surveillance: Secondary | ICD-10-CM | POA: Diagnosis not present

## 2021-12-23 MED ORDER — SEMAGLUTIDE-WEIGHT MANAGEMENT 0.25 MG/0.5ML ~~LOC~~ SOAJ: 0.2500 mg | SUBCUTANEOUS | 0 refills | Status: DC

## 2021-12-23 NOTE — Patient Instructions (Addendum)
I have prescribed the starting dose of Wegovy to help with weight management.   Please inject 0.25mg  once weekly once you are able to obtain the prescription.   Please follow up in 1 month for your weight management and to check on the foot.   We will notify you of any abnormal results on your blood work today.   Plantar Fasciitis  Plantar fasciitis is a painful foot condition that affects the heel. It occurs when the band of tissue that connects the toes to the heel bone (plantar fascia) becomes irritated. This can happen as the result of exercising too much or doing other repetitive activities (overuse injury). Plantar fasciitis can cause mild irritation to severe pain that makes it difficult to walk or move. The pain is usually worse in the morning after sleeping, or after sitting or lying down for a period of time. Pain may also be worse after long periods of walking or standing. What are the causes? This condition may be caused by: Standing for long periods of time. Wearing shoes that do not have good arch support. Doing activities that put stress on joints (high-impact activities). This includes ballet and exercise that makes your heart beat faster (aerobic exercise), such as running. Being overweight. An abnormal way of walking (gait). Tight muscles in the back of your lower leg (calf). High arches in your feet or flat feet. Starting a new athletic activity. What are the signs or symptoms? The main symptom of this condition is heel pain. Pain may get worse after the following: Taking the first steps after a time of rest, especially in the morning after awakening, or after you have been sitting or lying down for a while. Long periods of standing still. Pain may decrease after 30-45 minutes of activity, such as gentle walking. How is this diagnosed? This condition may be diagnosed based on your medical history, a physical exam, and your symptoms. Your health care provider will  check for: A tender area on the bottom of your foot. A high arch in your foot or flat feet. Pain when you move your foot. Difficulty moving your foot. You may have imaging tests to confirm the diagnosis, such as: X-rays. Ultrasound. MRI. How is this treated? Treatment for plantar fasciitis depends on how severe your condition is. Treatment may include: Rest, ice, pressure (compression), and raising (elevating) the affected foot. This is called RICE therapy. Your health care provider may recommend RICE therapy along with over-the-counter pain medicines to manage your pain. Exercises to stretch your calves and your plantar fascia. A splint that holds your foot in a stretched, upward position while you sleep (night splint). Physical therapy to relieve symptoms and prevent problems in the future. Injections of steroid medicine (cortisone) to relieve pain and inflammation. Stimulating your plantar fascia with electrical impulses (extracorporeal shock wave therapy). This is usually the last treatment option before surgery. Surgery, if other treatments have not worked after 12 months. Follow these instructions at home: Managing pain, stiffness, and swelling  If directed, put ice on the painful area. To do this: Put ice in a plastic bag, or use a frozen bottle of water. Place a towel between your skin and the bag or bottle. Roll the bottom of your foot over the bag or bottle. Do this for 20 minutes, 2-3 times a day. Wear athletic shoes that have air-sole or gel-sole cushions, or try soft shoe inserts that are designed for plantar fasciitis. Elevate your foot above the level of your  heart while you are sitting or lying down. Activity Avoid activities that cause pain. Ask your health care provider what activities are safe for you. Do physical therapy exercises and stretches as told by your health care provider. Try activities and forms of exercise that are easier on your joints (low impact).  Examples include swimming, water aerobics, and biking. General instructions Take over-the-counter and prescription medicines only as told by your health care provider. Wear a night splint while sleeping, if told by your health care provider. Loosen the splint if your toes tingle, become numb, or turn cold and blue. Maintain a healthy weight, or work with your health care provider to lose weight as needed. Keep all follow-up visits. This is important. Contact a health care provider if you have: Symptoms that do not go away with home treatment. Pain that gets worse. Pain that affects your ability to move or do daily activities. Summary Plantar fasciitis is a painful foot condition that affects the heel. It occurs when the band of tissue that connects the toes to the heel bone (plantar fascia) becomes irritated. Heel pain is the main symptom of this condition. It may get worse after exercising too much or standing still for a long time. Treatment varies, but it usually starts with rest, ice, pressure (compression), and raising (elevating) the affected foot. This is called RICE therapy. Over-the-counter medicines can also be used to manage pain. This information is not intended to replace advice given to you by your health care provider. Make sure you discuss any questions you have with your health care provider. Document Revised: 04/24/2019 Document Reviewed: 04/24/2019 Elsevier Patient Education  [place].

## 2021-12-23 NOTE — Progress Notes (Signed)
I,Haley Washington,acting as a scribe for [employer], MD.,have documented all relevant documentation on the behalf of Haley Foster, MD,as directed by  Haley Foster, MD while in the presence of Haley Foster, MD.   Established patient visit   Patient: Haley Washington   DOB: December 12, 1985   36 y.o. Female  MRN: [id number] Visit Date: 12/23/2021  Today's healthcare provider: [name], MD   Chief Complaint  Patient presents with   Follow-up   Subjective    HPI  Follow up for chest wall discomfort  The patient was last seen for this 1 months ago. Changes made at last visit include mammogram ordered.Mammogram was normal.  Improved and no more episodes since imaging    Foot pain  Has been present since 2-3 months ago  First few steps after lying in bed  Wearing house shoes and then started to have pain in those  She started wearing new brand of shoes oofoos and had relief  Also removed several pairs of non-supported shoes    Obesity  Patient would like to start medication for weight managmenet  She is interested in started wegovy  She states her insurance plan would require for prior authorization  She has tried being vegetarian for several years and states that she was not choosing healthy vegetarian foods, she does not like salads  She states having a hard time to get children to eat vegetables and so meals are focused on their preferences  She has a hard time getting to the gym with 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 to formally work out  Last worked out over a year ago  She states she has not been able to lose a significant amount of weight per week  She reports increasing pain with back, foot and knee pain in setting of weight  She reports thiese MSK issues make her afraid to hurt  her back or knees    Health updates : started allergy injections today for trees and weeds    Back Pain  Improved  with activity   Medications: Outpatient Medications Prior to Visit  Medication Sig   Azelastine-Fluticasone 137-50 MCG/ACT SUSP 1 spray in each nostril Nasally Twice a day for 30 days   levonorgestrel (MIRENA, 52 MG,) 20 MCG/DAY IUD Device provided by care center   montelukast (SINGULAIR) 10 MG tablet 1 tablet Orally Once a day for 30 days   EPINEPHrine 0.3 mg/0.3 mL IJ SOAJ injection Inject into the muscle. (Patient not taking: Reported on 12/05/2021)   No facility-administered medications prior to visit.    Review of Systems     Objective    BP 127/78 (BP Location: Left Arm, Patient Position: Sitting, Cuff Size: Large)   Pulse 81   Temp 98 F (36.7 C) (Oral)   Resp 16   Wt 267 lb 1.6 oz (121.2 kg)   BMI 45.85 kg/m    Physical Exam Vitals reviewed.  Constitutional:      General: She is not in acute distress.    Appearance: Normal appearance. She is obese. She is not ill-appearing, toxic-appearing or diaphoretic.  Eyes:     Conjunctiva/sclera: Conjunctivae normal.  Cardiovascular:     Rate and Rhythm: Normal rate and regular rhythm.     Pulses: Normal pulses.     Heart sounds: Normal heart sounds. No murmur heard.    No friction rub. No gallop.  Pulmonary:     Effort: Pulmonary effort is normal. No respiratory distress.  Breath sounds: Normal breath sounds. No stridor. No wheezing, rhonchi or rales.  Abdominal:     General: Bowel sounds are normal. There is no distension.     Palpations: Abdomen is soft.     Tenderness: There is no abdominal tenderness.  Musculoskeletal:     Right lower leg: No edema.     Left lower leg: No edema.  Skin:    Findings: No erythema or rash.  Neurological:     Mental Status: She is alert and oriented to person, place, and time.      Results for orders placed or performed in visit on 12/23/21  Hemoglobin A1c  Result Value Ref Range   Hgb A1c MFr Bld 5.7 (H) 4.8 - 5.6 %   Est. average glucose Bld gHb Est-mCnc 117 mg/dL   Comprehensive metabolic panel  Result Value Ref Range   Glucose 109 (H) 70 - 99 mg/dL   BUN 16 6 - 20 mg/dL   Creatinine, Ser 0.81 0.57 - 1.00 mg/dL   eGFR 96 >59 mL/min/1.73   BUN/Creatinine Ratio 20 9 - 23   Sodium 139 134 - 144 mmol/L   Potassium 4.5 3.5 - 5.2 mmol/L   Chloride 103 96 - 106 mmol/L   CO2 23 20 - 29 mmol/L   Calcium 9.6 8.7 - 10.2 mg/dL   Total Protein 7.2 6.0 - 8.5 g/dL   Albumin 4.5 3.9 - 4.9 g/dL   Globulin, Total 2.7 1.5 - 4.5 g/dL   Albumin/Globulin Ratio 1.7 1.2 - 2.2   Bilirubin Total 0.2 0.0 - 1.2 mg/dL   Alkaline Phosphatase 76 44 - 121 IU/L   AST 21 0 - 40 IU/L   ALT 28 0 - 32 IU/L  TSH + free T4  Result Value Ref Range   TSH 1.220 0.450 - 4.500 uIU/mL   Free T4 1.04 0.82 - 1.77 ng/dL  CBC  Result Value Ref Range   WBC 6.1 3.4 - 10.8 x10E3/uL   RBC 4.47 3.77 - 5.28 x10E6/uL   Hemoglobin 13.3 11.1 - 15.9 g/dL   Hematocrit 40.7 34.0 - 46.6 %   MCV 91 79 - 97 fL   MCH 29.8 26.6 - 33.0 pg   MCHC 32.7 31.5 - 35.7 g/dL   RDW 12.3 11.7 - 15.4 %   Platelets 245 150 - 450 x10E3/uL    Assessment & Plan     Problem List Items Addressed This Visit       Musculoskeletal and Integument   Plantar fasciitis, bilateral - Primary    Exam and history consistent with plantars fasciitis Recommended that patient use tennis ball stretches or frozen water bottle stretches Patient given handout, see AVS         Other   Chest wall discomfort    Chronic, improved Decreased frequency of painful episodes She has had normal chest x-ray and normal mammogram Suspect this may be related to breast hypertrophy in setting of obesity with BMI of 46 Will attempt to help with weight management Wegovy as stated above       Morbid obesity with BMI of 45.0-49.9, adult (HCC)    We will collect hemoglobin A1c, CMP, CBC and TSH with free T4 Will start Wegovy to assist with weight loss Follow-up in 1 month      Relevant Orders   Hemoglobin A1c (Completed)    Comprehensive metabolic panel (Completed)   TSH + free T4 (Completed)   CBC (Completed)   Weight loss counseling, encounter for  Patient with elevated BMI qualifying for morbid obesity, 46.24 We will start [facility] panel at 0.25 mg weekly We will collect CMP, TSH and free T4 as well as A1c and CBC       Relevant Orders   Comprehensive metabolic panel (Completed)     Return in about 1 month (around 01/23/2022) for weight loss managment .      I, Haley Foster, MD, have reviewed all documentation for this visit.  Portions of this information were initially documented by the CMA and reviewed by me for thoroughness and accuracy.      Haley Foster, MD  [facility] [phone] (phone) [phone] (fax)  [place]

## 2021-12-24 ENCOUNTER — Other Ambulatory Visit: Payer: Self-pay | Admitting: Family Medicine

## 2021-12-24 ENCOUNTER — Other Ambulatory Visit: Payer: Self-pay

## 2021-12-24 ENCOUNTER — Telehealth: Payer: Self-pay | Admitting: Family Medicine

## 2021-12-24 ENCOUNTER — Encounter: Payer: Self-pay | Admitting: Family Medicine

## 2021-12-24 DIAGNOSIS — Z713 Dietary counseling and surveillance: Secondary | ICD-10-CM

## 2021-12-24 LAB — COMPREHENSIVE METABOLIC PANEL
ALT: 28 IU/L (ref 0–32)
AST: 21 IU/L (ref 0–40)
Albumin/Globulin Ratio: 1.7 (ref 1.2–2.2)
Albumin: 4.5 g/dL (ref 3.9–4.9)
Alkaline Phosphatase: 76 IU/L (ref 44–121)
BUN/Creatinine Ratio: 20 (ref 9–23)
BUN: 16 mg/dL (ref 6–20)
Bilirubin Total: 0.2 mg/dL (ref 0.0–1.2)
CO2: 23 mmol/L (ref 20–29)
Calcium: 9.6 mg/dL (ref 8.7–10.2)
Chloride: 103 mmol/L (ref 96–106)
Creatinine, Ser: 0.81 mg/dL (ref 0.57–1.00)
Globulin, Total: 2.7 g/dL (ref 1.5–4.5)
Glucose: 109 mg/dL — ABNORMAL HIGH (ref 70–99)
Potassium: 4.5 mmol/L (ref 3.5–5.2)
Sodium: 139 mmol/L (ref 134–144)
Total Protein: 7.2 g/dL (ref 6.0–8.5)
eGFR: 96 mL/min/{1.73_m2} (ref >59)

## 2021-12-24 LAB — CBC
Hematocrit: 40.7 % (ref 34.0–46.6)
Hemoglobin: 13.3 g/dL (ref 11.1–15.9)
MCH: 29.8 pg (ref 26.6–33.0)
MCHC: 32.7 g/dL (ref 31.5–35.7)
MCV: 91 fL (ref 79–97)
Platelets: 245 10*3/uL (ref 150–450)
RBC: 4.47 x10E6/uL (ref 3.77–5.28)
RDW: 12.3 % (ref 11.7–15.4)
WBC: 6.1 10*3/uL (ref 3.4–10.8)

## 2021-12-24 LAB — HEMOGLOBIN A1C
Est. average glucose Bld gHb Est-mCnc: 117 mg/dL
Hgb A1c MFr Bld: 5.7 % — ABNORMAL HIGH (ref 4.8–5.6)

## 2021-12-24 LAB — TSH+FREE T4
Free T4: 1.04 ng/dL (ref 0.82–1.77)
TSH: 1.22 u[IU]/mL (ref 0.450–4.500)

## 2021-12-24 MED ORDER — SEMAGLUTIDE-WEIGHT MANAGEMENT 0.25 MG/0.5ML ~~LOC~~ SOAJ
0.2500 mg | SUBCUTANEOUS | 0 refills | Status: DC
  Filled 2021-12-24 – 2021-12-26 (×2): qty 2, 28d supply, fill #0

## 2021-12-24 NOTE — Telephone Encounter (Signed)
[facility] faxed refill request for the following medications:   Semaglutide-Weight Management 0.25 MG/0.5ML SOAJ     Please advise.

## 2021-12-24 NOTE — Telephone Encounter (Signed)
Medication Refill - Medication: Semaglutide-Weight Management 0.25 MG/0.5ML SOAJ   Has the patient contacted their pharmacy? Yes.   Patient states [facility] said they would add her to their wait list.  Preferred Pharmacy (with phone number or street name):  [facility] Phone: [phone]  Fax: [phone]     Has the patient been seen for an appointment in the last year OR does the patient have an upcoming appointment? Yes.    Agent: Please be advised that RX refills may take up to 3 business days. We ask that you follow-up with your pharmacy.

## 2021-12-24 NOTE — Telephone Encounter (Signed)
Requested medication (s) are due for refill today - no  Requested medication (s) are on the active medication list -yes  Future visit scheduled -yes  Last refill: 12/23/21  Notes to clinic: Attempted to call patient regarding Rx- no alternative pharmacy given- message will be forwarded to provider to make her aware patient not able to fill Rx at this time  Requested Prescriptions  Pending Prescriptions Disp Refills   Semaglutide-Weight Management 0.25 MG/0.5ML SOAJ 2 mL 0    Sig: Inject 0.25 mg into the skin once a week for 28 days.     Endocrinology:  Diabetes - GLP-1 Receptor Agonists - semaglutide Failed - 12/24/2021  2:21 PM      Failed - HBA1C in normal range and within 180 days    Hgb A1c MFr Bld  Date Value Ref Range Status  12/23/2021 5.7 (H) 4.8 - 5.6 % Final    Comment:             Prediabetes: 5.7 - 6.4          Diabetes: >6.4          Glycemic control for adults with diabetes: <7.0          Passed - Cr in normal range and within 360 days    Creatinine, Ser  Date Value Ref Range Status  12/23/2021 0.81 0.57 - 1.00 mg/dL Final         Passed - Valid encounter within last 6 months    Recent Outpatient Visits           Yesterday Plantar fasciitis, bilateral   [facility] [place], [place], [place]   2 weeks ago Spasm of muscle of lower back   [facility] [place], [place], PA-C   1 month ago Chest wall discomfort   [facility] Simmons-Robinson, [place], MD   1 month ago Cough in adult   [facility] Simmons-Robinson, [place], MD       Future Appointments             In 1 month Simmons-Robinson, [place], MD [facility]               Requested Prescriptions  Pending Prescriptions Disp Refills   Semaglutide-Weight Management 0.25 MG/0.5ML SOAJ 2 mL 0    Sig: Inject 0.25 mg into the skin once a week for 28 days.     Endocrinology:  Diabetes - GLP-1 Receptor Agonists -  semaglutide Failed - 12/24/2021  2:21 PM      Failed - HBA1C in normal range and within 180 days    Hgb A1c MFr Bld  Date Value Ref Range Status  12/23/2021 5.7 (H) 4.8 - 5.6 % Final    Comment:             Prediabetes: 5.7 - 6.4          Diabetes: >6.4          Glycemic control for adults with diabetes: <7.0          Passed - Cr in normal range and within 360 days    Creatinine, Ser  Date Value Ref Range Status  12/23/2021 0.81 0.57 - 1.00 mg/dL Final         Passed - Valid encounter within last 6 months    Recent Outpatient Visits           Yesterday Plantar fasciitis, bilateral   [facility] Ronnald Ramp, MD   2 weeks  ago Spasm of muscle of lower back   [facility] [place], [place], [place]   1 month ago Chest wall discomfort   [facility] [place], [place], MD   1 month ago Cough in adult   [facility] Simmons-Robinson, Tawanna Cooler, MD       Future Appointments             In 1 month Simmons-Robinson, Tawanna Cooler, MD [facility], [place]

## 2021-12-25 ENCOUNTER — Other Ambulatory Visit: Payer: Self-pay

## 2021-12-26 ENCOUNTER — Other Ambulatory Visit: Payer: Self-pay

## 2021-12-26 ENCOUNTER — Telehealth: Payer: Self-pay | Admitting: Family Medicine

## 2021-12-26 NOTE — Telephone Encounter (Signed)
Pt states she received a vm stating an unknown pharmacy was trying to fill her Wegovy   Pt states the name of the pharmacy was not left  Pt states [facility] does have her permission to fill Rx  Pt requesting a cb to discuss further

## 2021-12-26 NOTE — Telephone Encounter (Signed)
Received another fax from [facility] requesting this medication.  Looks like it was sent to the wrong pharmacy.  Please send to correct pharmacy

## 2021-12-28 DIAGNOSIS — I872 Venous insufficiency (chronic) (peripheral): Secondary | ICD-10-CM | POA: Insufficient documentation

## 2021-12-28 NOTE — Progress Notes (Signed)
MRN : [id number]  Haley Washington is a 36 y.o. (10-18-1985) female who presents with chief complaint of varicose veins hurt.  History of Present Illness:  The patient is seen for evaluation of symptomatic varicose veins. The patient relates burning and stinging which worsened steadily throughout the course of the day, particularly with standing. The patient also notes an aching and throbbing pain over the varicosities, particularly with prolonged dependent positions. The symptoms are significantly improved with elevation.  The patient also notes that during hot weather the symptoms are greatly intensified. The patient states the pain from the varicose veins interferes with work, daily exercise, shopping and household maintenance. At this point, the symptoms are persistent and severe enough that they're having a negative impact on lifestyle and are interfering with daily activities.  There is no history of DVT, PE or superficial thrombophlebitis. There is no history of ulceration or hemorrhage. The patient denies a significant family history of varicose veins.  She has had two children and noted the vein became much worse after each child.  The patient has not worn graduated compression in the past. At the present time the patient has not been using over-the-counter analgesics. There is no history of prior surgical intervention or sclerotherapy.    No outpatient medications have been marked as taking for the 12/29/21 encounter (Appointment) with Gilda Crease, Latina Craver, MD.    Past Medical History:  Diagnosis Date   Acne    has been on aldactone in the past.  Also tried Accutane without improvement in her symptoms    Allergy    History of chicken pox    Hx: UTI (urinary tract infection)    Hyperlipidemia    Low back pain 2017   improved with physical therapy   Morbid obesity with BMI of 40.0-44.9, adult [facility])     Past Surgical History:  Procedure Laterality Date   ADENOIDECTOMY      DILATION AND EVACUATION N/A 10/27/2016   Procedure: DILATATION AND EVACUATION;  Surgeon: Waynard Reeds, MD;  Location: [facility] SU[facility];  Service: Gynecology;  Laterality: N/A;    Social History Social History   Tobacco Use   Smoking status: Never    Passive exposure: Never   Smokeless tobacco: Never  Vaping Use   Vaping Use: Never used  Substance Use Topics   Alcohol use: Yes    Comment: rarely   Drug use: No    Family History Family History  Problem Relation Age of Onset   Hyperlipidemia Mother    Hypertension Mother    Obesity Mother    Alcohol abuse Father        recurring   Breast cancer Maternal Aunt    Mental illness Maternal Grandmother        anxiety   Arthritis Maternal Grandfather    Cancer Maternal Grandfather        colon, diagnosed in 32's   Diabetes Maternal Grandfather    Heart disease Paternal Grandfather    Cancer Paternal Grandfather        lymphoma, ? lung CA- smoker   Obesity Brother     Allergies  Allergen Reactions   Lactose Intolerance (Gi) Other (See Comments)    GI upset     REVIEW OF SYSTEMS (Negative unless checked)  Constitutional: [] Weight loss  [] Fever  [] Chills Cardiac: [] Chest pain   [] Chest pressure   [] Palpitations   [] Shortness of breath when laying flat   [] Shortness of breath with exertion. Vascular:  []   Pain in legs with walking   [x] Pain in legs with standing  [] History of DVT   [] Phlebitis   [] Swelling in legs   [x] Varicose veins   [] Non-healing ulcers Pulmonary:   [] Uses home oxygen   [] Productive cough   [] Hemoptysis   [] Wheeze  [] COPD   [] Asthma Neurologic:  [] Dizziness   [] Seizures   [] History of stroke   [] History of TIA  [] Aphasia   [] Vissual changes   [] Weakness or numbness in arm   [] Weakness or numbness in leg Musculoskeletal:   [] Joint swelling   [] Joint pain   [] Low back pain Hematologic:  [] Easy bruising  [] Easy bleeding   [] Hypercoagulable state   [] Anemic Gastrointestinal:  [] Diarrhea   [] Vomiting   [] Gastroesophageal reflux/heartburn   [] Difficulty swallowing. Genitourinary:  [] Chronic kidney disease   [] Difficult urination  [] Frequent urination   [] Blood in urine Skin:  [] Rashes   [] Ulcers  Psychological:  [] History of anxiety   []  History of major depression.  Physical Examination  There were no vitals filed for this visit. There is no height or weight on file to calculate BMI. Gen: WD/WN, NAD Head: [place]/AT, No temporalis wasting.  Ear/Nose/Throat: Hearing grossly intact, nares w/o erythema or drainage, pinna without lesions Eyes: PER, EOMI, sclera nonicteric.  Neck: Supple, no gross masses.  No JVD.  Pulmonary:  Good air movement, no audible wheezing, no use of accessory muscles.  Cardiac: RRR, precordium not hyperdynamic. Vascular:  Large varicosities present, greater than 10 mm bilaterally.  Veins are tender to palpation  Mild venous stasis changes to the legs bilaterally.  Trace soft pitting edema CEAP C3sEpAsPr Vessel Right Left  Radial Palpable Palpable  Gastrointestinal: soft, non-distended. No guarding/no peritoneal signs.  Musculoskeletal: M/S 5/5 throughout.  No deformity.  Neurologic: CN 2-12 intact. Pain and light touch intact in extremities.  Symmetrical.  Speech is fluent. Motor exam as listed above. Psychiatric: Judgment intact, Mood & affect appropriate for pt's clinical situation. Dermatologic: Venous rashes no ulcers noted.  No changes consistent with cellulitis. Lymph : No lichenification or skin changes of chronic lymphedema.  CBC Lab Results  Component Value Date   WBC 6.1 12/23/2021   HGB 13.3 12/23/2021   HCT 40.7 12/23/2021   MCV 91 12/23/2021   PLT 245 12/23/2021    BMET    Component Value Date/Time   NA 139 12/23/2021 1351   K 4.5 12/23/2021 1351   CL 103 12/23/2021 1351   CO2 23 12/23/2021 1351   GLUCOSE 109 (H) 12/23/2021 1351   GLUCOSE 102 (H) 07/24/2020 1933   BUN 16 12/23/2021 1351   CREATININE 0.81 12/23/2021 1351   CALCIUM 9.6  12/23/2021 1351   GFRNONAA >60 07/24/2020 1933   GFRAA >60 10/26/2016 1745   Estimated Creatinine Clearance: 123.2 mL/min (by C-G formula based on SCr of 0.81 mg/dL).  COAG Lab Results  Component Value Date   INR 0.95 10/26/2016    Radiology MM DIAG BREAST TOMO BILATERAL  Result Date: 12/10/2021 CLINICAL DATA:  36 year old female presenting with bilateral intermittent diffuse breast pain, that increases with positional changes (i.e. bending over). The patient denies any palpable abnormalities or nipple discharge. This is a baseline examination. EXAM: DIGITAL DIAGNOSTIC BILATERAL MAMMOGRAM WITH TOMOSYNTHESIS TECHNIQUE: Bilateral digital diagnostic mammography and breast tomosynthesis was performed. COMPARISON:  None available. ACR Breast Density Category b: There are scattered areas of fibroglandular density. FINDINGS: There are no suspicious masses, calcifications, or other findings in bilateral breasts. IMPRESSION: No mammographic etiology for bilateral intermittent diffuse breast pain.  No evidence of malignancy. RECOMMENDATION: Any further workup of the patient's symptoms should be based on the clinical assessment. Recommend routine annual screening mammogram beginning at age 66. I have discussed the findings and recommendations with the patient. If applicable, a reminder letter will be sent to the patient regarding the next appointment. BI-RADS CATEGORY  1: Negative. Electronically Signed   By: Jacob Moores M.D.   On: 12/10/2021 09:54    Assessment/Plan 1. Varicose veins of bilateral lower extremities with pain  Recommend:  The patient has large symptomatic varicose veins that are painful and associated with swelling.  She is CEAP C3sEpAsPr  I have had a long discussion with the patient regarding  varicose veins and why they cause symptoms.  Patient will begin wearing graduated compression stockings class 1 on a daily basis, beginning first thing in the morning and removing them in  the evening. The patient is instructed specifically not to sleep in the stockings.    The patient  will also begin using over-the-counter analgesics such as Motrin 600 mg po TID to help control the symptoms.    In addition, behavioral modification including elevation during the day will be initiated.    Pending the results of these changes the  patient will be reevaluated in three months.   An ultrasound of the venous system will be obtained.   Further plans will be based on the ultrasound results and whether conservative therapies are successful at eliminating the pain and swelling.  - VAS [place] LOWER EXTREMITY VENOUS REFLUX; Future  2. Lymphedema Recommend:  The patient has large symptomatic varicose veins that are painful and associated with swelling.  She is CEAP C3sEpAsPr  I have had a long discussion with the patient regarding  varicose veins and why they cause symptoms.  Patient will begin wearing graduated compression stockings class 1 on a daily basis, beginning first thing in the morning and removing them in the evening. The patient is instructed specifically not to sleep in the stockings.    The patient  will also begin using over-the-counter analgesics such as Motrin 600 mg po TID to help control the symptoms.    In addition, behavioral modification including elevation during the day will be initiated.    Pending the results of these changes the  patient will be reevaluated in three months.   An ultrasound of the venous system will be obtained.   Further plans will be based on the ultrasound results and whether conservative therapies are successful at eliminating the pain and swelling.   3. Chronic venous insufficiency Recommend:  The patient has large symptomatic varicose veins that are painful and associated with swelling.  She is CEAP C3sEpAsPr  I have had a long discussion with the patient regarding  varicose veins and why they cause symptoms.  Patient will begin  wearing graduated compression stockings class 1 on a daily basis, beginning first thing in the morning and removing them in the evening. The patient is instructed specifically not to sleep in the stockings.    The patient  will also begin using over-the-counter analgesics such as Motrin 600 mg po TID to help control the symptoms.    In addition, behavioral modification including elevation during the day will be initiated.    Pending the results of these changes the  patient will be reevaluated in three months.   An ultrasound of the venous system will be obtained.   Further plans will be based on the ultrasound results and whether  conservative therapies are successful at eliminating the pain and swelling.     Levora Dredge, MD  12/28/2021 2:21 PM

## 2021-12-29 ENCOUNTER — Ambulatory Visit (INDEPENDENT_AMBULATORY_CARE_PROVIDER_SITE_OTHER): Payer: BC Managed Care – PPO | Admitting: Vascular Surgery

## 2021-12-29 ENCOUNTER — Encounter (INDEPENDENT_AMBULATORY_CARE_PROVIDER_SITE_OTHER): Payer: Self-pay | Admitting: Vascular Surgery

## 2021-12-29 VITALS — BP 123/87 | HR 75 | Resp 16 | Wt 269.4 lb

## 2021-12-29 DIAGNOSIS — I83813 Varicose veins of bilateral lower extremities with pain: Secondary | ICD-10-CM

## 2021-12-29 DIAGNOSIS — I872 Venous insufficiency (chronic) (peripheral): Secondary | ICD-10-CM | POA: Diagnosis not present

## 2021-12-29 DIAGNOSIS — I890 Lymphedema, not elsewhere classified: Secondary | ICD-10-CM

## 2021-12-29 NOTE — Telephone Encounter (Signed)
Pt called back to provide the NPI# [id number] for [facility]

## 2021-12-29 NOTE — Telephone Encounter (Signed)
Disregard other message about sending script elsewhere. Patient needs to verify what pharmacy to send this to.

## 2021-12-29 NOTE — Telephone Encounter (Signed)
Patient called in says wants to use mail order address for  wegovy , [facility] [place] - [address]  AT Portal to [facility]     Phone: [phone]  Fax: [phone]

## 2021-12-30 MED ORDER — SEMAGLUTIDE-WEIGHT MANAGEMENT 0.25 MG/0.5ML ~~LOC~~ SOAJ: 0.2500 mg | SUBCUTANEOUS | 0 refills | Status: DC

## 2021-12-30 NOTE — Telephone Encounter (Signed)
Prescription for semaglutide 0.25 mg weekly sent to [facility] service pharmacy.  Ronnald Ramp, MD  [facility]

## 2021-12-31 ENCOUNTER — Ambulatory Visit: Payer: No Typology Code available for payment source | Admitting: Family Medicine

## 2021-12-31 NOTE — Assessment & Plan Note (Signed)
Chronic, improved Decreased frequency of painful episodes She has had normal chest x-ray and normal mammogram Suspect this may be related to breast hypertrophy in setting of obesity with BMI of 46 Will attempt to help with weight management Wegovy as stated above

## 2021-12-31 NOTE — Assessment & Plan Note (Signed)
Exam and history consistent with plantars fasciitis Recommended that patient use tennis ball stretches or frozen water bottle stretches Patient given handout, see AVS

## 2021-12-31 NOTE — Assessment & Plan Note (Signed)
Patient with elevated BMI qualifying for morbid obesity, 46.24 We will start [facility] panel at 0.25 mg weekly We will collect CMP, TSH and free T4 as well as A1c and CBC

## 2021-12-31 NOTE — Assessment & Plan Note (Signed)
We will collect hemoglobin A1c, CMP, CBC and TSH with free T4 Will start Wegovy to assist with weight loss Follow-up in 1 month

## 2022-01-01 NOTE — Telephone Encounter (Signed)
Jenn from [facility] stated the medication Semaglutide-Weight Management 0.25 MG/0.5ML SOAJ is back ordered. Is asking for an alternative for the patient.   REF # [id number]  Please advise.

## 2022-01-02 ENCOUNTER — Encounter: Payer: Self-pay | Admitting: Family Medicine

## 2022-01-02 NOTE — Telephone Encounter (Signed)
Will send MyChart message to patient notifying her of the limitations for Ozempic we will have patient clarify with her insurance if they would consider any other options in the GLP-1 agonist class for weight loss indication.   Ronnald Ramp, MD  [facility]

## 2022-01-04 ENCOUNTER — Encounter (INDEPENDENT_AMBULATORY_CARE_PROVIDER_SITE_OTHER): Payer: Self-pay | Admitting: Vascular Surgery

## 2022-01-08 ENCOUNTER — Encounter (INDEPENDENT_AMBULATORY_CARE_PROVIDER_SITE_OTHER): Payer: Self-pay | Admitting: Vascular Surgery

## 2022-01-08 ENCOUNTER — Other Ambulatory Visit: Payer: Self-pay | Admitting: Family Medicine

## 2022-01-08 MED ORDER — SAXENDA 18 MG/3ML ~~LOC~~ SOPN: 0.6000 mg | PEN_INJECTOR | Freq: Every day | SUBCUTANEOUS | 0 refills | Status: DC

## 2022-01-08 NOTE — Progress Notes (Signed)
Received MyChart message from patient that she was unable to locate a pharmacy with [facility] supply available.  Patient states that she spoke with insurance company and would be approved to start Saxenda.  Patient is requesting Saxenda prescription.  Saxenda 0.6 mg daily prescription sent to the [facility] pharmacy.  Ronnald Ramp, MD [facility]

## 2022-01-21 MED ORDER — LIRAGLUTIDE -WEIGHT MANAGEMENT 18 MG/3ML ~~LOC~~ SOPN: PEN_INJECTOR | SUBCUTANEOUS | 0 refills | Status: DC

## 2022-01-29 NOTE — Progress Notes (Signed)
I,Haley Washington,acting as a scribe for [employer], MD.,have documented all relevant documentation on the behalf of Haley Foster, MD,as directed by  Haley Foster, MD while in the presence of Haley Foster, MD.   Established patient visit   Patient: Haley Washington   DOB: 08/28/1985   37 y.o. Female  MRN: [id number] Visit Date: 01/30/2022  Today's healthcare provider: [name], MD   Chief Complaint  Patient presents with   Weight Management Screening   Subjective    HPI  Follow up for Weight management  The patient was last seen for this 1 months ago. Changes made at last visit include start [facility]. Has not been able to get it due to insurance coverage issues. Was recently prescribed victoza which may be an option as it is more affordable  Weight has been steady   Wt Readings from Last 3 Encounters:  01/30/22 269 lb (122 kg)  12/29/21 269 lb 6.4 oz (122.2 kg)  12/23/21 267 lb 1.6 oz (121.2 kg)     -----------------------------------------------------------------------------------------   Filed Weights   01/30/22 1103  Weight: 269 lb (122 kg)    Medications: Outpatient Medications Prior to Visit  Medication Sig   Azelastine-Fluticasone 137-50 MCG/ACT SUSP 1 spray in each nostril Nasally Twice a day for 30 days   EPINEPHrine 0.3 mg/0.3 mL IJ SOAJ injection Inject into the muscle.   levonorgestrel (MIRENA, 52 MG,) 20 MCG/DAY IUD Device provided by care center   montelukast (SINGULAIR) 10 MG tablet 1 tablet Orally Once a day for 30 days   [DISCONTINUED] Liraglutide -Weight Management (SAXENDA) 18 MG/3ML SOPN Inject 0.6 mg into the skin daily.   [DISCONTINUED] Liraglutide -Weight Management 18 MG/3ML SOPN Inject 0.6 mg into the skin daily for 30 days, THEN 1.2 mg daily.   No facility-administered medications prior to visit.    Review of Systems     Objective    BP 124/86 (BP Location: Left Arm,  Patient Position: Sitting, Cuff Size: Large)   Pulse 75   Resp 16   Wt 269 lb (122 kg)   BMI 46.17 kg/m    Physical Exam Vitals reviewed.  Constitutional:      General: She is not in acute distress.    Appearance: Normal appearance. She is not ill-appearing, toxic-appearing or diaphoretic.  Eyes:     Conjunctiva/sclera: Conjunctivae normal.  Cardiovascular:     Rate and Rhythm: Normal rate and regular rhythm.     Pulses: Normal pulses.     Heart sounds: Normal heart sounds. No murmur heard.    No friction rub. No gallop.  Pulmonary:     Effort: Pulmonary effort is normal. No respiratory distress.     Breath sounds: Normal breath sounds. No stridor. No wheezing, rhonchi or rales.  Abdominal:     General: Bowel sounds are normal. There is no distension.     Palpations: Abdomen is soft.     Tenderness: There is no abdominal tenderness.  Musculoskeletal:     Right lower leg: No edema.     Left lower leg: No edema.  Skin:    Findings: No erythema or rash.  Neurological:     Mental Status: She is alert and oriented to person, place, and time.      No results found for any visits on 01/30/22.  Assessment & Plan     Problem List Items Addressed This Visit       Other   Morbid obesity with BMI of  40.0-44.9, adult ([place]) - Primary    Stable weight since last visit, pre0diabetes and BMI >40 Unable to start GLP-1 agonists due to cost barriers  Will start phentermine 15mg  daily and follow up in 3 months  Discussed monitoring for increased HR, HA, or increased blood pressure on this medication, pt voiced understanding CMP completed one month ago and within normal limits       Relevant Medications   phentermine 15 MG capsule     Return in about 3 months (around 05/01/2022) for weight loss management .      I, Haley Foster, MD, have reviewed all documentation for this visit.  Portions of this information were initially documented by the CMA and reviewed by me  for thoroughness and accuracy.      Haley Foster, MD  [facility] [phone] (phone) [phone] (fax)  [place]

## 2022-01-30 ENCOUNTER — Telehealth: Payer: Self-pay | Admitting: Family Medicine

## 2022-01-30 ENCOUNTER — Encounter: Payer: Self-pay | Admitting: Family Medicine

## 2022-01-30 ENCOUNTER — Ambulatory Visit: Payer: BC Managed Care – PPO | Admitting: Family Medicine

## 2022-01-30 VITALS — BP 124/86 | HR 75 | Resp 16 | Wt 269.0 lb

## 2022-01-30 DIAGNOSIS — Z6841 Body Mass Index (BMI) 40.0 and over, adult: Secondary | ICD-10-CM | POA: Diagnosis not present

## 2022-01-30 MED ORDER — PHENTERMINE HCL 15 MG PO CAPS: 15.0000 mg | ORAL_CAPSULE | ORAL | 2 refills | Status: DC

## 2022-01-30 NOTE — Telephone Encounter (Signed)
[facility] refill request for the following medications:  Victoza 3's pen 18mg /51ml pen   Please advise.

## 2022-01-30 NOTE — Telephone Encounter (Signed)
Will DC prior authorization for Victoza and replace with phentermine due to cost   Haley Foster, MD  [facility]

## 2022-01-30 NOTE — Assessment & Plan Note (Addendum)
Stable weight since last visit, pre0diabetes and BMI >40 Unable to start GLP-1 agonists due to cost barriers  Will start phentermine 15mg  daily and follow up in 3 months  Discussed monitoring for increased HR, HA, or increased blood pressure on this medication, pt voiced understanding CMP completed one month ago and within normal limits

## 2022-01-30 NOTE — Telephone Encounter (Signed)
Are we still doing the Victoza or just the Phentermine?

## 2022-01-30 NOTE — Patient Instructions (Addendum)
Phentermine was prescribed to help with weight loss   Please plan to follow up with me in three months

## 2022-02-04 ENCOUNTER — Encounter: Payer: Self-pay | Admitting: Family Medicine

## 2022-02-09 ENCOUNTER — Other Ambulatory Visit: Payer: Self-pay | Admitting: Family Medicine

## 2022-02-18 ENCOUNTER — Encounter: Payer: Self-pay | Admitting: Family Medicine

## 2022-02-18 ENCOUNTER — Ambulatory Visit: Payer: BC Managed Care – PPO | Admitting: Family Medicine

## 2022-02-18 VITALS — BP 134/88 | HR 102 | Temp 98.6°F | Resp 16 | Wt 266.0 lb

## 2022-02-18 DIAGNOSIS — J020 Streptococcal pharyngitis: Secondary | ICD-10-CM

## 2022-02-18 DIAGNOSIS — J069 Acute upper respiratory infection, unspecified: Secondary | ICD-10-CM

## 2022-02-18 LAB — POCT RAPID STREP A (OFFICE): Rapid Strep A Screen: POSITIVE — AB

## 2022-02-18 LAB — POC COVID19 BINAXNOW: SARS Coronavirus 2 Ag: NEGATIVE

## 2022-02-18 MED ORDER — AMOXICILLIN 500 MG PO CAPS: 1000.0000 mg | ORAL_CAPSULE | Freq: Every day | ORAL | 0 refills | Status: AC

## 2022-02-18 NOTE — Progress Notes (Signed)
I,[name],acting as a scribe for [employer], MD.,have documented all relevant documentation on the behalf of [name], MD,as directed by  [name], MD while in the presence of [name], MD.   Established patient visit   Patient: Haley Washington   DOB: 05-Jan-1986   37 y.o. Female  MRN: [id number] Visit Date: 02/18/2022  Today's healthcare provider: [name], MD   Chief Complaint  Patient presents with   Sore Throat   Subjective    Sore Throat  This is a new problem. Episode onset: Monday. The problem has been gradually worsening. There has been no fever. Associated symptoms include congestion, coughing, ear pain, a hoarse voice, swollen glands and trouble swallowing. Associated symptoms comments: Runny nose, chills. She has tried acetaminophen and cool liquids (Tylenol,Naproxen and Dayquil) for the symptoms. The treatment provided no relief.    She states that her 62 month old has been more fussy and she thinks she may have gotten this from her.   Medications: Outpatient Medications Prior to Visit  Medication Sig   Azelastine-Fluticasone 137-50 MCG/ACT SUSP 1 spray in each nostril Nasally Twice a day for 30 days   EPINEPHrine 0.3 mg/0.3 mL IJ SOAJ injection Inject into the muscle.   levonorgestrel (MIRENA, 52 MG,) 20 MCG/DAY IUD Device provided by care center   montelukast (SINGULAIR) 10 MG tablet 1 tablet Orally Once a day for 30 days   phentermine 15 MG capsule Take 1 capsule (15 mg total) by mouth every morning.   No facility-administered medications prior to visit.    Review of Systems  HENT:  Positive for congestion, ear pain, hoarse voice and trouble swallowing.   Respiratory:  Positive for cough.        Objective    BP 134/88 (BP Location: Left Arm, Patient Position: Sitting, Cuff Size: Large)   Pulse (!) 102   Temp 98.6 F (37 C) (Oral)   Resp 16   Wt 266 lb (120.7 kg)    SpO2 99%   BMI 45.66 kg/m    Physical Exam Constitutional:      General: She is not in acute distress.    Appearance: She is obese. She is ill-appearing. She is not toxic-appearing or diaphoretic.  HENT:     Right Ear: Ear canal normal. Tenderness present. No middle ear effusion.     Left Ear: Ear canal normal. No tenderness.  No middle ear effusion.     Ears:     Comments: Bulging TM on right     Mouth/Throat:     Mouth: Mucous membranes are moist.     Pharynx: Posterior oropharyngeal erythema present.     Tonsils: Tonsillar exudate present.  Pulmonary:     Effort: Pulmonary effort is normal. No respiratory distress.  Neurological:     Mental Status: She is alert.       Results for orders placed or performed in visit on 02/18/22  POCT rapid strep A  Result Value Ref Range   Rapid Strep A Screen Positive (A) Negative  POC COVID-19  Result Value Ref Range   SARS Coronavirus 2 Ag Negative Negative    Assessment & Plan     Problem List Items Addressed This Visit       Respiratory   Strep pharyngitis - Primary    Positive strep test in office today  Prescribed amox 1000mg  daily for 10 days  Recommended warm teas with honey to help with sore throat  Note  written to return to work on Monday 02/23/22        Relevant Orders   POCT rapid strep A (Completed)   POC COVID-19 (Completed)   Other Visit Diagnoses     URI, acute       Relevant Orders   POCT rapid strep A (Completed)   POC COVID-19 (Completed)        Return if symptoms worsen or fail to improve.       The entirety of the information documented in the History of Present Illness, Review of Systems and Physical Exam were personally obtained by me. Portions of this information were initially documented by Lyndel Pleasure, CMA and reviewed by me for thoroughness and accuracy. [name], MD     [name], MD  [facility] [phone]  (phone) [phone] (fax)  [place]

## 2022-02-18 NOTE — Assessment & Plan Note (Signed)
Positive strep test in office today  Prescribed amox 1000mg  daily for 10 days  Recommended warm teas with honey to help with sore throat  Note written to return to work on Monday 02/23/22

## 2022-02-18 NOTE — Patient Instructions (Signed)
Strep Throat, Adult Strep throat is an infection of the throat. It is caused by germs (bacteria). Strep throat is common during the cold months of the year. It mostly affects children who are 5-37 years old. However, people of all ages can get it at any time of the year. This infection spreads from person to person through coughing, sneezing, or having close contact. What are the causes? This condition is caused by the Streptococcus pyogenes germ. What increases the risk? You care for young children. Children are more likely to get strep throat and may spread it to others. You go to crowded places. Germs can spread easily in such places. You kiss or touch someone who has strep throat. What are the signs or symptoms? Fever or chills. Redness, swelling, or pain in the tonsils or throat. Pain or trouble when swallowing. White or yellow spots on the tonsils or throat. Tender glands in the neck and under the jaw. Bad breath. Red rash all over the body. This is rare. How is this treated? Medicines that kill germs (antibiotics). Medicines that treat pain or fever. These include: Ibuprofen or acetaminophen. Aspirin, only for people who are over the age of 18. Cough drops. Throat sprays. Follow these instructions at home: Medicines  Take over-the-counter and prescription medicines only as told by your doctor. Take your antibiotic medicine as told by your doctor. Do not stop taking the antibiotic even if you start to feel better. Eating and drinking  If you have trouble swallowing, eat soft foods until your throat feels better. Drink enough fluid to keep your pee (urine) pale yellow. To help with pain, you may have: Warm fluids, such as soup and tea. Cold fluids, such as frozen desserts or popsicles. General instructions Rinse your mouth (gargle) with a salt-water mixture 3-4 times a day or as needed. To make a salt-water mixture, dissolve -1 tsp (3-6 g) of salt in 1 cup (237 mL) of warm  water. Rest as much as you can. Stay home from work or school until you have been taking antibiotics for 24 hours. Do not smoke or use any products that contain nicotine or tobacco. If you need help quitting, ask your doctor. Keep all follow-up visits. How is this prevented?  Do not share food, drinking cups, or personal items. They can cause the germs to spread. Wash your hands well with soap and water. Make sure that all people in your house wash their hands well. Have family members tested if they have a fever or a sore throat. They may need an antibiotic if they have strep throat. Contact a doctor if: You have swelling in your neck that keeps getting bigger. You get a rash, cough, or earache. You cough up a thick fluid that is green, yellow-brown, or bloody. You have pain that does not get better with medicine. Your symptoms get worse instead of getting better. You have a fever. Get help right away if: You vomit. You have a very bad headache. Your neck hurts or feels stiff. You have chest pain or are short of breath. You have drooling, very bad throat pain, or changes in your voice. Your neck is swollen, or the skin gets red and tender. Your mouth is dry, or you are peeing less than normal. You keep feeling more tired or have trouble waking up. Your joints are red or painful. These symptoms may be an emergency. Do not wait to see if the symptoms will go away. Get help right away. Call   your local emergency services (911 in the [place] Summary Strep throat is an infection of the throat. It is caused by germs (bacteria). This infection can spread from person to person through coughing, sneezing, or having close contact. Take your medicines, including antibiotics, as told by your doctor. Do not stop taking the antibiotic even if you start to feel better. To prevent the spread of germs, wash your hands well with soap and water. Have others do the same. Do not share food, drinking cups,  or personal items. Get help right away if you have a bad headache, chest pain, shortness of breath, a stiff or painful neck, or you vomit. This information is not intended to replace advice given to you by your health care provider. Make sure you discuss any questions you have with your health care provider. Document Revised: 04/30/2020 Document Reviewed: 04/30/2020 Elsevier Patient Education  2023 [employer]vier Inc.  

## 2022-03-26 ENCOUNTER — Ambulatory Visit: Payer: BC Managed Care – PPO | Admitting: Family Medicine

## 2022-03-26 ENCOUNTER — Encounter: Payer: Self-pay | Admitting: Family Medicine

## 2022-03-26 VITALS — BP 114/83 | HR 101 | Temp 99.4°F | Resp 16 | Wt 268.0 lb

## 2022-03-26 DIAGNOSIS — R059 Cough, unspecified: Secondary | ICD-10-CM

## 2022-03-26 DIAGNOSIS — M791 Myalgia, unspecified site: Secondary | ICD-10-CM

## 2022-03-26 DIAGNOSIS — J0140 Acute pansinusitis, unspecified: Secondary | ICD-10-CM | POA: Diagnosis not present

## 2022-03-26 DIAGNOSIS — J020 Streptococcal pharyngitis: Secondary | ICD-10-CM

## 2022-03-26 DIAGNOSIS — R5383 Other fatigue: Secondary | ICD-10-CM | POA: Diagnosis not present

## 2022-03-26 DIAGNOSIS — J029 Acute pharyngitis, unspecified: Secondary | ICD-10-CM | POA: Insufficient documentation

## 2022-03-26 DIAGNOSIS — R6889 Other general symptoms and signs: Secondary | ICD-10-CM

## 2022-03-26 LAB — POCT INFLUENZA A/B
Influenza A, POC: NEGATIVE
Influenza B, POC: NEGATIVE

## 2022-03-26 LAB — POC COVID19 BINAXNOW: SARS Coronavirus 2 Ag: NEGATIVE

## 2022-03-26 LAB — POCT RAPID STREP A (OFFICE): Rapid Strep A Screen: POSITIVE — AB

## 2022-03-26 MED ORDER — AMOXICILLIN-POT CLAVULANATE 875-125 MG PO TABS: 1.0000 | ORAL_TABLET | Freq: Two times a day (BID) | ORAL | 0 refills | Status: DC

## 2022-03-26 NOTE — Assessment & Plan Note (Signed)
Acute  Patient is stable, with normal VS  Left ear with bulging TM, tender LAD on left anterior cervical region and conjunctivitis symptoms reported  Will treat with Augmentin for 10 days and have patient follow up within 14 days if symptoms are not improved  Patient given information of infection prevention, see AVS

## 2022-03-26 NOTE — Progress Notes (Signed)
I,[name],acting as a scribe for [employer], MD.,have documented all relevant documentation on the behalf of [name], MD,as directed by  [name], MD while in the presence of [name], MD.   Established patient visit   Patient: Haley Washington   DOB: Jun 22, 1985   37 y.o. Female  MRN: [id number] Visit Date: 03/26/2022  Today's healthcare provider: [name], MD   No chief complaint on file.  Subjective    URI  This is a new problem. Episode onset: Tuesday night. There has been no fever. Associated symptoms include congestion, coughing, joint pain and a sore throat. Pertinent negatives include no diarrhea, ear pain, headaches, rhinorrhea or wheezing. Associated symptoms comments: Left eye redness this morning. She has tried acetaminophen (nasal spray) for the symptoms.      Medications: Outpatient Medications Prior to Visit  Medication Sig   Azelastine-Fluticasone 137-50 MCG/ACT SUSP 1 spray in each nostril Nasally Twice a day for 30 days   EPINEPHrine 0.3 mg/0.3 mL IJ SOAJ injection Inject into the muscle.   levonorgestrel (MIRENA, 52 MG,) 20 MCG/DAY IUD Device provided by care center   montelukast (SINGULAIR) 10 MG tablet 1 tablet Orally Once a day for 30 days   phentermine 15 MG capsule Take 1 capsule (15 mg total) by mouth every morning.   No facility-administered medications prior to visit.    Review of Systems  Constitutional:  Positive for fatigue. Negative for chills and fever.  HENT:  Positive for congestion and sore throat. Negative for ear discharge, ear pain, rhinorrhea and sinus pressure.   Respiratory:  Positive for cough. Negative for wheezing.   Gastrointestinal:  Negative for diarrhea.  Musculoskeletal:  Positive for joint pain and myalgias.  Neurological:  Negative for headaches.       Objective    BP 114/83 (BP Location: Left Arm, Patient Position: Sitting, Cuff Size:  Large)   Pulse (!) 101   Temp 99.4 F (37.4 C) (Oral)   Resp 16   Wt 268 lb (121.6 kg)   BMI 46.00 kg/m    Physical Exam HENT:     Right Ear: No drainage or tenderness. Tympanic membrane is not erythematous or bulging.     Left Ear: No drainage. Tympanic membrane is bulging. Tympanic membrane is not erythematous.     Mouth/Throat:     Tongue: No lesions.     Palate: No mass and lesions.     Pharynx: Uvula midline. Posterior oropharyngeal erythema present. No oropharyngeal exudate.     Tonsils: No tonsillar exudate. 2+ on the right. 3+ on the left.  Eyes:     General:        Right eye: No discharge.        Left eye: No discharge.  Neck:     Comments: Tender LN on the left  Cardiovascular:     Rate and Rhythm: Normal rate and regular rhythm.  Pulmonary:     Effort: Pulmonary effort is normal. No respiratory distress.     Breath sounds: Normal breath sounds. No wheezing, rhonchi or rales.  Lymphadenopathy:     Cervical: Cervical adenopathy present.     Right cervical: No superficial, deep or posterior cervical adenopathy.    Left cervical: Superficial cervical adenopathy present. No deep or posterior cervical adenopathy.       Results for orders placed or performed in visit on 03/26/22  POCT Influenza A/B  Result Value Ref Range   Influenza A, POC Negative  Negative   Influenza B, POC Negative Negative  POCT rapid strep A  Result Value Ref Range   Rapid Strep A Screen Positive (A) Negative  POC COVID-19  Result Value Ref Range   SARS Coronavirus 2 Ag Negative Negative    Assessment & Plan     Problem List Items Addressed This Visit       Respiratory   Strep pharyngitis - Primary    Acute problem  Test for strep was positive in office today  Tests for influenza A/B and COVID were both negative       Relevant Medications   amoxicillin-clavulanate (AUGMENTIN) 875-125 MG tablet   Other Relevant Orders   POCT Influenza A/B (Completed)   POCT rapid strep A  (Completed)   POC COVID-19 (Completed)   Acute non-recurrent pansinusitis    Acute  Patient is stable, with normal VS  Left ear with bulging TM, tender LAD on left anterior cervical region and conjunctivitis symptoms reported  Will treat with Augmentin for 10 days and have patient follow up within 14 days if symptoms are not improved  Patient given information of infection prevention, see AVS        Relevant Medications   amoxicillin-clavulanate (AUGMENTIN) 875-125 MG tablet   Other Visit Diagnoses     Flu-like symptoms       Relevant Orders   POCT Influenza A/B (Completed)   POCT rapid strep A (Completed)   POC COVID-19 (Completed)        Return in about 2 weeks (around 04/09/2022), or if symptoms worsen or fail to improve.       The entirety of the information documented in the History of Present Illness, Review of Systems and Physical Exam were personally obtained by me. Portions of this information were initially documented by Lyndel Pleasure, CMA . I, [name], MD have reviewed the documentation above for thoroughness and accuracy.      [name], MD  [facility] [phone] (phone) [phone] (fax)  [place]

## 2022-03-26 NOTE — Assessment & Plan Note (Signed)
Acute problem  Test for strep was positive in office today  Tests for influenza A/B and COVID were both negative

## 2022-03-26 NOTE — Patient Instructions (Signed)
Your strep test was positive today your symptoms are consistent with a sinus infection.   I will prescribe Augmentin for you to take twice daily for the next 10 days.   Please follow up if symptoms worsen despite medication in 10-14 days.    Infection Prevention in the Home If you have an infection, may have been exposed to an infection, or are taking care of someone who has an infection, it is important to know how to keep the infection from spreading. Follow your health care provider's instructions and use these guidelines to help stop the spread of infection. How infections are spread In order for an infection to spread, the following must be present: A germ. This may be a virus, bacteria, fungus, or parasite. A place for the germ to live. This may be: On or in a person, animal, plant, or food. In soil or water. On surfaces, such as a door handle. A person or animal who can develop a disease if the germ enters the body (host). The host does not have resistance to the germ. A way for the germ to enter the host. This may occur by: Direct contact with an infected person or animal. This can happen through shaking hands or hugging. Some germs can also travel through the air and spread to others. This can happen when an infected person coughs or sneezes on or near other people. Indirect contact. This occurs when the germ enters the host through contact with an infected object. Examples include: Eating or drinking food or water that is contaminated with the germ. Touching a contaminated surface with your hands, and then touching your face, eyes, nose, or mouth. Supplies needed: Soap. Alcohol-based hand sanitizer. Standard cleaning products. Disinfectants, such as bleach. Reusable cleaning cloths, sponges, or paper towels. Disposable or reusable utility gloves. How to prevent infection from spreading There are several things that you can do to help prevent infection from spreading. Take  these general actions Everyone should take the following actions to prevent the spread of infection: Wash your hands often with soap and water for at least 20 seconds. If soap and water are not available, use alcohol-based hand sanitizer. Avoid touching your face, mouth, nose, or eyes. Cough or sneeze into a tissue, sleeve, or elbow instead of into your hand or into the air. If you cough or sneeze into a tissue, throw it away immediately and wash your hands.  Keep your bathroom clean Provide soap. Change towels and washcloths frequently. Change toothbrushes often and store them separately in a clean, dry place. Clean and disinfect all surfaces, including the toilet, floor, tub, shower, and sink. Do not share personal items, such as razors, toothbrushes, deodorant, combs, brushes, towels, and washcloths. Maintain hygiene in the [facility] your hands before and after preparing food and before you eat. Clean the inside of your refrigerator each week. Keep your refrigerator set at 850F (4C) or less, and set your freezer at 50F (-18C) or less. Keep work surfaces clean. Disinfect them regularly. Wash your dishes in hot, soapy water. Air-dry your dishes or use a dishwasher. Do not share dishes or eating utensils. Handle food safely Store food carefully. Refrigerate leftovers promptly in covered containers. Throw out stale or spoiled food. Thaw foods in the refrigerator or microwave, not at room temperature. Serve foods at the proper temperature. Do not eat raw meat. Make sure it is cooked to the appropriate temperature. Cook eggs until they are firm. Wash fruits and vegetables under running  water. Use separate cutting boards, plates, and utensils for raw foods and cooked foods. Use a clean spoon each time you sample food while cooking. Do laundry the right way Wear gloves if laundry is visibly soiled. Do not shake soiled laundry. Doing that may send germs into the air. Wash laundry in  hot water. If you cannot wash the laundry right away, place it in a plastic bag and wash it as soon as possible. Be careful around animals and pets Wash your hands before and after touching animals. If you have a pet, ensure that your pet stays clean. Do not let people with weak immune systems touch bird droppings, fish tank water, or a litter box. If you have a pet cage or litter box, be sure to clean it every day. If you are sick, stay away from animals and have someone else care for them if possible. How to clean and disinfect objects and surfaces Precautions Some disinfectants work for certain germs and not others. Read the manufacturer's instructions or read online resources to determine if the product you are using will work for the germ you are trying to remove. If you choose to use bleach, use it safely. Never mix it with other cleaning products, especially those that contain ammonia. This mixture can create a dangerous gas that may be deadly. Keep proper movement of fresh air in your home (ventilation). Pour used mop water down the utility sink or toilet. Do not pour this water down the kitchen sink. Objects and surfaces  If surfaces are visibly soiled, clean them first with soap and water before disinfecting. Disinfect surfaces that are frequently touched every day. This may include: Counters. Tables. Doorknobs. Sinks and faucets. Electronics, such as: Architectural technologist. Remote controls. Keyboards. Computers and tablets. Cleaning supplies Some cleaning supplies can breed germs. Take good care of them to prevent germs from spreading. To do this: Soak toilet brushes, mops, and sponges in bleach and water for 5 minutes after each use, or according to manufacturer's instructions. Wash reusable cleaning cloths and sanitize sponges after each use. Throw away disposable gloves after one use. Replace reusable utility gloves if they are cracked or torn or if they start to peel. Additional  actions if you are sick If you live with other people:  Avoid close contact with those around you. Stay at least 3 ft (1 m) away from others, if possible. Use a separate bathroom, if possible. If possible, sleep in a separate bedroom or in a separate bed to prevent infecting other household members. Change bedroom linens each week or whenever they are soiled. Have everyone in the household wash hands often with soap and water for at least 20 seconds. If soap and water are not available, use alcohol-based hand sanitizer. In general: Stay home except to get medical care. Call ahead before visiting your health care provider. Ask others to get groceries and household supplies and to refill prescriptions for you. Avoid public areas. Try not to take public transportation. If you can, wear a mask if you need to go out of the house, or if you are in close contact with someone who is not sick. Avoid visitors until you have completely recovered, or until you have no signs and symptoms of infection. Avoid preparing food or providing care for others. If you must prepare food or provide care for others, wear a mask and wash your hands before and after doing these things. Where to find more information Centers for Disease Control and Prevention:  [employer] Summary It is important to know how to keep infection from spreading. Make sure everyone in your household washes their hands often with soap and water. Disinfect surfaces that are frequently touched every day. If you are sick, stay home except to get medical care. This information is not intended to replace advice given to you by your health care provider. Make sure you discuss any questions you have with your health care provider. Document Revised: 02/24/2021 Document Reviewed: 02/24/2021 Elsevier Patient Education  [place].

## 2022-03-30 ENCOUNTER — Ambulatory Visit (INDEPENDENT_AMBULATORY_CARE_PROVIDER_SITE_OTHER): Payer: BC Managed Care – PPO | Admitting: Vascular Surgery

## 2022-03-30 ENCOUNTER — Encounter (INDEPENDENT_AMBULATORY_CARE_PROVIDER_SITE_OTHER): Payer: BC Managed Care – PPO

## 2022-04-02 ENCOUNTER — Other Ambulatory Visit (INDEPENDENT_AMBULATORY_CARE_PROVIDER_SITE_OTHER): Payer: Self-pay | Admitting: Vascular Surgery

## 2022-04-02 ENCOUNTER — Ambulatory Visit (INDEPENDENT_AMBULATORY_CARE_PROVIDER_SITE_OTHER): Payer: BC Managed Care – PPO | Admitting: Vascular Surgery

## 2022-04-02 ENCOUNTER — Encounter (INDEPENDENT_AMBULATORY_CARE_PROVIDER_SITE_OTHER): Payer: BC Managed Care – PPO

## 2022-04-02 DIAGNOSIS — I83813 Varicose veins of bilateral lower extremities with pain: Secondary | ICD-10-CM

## 2022-04-03 NOTE — Progress Notes (Unsigned)
MRN : [id number]  Haley Washington is a 37 y.o. ([birth year]) female who presents with chief complaint of varicose veins hurt.  History of Present Illness:   The patient returns for followup evaluation 3 months after the initial visit. The patient continues to have pain in the lower extremities with dependency. The pain is lessened with elevation. Graduated compression stockings, Class I (20-30 mmHg), have been worn but the stockings do not eliminate the leg pain. Over-the-counter analgesics do not improve the symptoms. The degree of discomfort continues to interfere with daily activities. The patient notes the pain in the legs is causing problems with daily exercise, at the workplace and even with household activities and maintenance such as standing in the kitchen preparing meals and doing dishes.   Venous ultrasound shows normal deep venous system, no evidence of acute or chronic DVT.  Superficial reflux is present in the GSV's bilaterally  No outpatient medications have been marked as taking for the 04/06/22 encounter (Appointment) with Delana Meyer, Dolores Lory, MD.    Past Medical History:  Diagnosis Date   Acne    has been on aldactone in the past.  Also tried Accutane without improvement in her symptoms    Allergy    History of chicken pox    Hx: UTI (urinary tract infection)    Hyperlipidemia    Low back pain 2017   improved with physical therapy   Morbid obesity with BMI of 40.0-44.9, adult [facility])     Past Surgical History:  Procedure Laterality Date   ADENOIDECTOMY     DILATION AND EVACUATION N/A 10/27/2016   Procedure: DILATATION AND EVACUATION;  Surgeon: Vanessa Kick, MD;  Location: [facility];  Service: Gynecology;  Laterality: N/A;    Social History Social History   Tobacco Use   Smoking status: Never    Passive exposure: Never   Smokeless tobacco: Never  Vaping Use   Vaping Use: Never used  Substance Use Topics   Alcohol use: Yes    Comment: rarely    Drug use: No    Family History Family History  Problem Relation Age of Onset   Hyperlipidemia Mother    Hypertension Mother    Obesity Mother    Alcohol abuse Father        recurring   Breast cancer Maternal Aunt    Mental illness Maternal Grandmother        anxiety   Arthritis Maternal Grandfather    Cancer Maternal Grandfather        colon, diagnosed in 38's   Diabetes Maternal Grandfather    Heart disease Paternal Grandfather    Cancer Paternal Grandfather        lymphoma, ? lung CA- smoker   Obesity Brother     Allergies  Allergen Reactions   Lactose Intolerance (Gi) Other (See Comments)    GI upset   Dust Mite Extract    Molds & Smuts    Pollen Extract-Tree Extract [Pollen Extract] Itching     REVIEW OF SYSTEMS (Negative unless checked)  Constitutional: [] Weight loss  [] Fever  [] Chills Cardiac: [] Chest pain   [] Chest pressure   [] Palpitations   [] Shortness of breath when laying flat   [] Shortness of breath with exertion. Vascular:  [] Pain in legs with walking   [x] Pain in legs with standing  [] History of DVT   [] Phlebitis   [] Swelling in legs   [x] Varicose veins   [] Non-healing ulcers Pulmonary:   [] Uses home oxygen   []   Productive cough   [] Hemoptysis   [] Wheeze  [] COPD   [] Asthma Neurologic:  [] Dizziness   [] Seizures   [] History of stroke   [] History of TIA  [] Aphasia   [] Vissual changes   [] Weakness or numbness in arm   [] Weakness or numbness in leg Musculoskeletal:   [] Joint swelling   [] Joint pain   [] Low back pain Hematologic:  [] Easy bruising  [] Easy bleeding   [] Hypercoagulable state   [] Anemic Gastrointestinal:  [] Diarrhea   [] Vomiting  [] Gastroesophageal reflux/heartburn   [] Difficulty swallowing. Genitourinary:  [] Chronic kidney disease   [] Difficult urination  [] Frequent urination   [] Blood in urine Skin:  [] Rashes   [] Ulcers  Psychological:  [] History of anxiety   []  History of major depression.  Physical Examination  There were no vitals filed for  this visit. There is no height or weight on file to calculate BMI. Gen: WD/WN, NAD Head: [place]/AT, No temporalis wasting.  Ear/Nose/Throat: Hearing grossly intact, nares w/o erythema or drainage, pinna without lesions Eyes: PER, EOMI, sclera nonicteric.  Neck: Supple, no gross masses.  No JVD.  Pulmonary:  Good air movement, no audible wheezing, no use of accessory muscles.  Cardiac: RRR, precordium not hyperdynamic. Vascular:  Large varicosities present, greater than 10 mm bilaterally.  Veins are tender to palpation  Mild venous stasis changes to the legs bilaterally.  Trace soft pitting edema CEAP C3sEpAsPr Vessel Right Left  Radial Palpable Palpable  Gastrointestinal: soft, non-distended. No guarding/no peritoneal signs.  Musculoskeletal: M/S 5/5 throughout.  No deformity.  Neurologic: CN 2-12 intact. Pain and light touch intact in extremities.  Symmetrical.  Speech is fluent. Motor exam as listed above. Psychiatric: Judgment intact, Mood & affect appropriate for pt's clinical situation. Dermatologic: Venous rashes no ulcers noted.  No changes consistent with cellulitis. Lymph : No lichenification or skin changes of chronic lymphedema.  CBC Lab Results  Component Value Date   WBC 6.1 12/23/2021   HGB 13.3 12/23/2021   HCT 40.7 12/23/2021   MCV 91 12/23/2021   PLT 245 12/23/2021    BMET    Component Value Date/Time   NA 139 12/23/2021 1351   K 4.5 12/23/2021 1351   CL 103 12/23/2021 1351   CO2 23 12/23/2021 1351   GLUCOSE 109 (H) 12/23/2021 1351   GLUCOSE 102 (H) 07/24/2020 1933   BUN 16 12/23/2021 1351   CREATININE 0.81 12/23/2021 1351   CALCIUM 9.6 12/23/2021 1351   GFRNONAA >60 07/24/2020 1933   GFRAA >60 10/26/2016 1745   CrCl cannot be calculated (Patient's most recent lab result is older than the maximum 21 days allowed.).  COAG Lab Results  Component Value Date   INR 0.95 10/26/2016    Radiology No results found.   Assessment/Plan 1. Varicose veins of  bilateral lower extremities with pain Recommend  I have reviewed my previous  discussion with the patient regarding  varicose veins and why they cause symptoms. Patient will continue  wearing graduated compression stockings class 1 on a daily basis, beginning first thing in the morning and removing them in the evening.  The patient is CEAP C4sEpAsPr.  The patient has been wearing compression for more than 12 weeks with no or little benefit.  The patient has been exercising daily for more than 12 weeks. The patient has been elevating and taking OTC pain medications for more than 12 weeks.  None of these have have eliminated the pain related to the varicose veins and venous reflux or the discomfort regarding venous congestion.  In addition, behavioral modification including elevation during the day was again discussed and this will continue.  The patient has utilized over the counter pain medications and has been exercising.  However, at this time conservative therapy has not alleviated the patient's symptoms of leg pain and swelling  Recommend: laser ablation of the left great saphenous vein and then the right great saphenous vein to eliminate the symptoms of pain and swelling of the lower extremities caused by the severe superficial venous reflux disease.   2. Chronic venous insufficiency Recommend  I have reviewed my previous  discussion with the patient regarding  varicose veins and why they cause symptoms. Patient will continue  wearing graduated compression stockings class 1 on a daily basis, beginning first thing in the morning and removing them in the evening.  The patient is CEAP C4sEpAsPr.  The patient has been wearing compression for more than 12 weeks with no or little benefit.  The patient has been exercising daily for more than 12 weeks. The patient has been elevating and taking OTC pain medications for more than 12 weeks.  None of these have have eliminated the pain related to the  varicose veins and venous reflux or the discomfort regarding venous congestion.    In addition, behavioral modification including elevation during the day was again discussed and this will continue.  The patient has utilized over the counter pain medications and has been exercising.  However, at this time conservative therapy has not alleviated the patient's symptoms of leg pain and swelling  Recommend: laser ablation of the left great saphenous vein and then the right great saphenous vein to eliminate the symptoms of pain and swelling of the lower extremities caused by the severe superficial venous reflux disease.   3. Lymphedema Recommend  I have reviewed my previous  discussion with the patient regarding  varicose veins and why they cause symptoms. Patient will continue  wearing graduated compression stockings class 1 on a daily basis, beginning first thing in the morning and removing them in the evening.  The patient is CEAP C4sEpAsPr.  The patient has been wearing compression for more than 12 weeks with no or little benefit.  The patient has been exercising daily for more than 12 weeks. The patient has been elevating and taking OTC pain medications for more than 12 weeks.  None of these have have eliminated the pain related to the varicose veins and venous reflux or the discomfort regarding venous congestion.    In addition, behavioral modification including elevation during the day was again discussed and this will continue.  The patient has utilized over the counter pain medications and has been exercising.  However, at this time conservative therapy has not alleviated the patient's symptoms of leg pain and swelling  Recommend: laser ablation of the left great saphenous vein and then the right great saphenous vein to eliminate the symptoms of pain and swelling of the lower extremities caused by the severe superficial venous reflux disease.     Hortencia Pilar, MD  04/03/2022 11:01  AM

## 2022-04-06 ENCOUNTER — Encounter (INDEPENDENT_AMBULATORY_CARE_PROVIDER_SITE_OTHER): Payer: Self-pay | Admitting: Vascular Surgery

## 2022-04-06 ENCOUNTER — Ambulatory Visit (INDEPENDENT_AMBULATORY_CARE_PROVIDER_SITE_OTHER): Payer: BC Managed Care – PPO

## 2022-04-06 ENCOUNTER — Ambulatory Visit (INDEPENDENT_AMBULATORY_CARE_PROVIDER_SITE_OTHER): Payer: BC Managed Care – PPO | Admitting: Vascular Surgery

## 2022-04-06 VITALS — BP 128/86 | HR 73 | Resp 18 | Ht 64.0 in | Wt 269.2 lb

## 2022-04-06 DIAGNOSIS — I83813 Varicose veins of bilateral lower extremities with pain: Secondary | ICD-10-CM

## 2022-04-06 DIAGNOSIS — I890 Lymphedema, not elsewhere classified: Secondary | ICD-10-CM

## 2022-04-06 DIAGNOSIS — I872 Venous insufficiency (chronic) (peripheral): Secondary | ICD-10-CM | POA: Diagnosis not present

## 2022-04-07 ENCOUNTER — Encounter (INDEPENDENT_AMBULATORY_CARE_PROVIDER_SITE_OTHER): Payer: Self-pay | Admitting: Vascular Surgery

## 2022-05-08 ENCOUNTER — Ambulatory Visit: Payer: BC Managed Care – PPO | Admitting: Family Medicine

## 2022-05-22 ENCOUNTER — Telehealth (INDEPENDENT_AMBULATORY_CARE_PROVIDER_SITE_OTHER): Payer: Self-pay | Admitting: Vascular Surgery

## 2022-05-22 NOTE — Telephone Encounter (Signed)
Spoke with pt regarding laser ablation. I explained the difficult situation we were in with the back ordered supplies . Moving etc. I advised patient that she is on my list and that I ma moving as quickly as I can. Pt acknowledged and I assured her that I would be in touch as soon as I possibly can.

## 2022-06-23 ENCOUNTER — Other Ambulatory Visit: Payer: Self-pay | Admitting: Family Medicine

## 2022-06-23 ENCOUNTER — Ambulatory Visit: Payer: BC Managed Care – PPO | Admitting: Physician Assistant

## 2022-06-23 ENCOUNTER — Ambulatory Visit: Payer: BC Managed Care – PPO | Admitting: Family Medicine

## 2022-06-23 VITALS — BP 128/90 | HR 83 | Temp 97.8°F | Resp 12 | Ht 65.0 in | Wt 273.0 lb

## 2022-06-23 DIAGNOSIS — Z6841 Body Mass Index (BMI) 40.0 and over, adult: Secondary | ICD-10-CM | POA: Diagnosis not present

## 2022-06-23 DIAGNOSIS — J020 Streptococcal pharyngitis: Secondary | ICD-10-CM

## 2022-06-23 LAB — POCT RAPID STREP A (OFFICE): Rapid Strep A Screen: NEGATIVE

## 2022-06-23 MED ORDER — PHENTERMINE HCL 37.5 MG PO CAPS: 37.5000 mg | ORAL_CAPSULE | ORAL | 0 refills | Status: DC

## 2022-06-23 NOTE — Progress Notes (Signed)
I,Sulibeya S Dimas,acting as a Neurosurgeon for [employer], DO.,have documented all relevant documentation on the behalf of [employer], DO,as directed by  [employer], DO while in the presence of Sherlyn Hay, DO.     Established patient visit   Patient: Haley Washington   DOB: [birth year]   37 y.o. Female  MRN: [id number] Visit Date: 06/23/2022  Today's healthcare provider: Sherlyn Hay, DO   Chief Complaint  Patient presents with   Sore Throat   Subjective    HPI  Upper respiratory symptoms She complains of post nasal drip, sore throat, and bringing up yellow phlegm .with no fever, chills, night sweats or weight loss. Onset of symptoms was yesterday and staying constant.She is drinking plenty of fluids.  Past history is significant for no history of pneumonia or bronchitis. Patient is non-smoker  She has not taken any over-the-counter medications at this point, as her symptoms started last night. She was concerned her strep had recurred again and wanted to be seen to address it quickly. She describes her sore throat as mild at this time. - No known sick contacts - Has two children - a 75-year old and a 59-year old at home; they do not seem to have any symptoms.  Patient endorses that she has not exercised regularly for several years but that she and her husband were discussing going to a gym and getting membership today.  ---------------------------------------------------------------------------------------------------   Medications: Outpatient Medications Prior to Visit  Medication Sig   Azelastine-Fluticasone 137-50 MCG/ACT SUSP 1 spray in each nostril Nasally Twice a day for 30 days   EPINEPHrine 0.3 mg/0.3 mL IJ SOAJ injection Inject into the muscle.   levonorgestrel (MIRENA, 52 MG,) 20 MCG/DAY IUD Device provided by care center   montelukast (SINGULAIR) 10 MG tablet 1 tablet Orally Once a day for 30 days   [DISCONTINUED] amoxicillin-clavulanate (AUGMENTIN) 875-125  MG tablet Take 1 tablet by mouth 2 (two) times daily. (Patient not taking: Reported on 04/06/2022)   [DISCONTINUED] phentermine 15 MG capsule Take 1 capsule (15 mg total) by mouth every morning. (Patient not taking: Reported on 04/06/2022)   No facility-administered medications prior to visit.    Review of Systems  Constitutional:  Positive for fatigue. Negative for chills and fever.  HENT:  Positive for postnasal drip, rhinorrhea and sore throat. Negative for ear pain, sinus pressure and trouble swallowing.   Respiratory:  Negative for cough, chest tightness, shortness of breath and wheezing.   Gastrointestinal:  Negative for abdominal pain, constipation, diarrhea, nausea and vomiting.       Objective    BP (!) 128/90 (BP Location: Right Arm, Patient Position: Sitting, Cuff Size: Large)   Pulse 83   Temp 97.8 F (36.6 C) (Temporal)   Resp 12   Ht 5\' 5"  (1.651 m)   Wt 273 lb (123.8 kg)   SpO2 99%   BMI 45.43 kg/m  BP Readings from Last 3 Encounters:  06/23/22 (!) 128/90  04/06/22 128/86  03/26/22 114/83   Wt Readings from Last 3 Encounters:  06/23/22 273 lb (123.8 kg)  04/06/22 269 lb 3.2 oz (122.1 kg)  03/26/22 268 lb (121.6 kg)      Physical Exam Vitals reviewed.  Constitutional:      General: She is not in acute distress.    Appearance: Normal appearance. She is well-developed. She is obese. She is not diaphoretic.  HENT:     Head: Normocephalic and atraumatic.  Right Ear: Tympanic membrane and ear canal normal.     Left Ear: Tympanic membrane and ear canal normal.     Nose: Mucosal edema, congestion and rhinorrhea present. Rhinorrhea is purulent.     Right Turbinates: Swollen.     Left Turbinates: Swollen.     Right Sinus: No maxillary sinus tenderness or frontal sinus tenderness.     Left Sinus: No maxillary sinus tenderness or frontal sinus tenderness.     Mouth/Throat:     Mouth: Mucous membranes are moist. No oral lesions.     Pharynx: Oropharynx is  clear. Uvula midline. No pharyngeal swelling, oropharyngeal exudate, posterior oropharyngeal erythema or uvula swelling.     Tonsils: No tonsillar exudate or tonsillar abscesses. 2+ on the right. 2+ on the left.  Eyes:     General: No scleral icterus.    Conjunctiva/sclera: Conjunctivae normal.  Neck:     Thyroid: No thyromegaly.  Cardiovascular:     Rate and Rhythm: Normal rate and regular rhythm.     Pulses: Normal pulses.     Heart sounds: Normal heart sounds. No murmur heard. Pulmonary:     Effort: Pulmonary effort is normal. No respiratory distress.     Breath sounds: Normal breath sounds. No wheezing, rhonchi or rales.  Musculoskeletal:     Cervical back: Neck supple.     Right lower leg: No edema.     Left lower leg: No edema.  Lymphadenopathy:     Cervical: No cervical adenopathy.  Skin:    General: Skin is warm and dry.     Findings: No rash.  Neurological:     Mental Status: She is alert and oriented to person, place, and time. Mental status is at baseline.  Psychiatric:        Mood and Affect: Mood normal.        Behavior: Behavior normal.      No results found for any visits on 06/23/22.  Assessment & Plan    1. Strep pharyngitis Patient has history of recurrent streptococcal pharyngitis.  Her point-of-care rapid strep was negative today.  However, patient's symptoms just started last night, so it is possible this is a false negative.  Will send a throat culture.  Will not send antibiotics at this time as patient currently describes her symptoms as mild and her rapid strep was negative.  Encouraged her to use a mask when around other people, as this is contagious whether viral or bacterial.  Encouraged her to continue supportive treatment with OTC analgesics, frequent hydration, continued intake of healthy calories, and plenty of rest.  If her symptoms worsen, given her history of frequent, recurrent strep, will send in cefdinir 300 mg twice daily for 5 days.   - POCT  rapid strep A - Culture, Group A Strep  2. Morbid obesity with BMI of 45.0-49.9, adult [facility]) Patient was previously prescribed medication to facilitate weight loss but was unable to get it due to insurance denial.  Discussed out-of-pocket cost with patient, using online coupons available and patient decided they were within reach.  Will send phentermine as noted below.    - Advised patient to continue with diet and exercise as these will still be important in achieving weight loss.  Urged her to engage in at least 150 minutes of exercise spread across the week and to do something that she enjoys so that it is more likely she will return to it.  I also encouraged her to measure/weigh their foods/beverages for  a few days to get a more accurate idea of what different amounts of things look like on her plate/in her glass, as well as to use smaller dishes as this can check the mind into thinking the food quantity on the plate is larger than it may actually be.  - Patient understands not to start the phentermine until she recovers from her current illness.  Patient to return 3-4 weeks after starting for a weight check and discussion of how her efforts are going. - phentermine 37.5 MG capsule; Take 1 capsule (37.5 mg total) by mouth every morning.  Dispense: 30 capsule; Refill: 0   No follow-ups on file.      The entirety of the information documented in the History of Present Illness, Review of Systems and Physical Exam were personally obtained by me. Portions of this information were initially documented by the CMA, Hyacinth Meeker, and reviewed by me for thoroughness and accuracy.   I discussed the assessment and treatment plan with the patient  The patient was provided an opportunity to ask questions and all were answered. The patient agreed with the plan and demonstrated an understanding of the instructions.   The patient was advised to call back or seek an in-person evaluation if the symptoms worsen  or if the condition fails to improve as anticipated.    Sherlyn Hay, DO  [facility] [phone] (phone) [phone] (fax)  [facility]

## 2022-06-26 LAB — CULTURE, GROUP A STREP: Strep A Culture: NEGATIVE

## 2022-07-20 NOTE — Progress Notes (Unsigned)
      Established patient visit   Patient: Haley Washington   DOB: [birth year]   37 y.o. Female  MRN: [id number] Visit Date: 07/21/2022  Today's healthcare provider: Ronnald Ramp, MD   No chief complaint on file.  Subjective      Haley Washington presents for weight management follow up for obesity Patient has been on *** and reports tolerating medication well  Starting weight ***  Weight today is ***  Goal weight is ***   Dietary adjustments include ***  Physical activity includes ***     Medications: Outpatient Medications Prior to Visit  Medication Sig   Azelastine-Fluticasone 137-50 MCG/ACT SUSP 1 spray in each nostril Nasally Twice a day for 30 days   EPINEPHrine 0.3 mg/0.3 mL IJ SOAJ injection Inject into the muscle.   levonorgestrel (MIRENA, 52 MG,) 20 MCG/DAY IUD Device provided by care center   montelukast (SINGULAIR) 10 MG tablet 1 tablet Orally Once a day for 30 days   phentermine 37.5 MG capsule Take 1 capsule (37.5 mg total) by mouth every morning.   No facility-administered medications prior to visit.    Review of Systems  {Labs  Heme  Chem  Endocrine  Serology  Results Review (optional):23779}   Objective    There were no vitals taken for this visit. {Show previous vital signs (optional):23777}  Physical Exam  ***  No results found for any visits on 07/21/22.  Assessment & Plan     Problem List Items Addressed This Visit   None    No follow-ups on file.         The entirety of the information documented in the History of Present Illness, Review of Systems and Physical Exam were personally obtained by me. Portions of this information were initially documented by *** . I, Ronnald Ramp, MD have reviewed the documentation above for thoroughness and accuracy.     Ronnald Ramp, MD  [facility] [phone] (phone) [phone] (fax)  [facility]

## 2022-07-21 ENCOUNTER — Encounter: Payer: Self-pay | Admitting: Family Medicine

## 2022-07-21 ENCOUNTER — Ambulatory Visit: Payer: BC Managed Care – PPO | Admitting: Family Medicine

## 2022-07-21 VITALS — BP 119/82 | HR 73 | Ht 65.0 in | Wt 276.9 lb

## 2022-07-21 DIAGNOSIS — Z6841 Body Mass Index (BMI) 40.0 and over, adult: Secondary | ICD-10-CM

## 2022-07-21 MED ORDER — PHENTERMINE HCL 37.5 MG PO CAPS
37.5000 mg | ORAL_CAPSULE | ORAL | 0 refills | Status: DC
Start: 2022-07-22 — End: 2022-08-28

## 2022-07-21 MED ORDER — PHENTERMINE HCL 37.5 MG PO CAPS
37.5000 mg | ORAL_CAPSULE | ORAL | 0 refills | Status: DC
Start: 2022-08-21 — End: 2022-08-28

## 2022-07-21 NOTE — Assessment & Plan Note (Signed)
Chronic BMI 46.08 Patient will continue 2 additional months of phentermine 37.5 mg once daily Refills provided We discussed options for adding topiramate to phentermine or referral to bariatric surgery Patient is open to bariatric surgery referral once we have completed 3 months trial of diet exercise and phentermine Follow-up in 1 month for weight management

## 2022-08-03 ENCOUNTER — Other Ambulatory Visit: Payer: Self-pay | Admitting: Family Medicine

## 2022-08-03 ENCOUNTER — Encounter: Payer: Self-pay | Admitting: Family Medicine

## 2022-08-03 DIAGNOSIS — M7989 Other specified soft tissue disorders: Secondary | ICD-10-CM

## 2022-08-03 NOTE — Progress Notes (Signed)
am

## 2022-08-25 ENCOUNTER — Ambulatory Visit: Payer: BC Managed Care – PPO | Admitting: Family Medicine

## 2022-08-28 ENCOUNTER — Encounter: Payer: Self-pay | Admitting: Physician Assistant

## 2022-08-28 ENCOUNTER — Ambulatory Visit: Payer: BC Managed Care – PPO | Admitting: Physician Assistant

## 2022-08-28 VITALS — BP 121/94 | HR 84 | Temp 98.2°F | Ht 65.0 in | Wt 271.2 lb

## 2022-08-28 DIAGNOSIS — U071 COVID-19: Secondary | ICD-10-CM

## 2022-08-28 MED ORDER — MOLNUPIRAVIR EUA 200MG CAPSULE
4.0000 | ORAL_CAPSULE | Freq: Two times a day (BID) | ORAL | 0 refills | Status: DC
Start: 2022-08-28 — End: 2022-08-29

## 2022-08-28 MED ORDER — NIRMATRELVIR/RITONAVIR (PAXLOVID)TABLET
3.0000 | ORAL_TABLET | Freq: Two times a day (BID) | ORAL | 0 refills | Status: AC
Start: 2022-08-28 — End: 2022-09-02

## 2022-08-28 NOTE — Progress Notes (Signed)
Established patient visit  Patient: Haley Washington   DOB: 1985/08/02   37 y.o. Female  MRN: [id number] Visit Date: 08/28/2022  Today's healthcare provider: Debera Lat, PA-C   Chief Complaint  Patient presents with  . Covid Positive    Symptoms started Wednesday morning/Tuesday night, symptoms include congestion, runny nose, fatigue, prior treatment include OTC suddifed   Subjective     Discussed the use of AI scribe software for clinical note transcription with the patient, who gave verbal consent to proceed.  History of Present Illness               07/21/2022    2:44 PM 06/23/2022    1:41 PM 03/26/2022   10:10 AM  Depression screen PHQ 2/9  Decreased Interest 1 0 0  Down, Depressed, Hopeless 0 0 0  PHQ - 2 Score 1 0 0  Altered sleeping 0 0 1  Tired, decreased energy 1 1 1   Change in appetite 0 0 0  Feeling bad or failure about yourself  0 0 0  Trouble concentrating 0 0 0  Moving slowly or fidgety/restless 0 0 0  Suicidal thoughts 0 0 0  PHQ-9 Score 2 1 2   Difficult doing work/chores Not difficult at all Not difficult at all Not difficult at all       No data to display          Medications: Outpatient Medications Prior to Visit  Medication Sig  . Azelastine-Fluticasone 137-50 MCG/ACT SUSP 1 spray in each nostril Nasally Twice a day for 30 days  . EPINEPHrine 0.3 mg/0.3 mL IJ SOAJ injection Inject into the muscle.  . levonorgestrel (MIRENA, 52 MG,) 20 MCG/DAY IUD Device provided by care center  . montelukast (SINGULAIR) 10 MG tablet 1 tablet Orally Once a day for 30 days  . phentermine 37.5 MG capsule Take 1 capsule (37.5 mg total) by mouth every morning.  . [DISCONTINUED] phentermine 37.5 MG capsule Take 1 capsule (37.5 mg total) by mouth every morning.  . [DISCONTINUED] phentermine 37.5 MG capsule Take 1 capsule (37.5 mg total) by mouth every morning.   No facility-administered medications prior to visit.    Review of Systems Except see HPI   {Insert  previous labs (optional):23779} {See past labs  Heme  Chem  Endocrine  Serology  Results Review (optional):1}   Objective    BP (!) 121/94 (BP Location: Left Arm, Patient Position: Sitting, Cuff Size: Large)   Pulse 84   Temp 98.2 F (36.8 C) (Oral)   Ht 5\' 5"  (1.651 m)   Wt 271 lb 3.2 oz (123 kg)   SpO2 100%   BMI 45.13 kg/m  {Insert last BP/Wt (optional):23777}{See vitals history (optional):1}   Physical Exam   No results found for any visits on 08/28/22.  Assessment & Plan    *** Assessment and Plan           Asymptomatic or mildly symptomatic patients without immunocompromise should isolate for at least 5 days from the day of testing or symptom onset. If symptoms develop during these 5 days, the isolation period should restart with the first day of symptoms as day 0. After isolation, they should wear a mask for an additional 5 days.[1][4] . Patients with moderate symptoms, such as shortness of breath, should isolate for at least 10 days.[1][4] . Patients with severe or critical illness, or those who are immunocompromised, should isolate for at least 10 days and up to 20 days since symptom onset. They  should also be fever-free for at least 24 hours without the use of antipyretics and have improved symptoms before discontinuing isolation.[1-  The recommendations suggest returning to normal activities when, for at least 24 hours, symptoms are improving overall, and if a fever was present, it has been gone without use of a fever-reducing medication.  Once people resume normal activities, they are encouraged to take additional prevention strategies for the next 5 days to curb disease spread, such as taking more steps for cleaner air, enhancing hygiene practices, wearing a well-fitting mask, keeping a distance from others, and/or getting tested for respiratory viruses. Enhanced precautions are especially important to protect those most at risk for severe illness, including those  over 65 and people with weakened immune system  No follow-ups on file.      [facility]

## 2022-08-29 NOTE — Patient Instructions (Signed)
Isolation instructions for patient Asymptomatic or mildly symptomatic patients without immunocompromise should isolate for at least 5 days from the day of testing or symptom onset. If symptoms develop during these 5 days, the isolation period should restart with the first day of symptoms as day 0. After isolation, they should wear a mask for an additional 5 days  The recommendations suggest returning to normal activities when, for at least 24 hours, symptoms are improving overall, and if a fever was present, it has been gone without use of a fever-reducing medication.  Once people resume normal activities, they are encouraged to take additional prevention strategies for the next 5 days to curb disease spread, such as taking more steps for cleaner air, enhancing hygiene practices, wearing a well-fitting mask, keeping a distance from others, and/or getting tested for respiratory viruses.

## 2022-09-07 NOTE — Progress Notes (Unsigned)
      Established patient visit   Patient: Haley Washington   DOB: 12/07/1985   37 y.o. Female  MRN: [id number] Visit Date: 09/08/2022  Today's healthcare provider: Ronnald Ramp, MD   No chief complaint on file.  Subjective       Discussed the use of AI scribe software for clinical note transcription with the patient, who gave verbal consent to proceed.  History of Present Illness             Medications: Outpatient Medications Prior to Visit  Medication Sig   Azelastine-Fluticasone 137-50 MCG/ACT SUSP 1 spray in each nostril Nasally Twice a day for 30 days   EPINEPHrine 0.3 mg/0.3 mL IJ SOAJ injection Inject into the muscle.   levonorgestrel (MIRENA, 52 MG,) 20 MCG/DAY IUD Device provided by care center   montelukast (SINGULAIR) 10 MG tablet 1 tablet Orally Once a day for 30 days   phentermine 37.5 MG capsule Take 1 capsule (37.5 mg total) by mouth every morning.   No facility-administered medications prior to visit.    Review of Systems  {Insert previous labs (optional):23779} {See past labs  Heme  Chem  Endocrine  Serology  Results Review (optional):1}   Objective    There were no vitals taken for this visit. {Insert last BP/Wt (optional):23777}{See vitals history (optional):1}   Physical Exam  ***  No results found for any visits on 09/08/22.  Assessment & Plan     Problem List Items Addressed This Visit   None   Assessment and Plan              No follow-ups on file.         Ronnald Ramp, MD  [facility] [phone] (phone) [phone] (fax)  [facility]

## 2022-09-08 ENCOUNTER — Ambulatory Visit: Payer: BC Managed Care – PPO | Admitting: Family Medicine

## 2022-09-08 ENCOUNTER — Ambulatory Visit
Admission: RE | Admit: 2022-09-08 | Discharge: 2022-09-08 | Disposition: A | Discharge status: Home / Self Care | Payer: BC Managed Care – PPO | Attending: Family Medicine | Admitting: Family Medicine

## 2022-09-08 ENCOUNTER — Ambulatory Visit
Admission: RE | Admit: 2022-09-08 | Discharge: 2022-09-08 | Disposition: A | Discharge status: Home / Self Care | Payer: BC Managed Care – PPO | Source: Ambulatory Visit | Attending: Family Medicine | Admitting: Family Medicine

## 2022-09-08 ENCOUNTER — Encounter: Payer: Self-pay | Admitting: Family Medicine

## 2022-09-08 VITALS — BP 113/82 | HR 85 | Temp 98.1°F | Ht 65.0 in | Wt 271.0 lb

## 2022-09-08 DIAGNOSIS — Z6841 Body Mass Index (BMI) 40.0 and over, adult: Secondary | ICD-10-CM

## 2022-09-08 DIAGNOSIS — R059 Cough, unspecified: Secondary | ICD-10-CM

## 2022-09-08 DIAGNOSIS — G9339 Other post infection and related fatigue syndromes: Secondary | ICD-10-CM | POA: Diagnosis not present

## 2022-09-08 DIAGNOSIS — R300 Dysuria: Secondary | ICD-10-CM | POA: Diagnosis not present

## 2022-09-08 LAB — POCT URINALYSIS DIPSTICK
Bilirubin, UA: NEGATIVE
Glucose, UA: NEGATIVE
Ketones, UA: NEGATIVE
Leukocytes, UA: NEGATIVE
Nitrite, UA: NEGATIVE
Protein, UA: NEGATIVE
Spec Grav, UA: 1.02 (ref 1.010–1.025)
Urobilinogen, UA: 0.2 E.U./dL
pH, UA: 6.5 (ref 5.0–8.0)

## 2022-09-08 LAB — POCT URINE PREGNANCY: Preg Test, Ur: NEGATIVE

## 2022-09-08 MED ORDER — CEPHALEXIN 500 MG PO CAPS: 500.0000 mg | ORAL_CAPSULE | Freq: Three times a day (TID) | ORAL | 0 refills | Status: AC

## 2022-09-08 MED ORDER — PREDNISONE 20 MG PO TABS: 20.0000 mg | ORAL_TABLET | Freq: Every day | ORAL | 0 refills | Status: AC

## 2022-09-08 MED ORDER — BENZONATATE 200 MG PO CAPS: 200.0000 mg | ORAL_CAPSULE | Freq: Two times a day (BID) | ORAL | 0 refills | Status: DC | PRN

## 2022-09-08 NOTE — Assessment & Plan Note (Signed)
Weight decreased from 276 to 271 pounds. Patient was on phentermine but stopped due to COVID-19 illness.chronic problem -Resume phentermine if tolerated and no adverse symptoms such as diarrhea or vomiting. -Encourage physical activity as tolerated, avoiding overexertion while recovering from COVID-19.

## 2022-09-08 NOTE — Addendum Note (Signed)
Addended by: Bing Neighbors on: 09/08/2022 04:05 PM   Modules accepted: Orders

## 2022-09-08 NOTE — Patient Instructions (Addendum)
VISIT SUMMARY:  During our visit, we discussed your ongoing respiratory symptoms following your recent COVID-19 diagnosis, your symptoms suggestive of a urinary tract infection (UTI), and your weight management. We have outlined a plan to address each of these concerns.   Please report to [facility] located at:  [address]  [place], [place] [address]  You do not need an appointment to have xrays completed.   Our office will follow up with  results once available.    YOUR PLAN:  -POST-ACUTE COVID-19 SYNDROME: This refers to your persistent cough and shortness of breath two weeks after your positive COVID-19 diagnosis. We will order a chest x-ray to rule out pneumonia or bronchitis, and I have prescribed Tessalon Perles to help with your cough. I also recommend natural cough suppressants like tea and honey.  -POSSIBLE URINARY TRACT INFECTION: Your frequent urination, burning with urination, and lower back pain suggest a possible UTI. We will collect a urine sample for testing and also check a pregnancy test due to your Mirena IUD and abdominal tenderness.  -OBESITY: You have lost some weight, which is great. We will resume your phentermine medication if you can tolerate it and don't have adverse symptoms like diarrhea or vomiting. I encourage you to continue with physical activity as much as you can, but avoid overexertion while you're recovering from COVID-19.  INSTRUCTIONS:  Please follow the prescribed treatment plan and monitor your symptoms closely. If your symptoms worsen or you experience any side effects from the medication, please contact the office immediately. We will also need to schedule a follow-up appointment to review the results of your tests and assess your progress.

## 2022-09-08 NOTE — Assessment & Plan Note (Signed)
Persistent cough and shortness of breath two weeks after positive COVID-19 diagnosis. No wheezing or crackles on exam. Oxygen saturation was 98%. -Order chest x-ray to rule out pneumonia or bronchitis. -Prescribe Tessalon Perles 200mg  for cough as needed. -Advise on natural cough suppressants such as tea and honey.

## 2022-09-10 ENCOUNTER — Encounter: Payer: Self-pay | Admitting: Family Medicine

## 2022-09-10 LAB — URINE CULTURE

## 2022-09-16 NOTE — Progress Notes (Unsigned)
    MRN : [id number]  Haley Washington is a 37 y.o. ([birth year]) female who presents with chief complaint of No chief complaint on file. .    The patient's left lower extremity was sterilely prepped and draped.  The ultrasound machine was used to visualize the left great saphenous vein throughout its course.  A segment in the mid thigh was selected for access.  The saphenous vein was accessed without difficulty using ultrasound guidance with a micropuncture needle.   An 0.018  wire was placed beyond the saphenofemoral junction through the sheath and the microneedle was removed.  The 65 cm sheath was then placed over the wire and the wire and dilator were removed.  The laser fiber was placed through the sheath and its tip was placed approximately 2 cm below the saphenofemoral junction.  Tumescent anesthesia was then created with a dilute lidocaine solution.  Laser energy was then delivered with constant withdrawal of the sheath and laser fiber.  Approximately 967 Joules of energy were delivered over a length of 19 cm.  Sterile dressings were placed.  The patient tolerated the procedure well without complications.

## 2022-09-17 ENCOUNTER — Ambulatory Visit (INDEPENDENT_AMBULATORY_CARE_PROVIDER_SITE_OTHER): Payer: BC Managed Care – PPO | Admitting: Vascular Surgery

## 2022-09-17 ENCOUNTER — Encounter (INDEPENDENT_AMBULATORY_CARE_PROVIDER_SITE_OTHER): Payer: Self-pay | Admitting: Vascular Surgery

## 2022-09-17 VITALS — BP 121/85 | HR 87 | Resp 16 | Wt 270.6 lb

## 2022-09-17 DIAGNOSIS — I83813 Varicose veins of bilateral lower extremities with pain: Secondary | ICD-10-CM | POA: Diagnosis not present

## 2022-09-23 ENCOUNTER — Other Ambulatory Visit (INDEPENDENT_AMBULATORY_CARE_PROVIDER_SITE_OTHER): Payer: Self-pay | Admitting: Vascular Surgery

## 2022-09-23 DIAGNOSIS — I83813 Varicose veins of bilateral lower extremities with pain: Secondary | ICD-10-CM

## 2022-09-24 ENCOUNTER — Ambulatory Visit (INDEPENDENT_AMBULATORY_CARE_PROVIDER_SITE_OTHER): Payer: BC Managed Care – PPO

## 2022-09-24 DIAGNOSIS — I83813 Varicose veins of bilateral lower extremities with pain: Secondary | ICD-10-CM

## 2022-09-29 ENCOUNTER — Encounter: Payer: Self-pay | Admitting: Family Medicine

## 2022-09-29 ENCOUNTER — Ambulatory Visit: Payer: BC Managed Care – PPO | Admitting: Family Medicine

## 2022-09-29 VITALS — BP 128/89 | HR 81 | Temp 97.5°F | Ht 65.0 in | Wt 273.3 lb

## 2022-09-29 DIAGNOSIS — Z6841 Body Mass Index (BMI) 40.0 and over, adult: Secondary | ICD-10-CM

## 2022-09-29 DIAGNOSIS — I83813 Varicose veins of bilateral lower extremities with pain: Secondary | ICD-10-CM

## 2022-09-29 MED ORDER — PHENTERMINE HCL 37.5 MG PO CAPS
37.5000 mg | ORAL_CAPSULE | ORAL | 0 refills | Status: DC
Start: 2022-09-29 — End: 2022-11-03

## 2022-09-29 NOTE — Progress Notes (Unsigned)
Established patient visit   Patient: Haley Washington   DOB: 1985-03-14   37 y.o. Female  MRN: [id number] Visit Date: 09/29/2022  Today's healthcare provider: Ronnald Ramp, MD   No chief complaint on file.  Subjective       Discussed the use of AI scribe software for clinical note transcription with the patient, who gave verbal consent to proceed.  History of Present Illness   The patient, Haley Washington, presents for a follow-up consultation regarding weight management. She reports feeling more normal and have resumed taking phentermine. She has also undergone her first laser ablation for varicose veins in one leg, which has resulted in significant swelling and discomfort. The patient is scheduled for a second ablation in the other leg in the near future.  The patient has been adhering to the prescribed phentermine regimen, noting increased energy levels and decreased appetite, although she does not believe it has significantly impacted their weight. She express concern about potential energy level drops upon discontinuation of the medication.  In addition to their weight management concerns, the patient reports a recent bug bite that has resulted in unusual swelling. She also mention a recent incident where their daughter sat on an ant mound, resulting in multiple bites.  The patient's weight has fluctuated slightly over the course of their treatment, and she express a desire to explore other options for weight management. She are open to the idea of joining a weight management program, such as Weight Watchers, and are also considering options available through their employer, [employer]. She are scheduled for a follow-up consultation in six weeks.         Past Medical History:  Diagnosis Date   Acne    has been on aldactone in the past.  Also tried Accutane without improvement in her symptoms    Allergy    History of chicken pox    Hx: UTI (urinary tract infection)     Hyperlipidemia    Low back pain 2017   improved with physical therapy   Morbid obesity with BMI of 40.0-44.9, adult (HCC)     Medications: Outpatient Medications Prior to Visit  Medication Sig   Azelastine-Fluticasone 137-50 MCG/ACT SUSP 1 spray in each nostril Nasally Twice a day for 30 days   EPINEPHrine 0.3 mg/0.3 mL IJ SOAJ injection Inject into the muscle.   levonorgestrel (MIRENA, 52 MG,) 20 MCG/DAY IUD Device provided by care center   montelukast (SINGULAIR) 10 MG tablet 1 tablet Orally Once a day for 30 days   [DISCONTINUED] phentermine 37.5 MG capsule Take 1 capsule (37.5 mg total) by mouth every morning.   benzonatate (TESSALON) 200 MG capsule Take 1 capsule (200 mg total) by mouth 2 (two) times daily as needed for cough. (Patient not taking: Reported on 09/29/2022)   No facility-administered medications prior to visit.    Review of Systems      Objective    BP 128/89 (BP Location: Right Arm, Patient Position: Sitting, Cuff Size: Large)   Pulse 81   Temp (!) 97.5 F (36.4 C) (Oral)   Ht 5\' 5"  (1.651 m)   Wt 273 lb 4.8 oz (124 kg)   SpO2 99%   BMI 45.48 kg/m  BP Readings from Last 3 Encounters:  09/29/22 128/89  09/17/22 121/85  09/08/22 113/82   Wt Readings from Last 3 Encounters:  09/29/22 273 lb 4.8 oz (124 kg)  09/17/22 270 lb 9.6 oz (122.7 kg)  09/08/22 271 lb (122.9  kg)       Physical Exam Vitals reviewed.  Constitutional:      General: She is not in acute distress.    Appearance: Normal appearance. She is obese. She is not ill-appearing, toxic-appearing or diaphoretic.  Eyes:     Conjunctiva/sclera: Conjunctivae normal.  Cardiovascular:     Rate and Rhythm: Normal rate and regular rhythm.     Pulses: Normal pulses.     Heart sounds: Normal heart sounds. No murmur heard.    No friction rub. No gallop.  Pulmonary:     Effort: Pulmonary effort is normal. No respiratory distress.     Breath sounds: Normal breath sounds. No stridor. No wheezing,  rhonchi or rales.  Abdominal:     General: Bowel sounds are normal. There is no distension.     Palpations: Abdomen is soft.     Tenderness: There is no abdominal tenderness.  Musculoskeletal:     Right lower leg: No edema.     Left lower leg: No edema.  Skin:    Findings: Erythema present. No rash.     Comments: Area of swelling on left thigh at site of laser treatment, also erythematous area on left shin associated with area of induration without bleeding nor drainage visualized today   Neurological:     Mental Status: She is alert and oriented to person, place, and time.       No results found for any visits on 09/29/22.  Assessment & Plan     Problem List Items Addressed This Visit     Morbid obesity with BMI of 45.0-49.9, adult (HCC) - Primary    Stable weight despite use of Phentermine 37.5mg  daily. Limited physical activity due to recent varicose vein treatment. Patient reports increased energy and decreased appetite with Phentermine. -Continue Phentermine 37.5mg  daily for another month. -Recommended that pt start exploring other weight management programs such as Weight Watchers or [facility]'s program. -Plan to taper off Phentermine after current prescription ends.      Relevant Medications   phentermine 37.5 MG capsule   Varicose veins of bilateral lower extremities with pain    Recent laser ablation with subsequent swelling and discomfort. No signs of infection or deep vein thrombosis. -Continue with post-procedure care as instructed by specialist. -Contact specialist regarding new swelling and discomfort. -Attend scheduled follow-up appointments.        Bug Bite Recent bug bite with localized swelling and discomfort. No signs of infection. -Apply topical Benadryl or cortisone cream for itch relief. -Use a heat pad to reduce swelling. -Keep area clean with alcohol.  Follow-up in 6 weeks on November 04, 2022.       Ronnald Ramp, MD  [facility] [phone] (phone) [phone] (fax)  [facility]

## 2022-09-30 ENCOUNTER — Telehealth: Payer: Self-pay | Admitting: Family Medicine

## 2022-09-30 NOTE — Assessment & Plan Note (Signed)
Stable weight despite use of Phentermine 37.5mg  daily. Limited physical activity due to recent varicose vein treatment. Patient reports increased energy and decreased appetite with Phentermine. -Continue Phentermine 37.5mg  daily for another month. -Recommended that pt start exploring other weight management programs such as Weight Watchers or [facility]'s program. -Plan to taper off Phentermine after current prescription ends.

## 2022-09-30 NOTE — Telephone Encounter (Signed)
Received a fax from covermymeds for phentermine [facility] 37.5mg   Key: [id number]

## 2022-09-30 NOTE — Assessment & Plan Note (Signed)
Recent laser ablation with subsequent swelling and discomfort. No signs of infection or deep vein thrombosis. -Continue with post-procedure care as instructed by specialist. -Contact specialist regarding new swelling and discomfort. -Attend scheduled follow-up appointments.

## 2022-10-02 NOTE — Telephone Encounter (Signed)
Outcome Denied today by [facility] NCPDP 2017 Your PA request has been denied. Additional information will be provided in the denial communication. (Message 1140) Drug Phentermine HCl 37.5MG  capsules

## 2022-10-02 NOTE — Telephone Encounter (Signed)
PA initiated

## 2022-10-07 ENCOUNTER — Encounter: Payer: Self-pay | Admitting: Family Medicine

## 2022-10-08 ENCOUNTER — Other Ambulatory Visit: Payer: Self-pay | Admitting: Family Medicine

## 2022-10-08 NOTE — Progress Notes (Signed)
Referral for bariatric surgery requested  Unable to lose significant weight on phentermine

## 2022-10-15 ENCOUNTER — Ambulatory Visit (INDEPENDENT_AMBULATORY_CARE_PROVIDER_SITE_OTHER): Payer: BC Managed Care – PPO | Admitting: Nurse Practitioner

## 2022-10-29 ENCOUNTER — Other Ambulatory Visit (INDEPENDENT_AMBULATORY_CARE_PROVIDER_SITE_OTHER): Payer: BC Managed Care – PPO | Admitting: Vascular Surgery

## 2022-11-03 ENCOUNTER — Ambulatory Visit: Payer: BC Managed Care – PPO | Admitting: Family Medicine

## 2022-11-03 ENCOUNTER — Telehealth: Payer: Self-pay | Admitting: Family Medicine

## 2022-11-03 ENCOUNTER — Other Ambulatory Visit: Payer: Self-pay | Admitting: Family Medicine

## 2022-11-03 VITALS — BP 124/59 | HR 84 | Temp 97.6°F | Resp 14 | Ht 65.0 in | Wt 271.4 lb

## 2022-11-03 DIAGNOSIS — L700 Acne vulgaris: Secondary | ICD-10-CM

## 2022-11-03 DIAGNOSIS — I872 Venous insufficiency (chronic) (peripheral): Secondary | ICD-10-CM | POA: Diagnosis not present

## 2022-11-03 DIAGNOSIS — Z23 Encounter for immunization: Secondary | ICD-10-CM | POA: Diagnosis not present

## 2022-11-03 DIAGNOSIS — Z6841 Body Mass Index (BMI) 40.0 and over, adult: Secondary | ICD-10-CM

## 2022-11-03 MED ORDER — PHENTERMINE HCL 15 MG PO CAPS
ORAL_CAPSULE | ORAL | 0 refills | Status: DC
Start: 2022-11-03 — End: 2022-11-09

## 2022-11-03 NOTE — Patient Instructions (Signed)
VISIT SUMMARY:  During your visit, we discussed your ongoing struggle with obesity and the minimal weight loss you've experienced despite taking Phentermine. We also discussed your upcoming bariatric surgery, the delay in your lower extremity ablation due to the surgeon's illness, and your concerns about a persistent bug bite and acne flare-ups. You received a flu shot today and we scheduled a full blood panel for next month.  YOUR PLAN:  -OBESITY: We will gradually reduce your Phentermine dosage over the next month and continue with the bariatric surgery referral process. Obesity is a condition where excess body fat has accumulated to an extent that it may have a negative effect on health.  -ACNE: We will refer you to a dermatologist for further evaluation and management of your acne flare-ups. Acne is a skin condition that occurs when your hair follicles become plugged with oil and dead skin cells.  -INSECT BITE: You should apply heat to the bug bite on your leg for 15-20 minutes daily to help reduce the inflammation. If there's no improvement in a month, we may consider further intervention. An insect bite is a small wound caused by a stinging or biting insect.  INSTRUCTIONS:  Please remember to taper your Phentermine dosage to 30mg  daily for two weeks, then 15mg  daily for two weeks, before discontinuing. Apply heat to your bug bite for 15-20 minutes daily. If there's no improvement in a month, please let us know. Also, please schedule an appointment with a dermatologist for your acne. Lastly, don't forget to come in for your full blood panel next month.

## 2022-11-03 NOTE — Telephone Encounter (Signed)
Received a fax from covermymeds for phentermine [facility] 15mg   Key: [id number]

## 2022-11-03 NOTE — Progress Notes (Unsigned)
Established patient visit   Patient: Haley Washington   DOB: Jun 29, 1985   37 y.o. Female  MRN: [id number] Visit Date: 11/03/2022  Today's healthcare provider: Ronnald Ramp, MD   Chief Complaint  Patient presents with   Follow-up    1 month f/u weight management , look at bug bite and recommendations needed for dermatalogist    Subjective     HPI     Follow-up    Additional comments: 1 month f/u weight management , look at bug bite and recommendations needed for dermatalogist       Last edited by Clois Comber on 11/03/2022 10:31 AM.       Discussed the use of AI scribe software for clinical note transcription with the patient, who gave verbal consent to proceed.  History of Present Illness   The patient, a 37 year old female with a history of obesity (BMI 45), presents for a follow-up visit. Despite being on phentermine 37.5 mg, she has not experienced significant weight loss. She was previously referred to bariatric surgery, but insurance did not cover GLP-1 agonist.  She reports a recent weight loss of 2 pounds, but acknowledges that different scales may account for this discrepancy. She has been referred to a bariatric surgeon and is scheduled to meet with her in the near future. She is aware of the lifestyle changes required for successful bariatric surgery and is prepared to make these changes.  She also presents for follow-up of lower extremity ablation. The second ablation on the left leg was postponed due to the surgeon's illness, causing a delay in her treatment plan. She reports returning to the gym and walking on the treadmill, despite some residual soreness.  She continues to take phentermine 37.5 mg daily but is open to tapering off the medication. She reports noticing a decrease in energy levels when not taking the medication.  She also reports a persistent bug bite on the lower extremity, which is not painful or itchy but remains hard and  swollen. She has a history of a similar lesion that became infected and left a permanent scar. She is advised to apply heat to the area to help dissipate the inflammation.  She also reports a flare-up of acne, which was previously managed with doxycycline. She has concerns about the long-term use of antibiotics due to previous experiences with gut health issues. She is referred to dermatology for further evaluation and management.  In summary, she presents for follow-up of obesity and lower extremity ablation, with additional concerns about acne and a persistent bug bite. She is actively engaged in her healthcare and is making efforts to improve her health, including pursuing bariatric surgery and maintaining an exercise regimen.        Health Maint Recommended for updated pap smear and HPV testing    Past Medical History:  Diagnosis Date   Acne    has been on aldactone in the past.  Also tried Accutane without improvement in her symptoms    Allergy    History of chicken pox    Hx: UTI (urinary tract infection)    Hyperlipidemia    Low back pain 2017   improved with physical therapy   Morbid obesity with BMI of 40.0-44.9, adult (HCC)     Medications: Outpatient Medications Prior to Visit  Medication Sig   Azelastine-Fluticasone 137-50 MCG/ACT SUSP 1 spray in each nostril Nasally Twice a day for 30 days   benzonatate (TESSALON) 200 MG capsule Take  1 capsule (200 mg total) by mouth 2 (two) times daily as needed for cough.   EPINEPHrine 0.3 mg/0.3 mL IJ SOAJ injection Inject into the muscle.   levonorgestrel (MIRENA, 52 MG,) 20 MCG/DAY IUD Device provided by care center   montelukast (SINGULAIR) 10 MG tablet 1 tablet Orally Once a day for 30 days   [DISCONTINUED] phentermine 37.5 MG capsule Take 1 capsule (37.5 mg total) by mouth every morning.   No facility-administered medications prior to visit.    Review of Systems  Lab Results  Component Value Date   WBC 6.1 12/23/2021    HGB 13.3 12/23/2021   HCT 40.7 12/23/2021   MCV 91 12/23/2021   PLT 245 12/23/2021      Last vitamin B12 and Folate No results found for: "VITAMINB12", "FOLATE"      Objective    BP (!) 124/59 (BP Location: Left Arm, Patient Position: Sitting, Cuff Size: Large)   Pulse 84   Temp 97.6 F (36.4 C)   Resp 14   Ht 5\' 5"  (1.651 m)   Wt 271 lb 6.4 oz (123.1 kg)   SpO2 100%   BMI 45.16 kg/m   BP Readings from Last 3 Encounters:  11/03/22 (!) 124/59  09/29/22 128/89  09/17/22 121/85   Wt Readings from Last 3 Encounters:  11/03/22 271 lb 6.4 oz (123.1 kg)  09/29/22 273 lb 4.8 oz (124 kg)  09/17/22 270 lb 9.6 oz (122.7 kg)       Physical Exam  Physical Exam   MEASUREMENTS: BMI- 45 SKIN: Hard bump consistent with insect bite, not erythematous, draining, or tender.      General: female appearing stated age in no acute distress  Cardio: Normal S1 and S2, no S3 or S4. Rhythm is regular. No murmurs or rubs.  Bilateral radial pulses palpable Pulm: Clear to auscultation bilaterally, no crackles, wheezing, or diminished breath sounds. Normal respiratory effort   No results found for any visits on 11/03/22.  Assessment & Plan     Problem List Items Addressed This Visit     Chronic venous insufficiency   Morbid obesity with BMI of 45.0-49.9, adult (HCC) - Primary   Relevant Medications   phentermine 15 MG capsule   Other Visit Diagnoses     Encounter for immunization       Relevant Orders   Flu vaccine trivalent PF, 6mos and older(Flulaval,Afluria,Fluarix,Fluzone) (Completed)   Acne vulgaris       Relevant Orders   Ambulatory referral to Dermatology       Assessment and Plan    Obesity BMI 45, minimal weight loss on Phentermine 37.5mg . Bariatric surgery referral in progress. Patient experiencing fatigue as Phentermine is tapered. -Taper Phentermine to 30mg  daily for two weeks, then 15mg  daily for two weeks, then discontinue. -Continue bariatric surgery  referral process.  Acne Flare-ups, previously treated with Accutane and Doxycycline. Concerns about gut health with long-term antibiotic use. -Refer to Dermatology for further evaluation and management.  Insect Bite Persistent, non-painful, non-itchy lesion on leg, likely from an insect bite. No signs of infection. -Apply heat for 15-20 minutes daily to help dissipate inflammation. -If no improvement in one month, consider further intervention.  General Health Maintenance -Administered flu shot today. -Schedule full blood panel for next month.         Return in about 1 month (around 12/04/2022) for CPE.         Ronnald Ramp, MD  [facility] [phone] (phone) [phone] (fax)  [facility]

## 2022-11-04 ENCOUNTER — Ambulatory Visit: Payer: BC Managed Care – PPO | Admitting: Family Medicine

## 2022-11-04 ENCOUNTER — Encounter (INDEPENDENT_AMBULATORY_CARE_PROVIDER_SITE_OTHER): Payer: BC Managed Care – PPO

## 2022-11-04 NOTE — Telephone Encounter (Signed)
Requested medication (s) are due for refill today:   Provider to review  Requested medication (s) are on the active medication list:   Yes  Future visit scheduled:   Yes 11/26   Last ordered: 11/03/2022 #30, 0 refills  Duplicate non delegated refill   Requested Prescriptions  Pending Prescriptions Disp Refills   phentermine 15 MG capsule [Pharmacy Med Name: PHENTERMINE HCL 15MG  CAPSULES] 30 capsule     Sig: TAKE AS DIRECTED CLARIFY DIRECTIONS     Not Delegated - Neurology: Anticonvulsants - Controlled - phentermine hydrochloride Failed - 11/03/2022  5:49 PM      Failed - This refill cannot be delegated      Passed - eGFR in normal range and within 360 days    GFR calc Af Amer  Date Value Ref Range Status  10/26/2016 >60 >60 mL/min Final    Comment:    (NOTE) The eGFR has been calculated using the CKD EPI equation. This calculation has not been validated in all clinical situations. eGFR's persistently <60 mL/min signify possible Chronic Kidney Disease.    GFR, Estimated  Date Value Ref Range Status  07/24/2020 >60 >60 mL/min Final    Comment:    (NOTE) Calculated using the CKD-EPI Creatinine Equation (2021)    GFR  Date Value Ref Range Status  08/12/2016 92.84 >60.00 mL/min Final   eGFR  Date Value Ref Range Status  12/23/2021 96 >59 mL/min/1.73 Final         Passed - Cr in normal range and within 360 days    Creatinine, Ser  Date Value Ref Range Status  12/23/2021 0.81 0.57 - 1.00 mg/dL Final         Passed - Last BP in normal range    BP Readings from Last 1 Encounters:  11/03/22 (!) 124/59         Passed - Valid encounter within last 6 months    Recent Outpatient Visits           Yesterday Morbid obesity with BMI of 45.0-49.9, adult (HCC)   [facility] Simmons-Robinson, [place], MD   1 month ago Morbid obesity with BMI of 45.0-49.9, adult (HCC)   [facility] Simmons-Robinson, [place], MD   1  month ago Morbid obesity with BMI of 45.0-49.9, adult (HCC)   [facility] Simmons-Robinson, [place], MD   2 months ago COVID-19   [facility] [place] [facility], [place], PA-C   3 months ago Morbid obesity with BMI of 40.0-44.9, adult (HCC)   [facility] Simmons-Robinson, Tawanna Cooler, MD       Future Appointments             In 1 month Simmons-Robinson, Tawanna Cooler, MD [facility]   In 3 months Elie Goody, MD [facility]            Passed - Weight completed in the last 3 months    Wt Readings from Last 1 Encounters:  11/03/22 271 lb 6.4 oz (123.1 kg)

## 2022-11-05 ENCOUNTER — Ambulatory Visit: Payer: BC Managed Care – PPO

## 2022-11-06 ENCOUNTER — Encounter: Payer: Self-pay | Admitting: Family Medicine

## 2022-11-06 NOTE — Telephone Encounter (Signed)
PA-Denied.  Phentermine not covered.

## 2022-11-09 MED ORDER — PHENTERMINE HCL 15 MG PO CAPS: 30.0000 mg | ORAL_CAPSULE | ORAL | 0 refills | Status: DC

## 2022-11-09 NOTE — Telephone Encounter (Signed)
Received a fax from covermymeds for phentermine [employer]  Key: [id number]

## 2022-11-10 ENCOUNTER — Encounter (INDEPENDENT_AMBULATORY_CARE_PROVIDER_SITE_OTHER): Payer: Self-pay | Admitting: Nurse Practitioner

## 2022-11-10 ENCOUNTER — Ambulatory Visit (INDEPENDENT_AMBULATORY_CARE_PROVIDER_SITE_OTHER): Payer: BC Managed Care – PPO | Admitting: Nurse Practitioner

## 2022-11-10 VITALS — BP 129/93 | HR 60 | Resp 16 | Wt 271.4 lb

## 2022-11-10 DIAGNOSIS — I83813 Varicose veins of bilateral lower extremities with pain: Secondary | ICD-10-CM

## 2022-11-13 ENCOUNTER — Ambulatory Visit: Payer: BC Managed Care – PPO

## 2022-11-13 DIAGNOSIS — Z23 Encounter for immunization: Secondary | ICD-10-CM | POA: Diagnosis not present

## 2022-11-13 DIAGNOSIS — Z719 Counseling, unspecified: Secondary | ICD-10-CM

## 2022-11-13 NOTE — Telephone Encounter (Signed)
Denied.

## 2022-11-23 ENCOUNTER — Encounter (INDEPENDENT_AMBULATORY_CARE_PROVIDER_SITE_OTHER): Payer: Self-pay | Admitting: Nurse Practitioner

## 2022-11-23 NOTE — Progress Notes (Signed)
Subjective:    Patient ID: Haley Washington, female    DOB: April 21, 1985, 37 y.o.   MRN: [id number] Chief Complaint  Patient presents with   Follow-up    4 week post laser follow up    The patient is a 37 year old female who returns today following a  left lower extremity great saphenous vein laser.  Studies show that the great saphenous vein was treated successfully.  The patient has an upcoming right lower extremity laser scheduled.  She notes that her pain and symptoms have improved following the left great saphenous vein laser.  She does not have any lingering side effects or issues.    Review of Systems  Cardiovascular:  Positive for leg swelling.  All other systems reviewed and are negative.      Objective:   Physical Exam Vitals reviewed.  HENT:     Head: Normocephalic.  Cardiovascular:     Rate and Rhythm: Normal rate.  Pulmonary:     Effort: Pulmonary effort is normal.  Skin:    General: Skin is warm and dry.  Neurological:     Mental Status: She is alert and oriented to person, place, and time.  Psychiatric:        Mood and Affect: Mood normal.        Behavior: Behavior normal.        Thought Content: Thought content normal.        Judgment: Judgment normal.     BP (!) 129/93 (BP Location: Left Arm)   Pulse 60   Resp 16   Wt 271 lb 6.4 oz (123.1 kg)   BMI 45.16 kg/m   Past Medical History:  Diagnosis Date   Acne    has been on aldactone in the past.  Also tried Accutane without improvement in her symptoms    Allergy    History of chicken pox    Hx: UTI (urinary tract infection)    Hyperlipidemia    Low back pain 2017   improved with physical therapy   Morbid obesity with BMI of 40.0-44.9, adult (HCC)     Social History   Socioeconomic History   Marital status: Married    Spouse name: Vicente Serene   Number of children: Not on file   Years of education: Not on file   Highest education level: Master's degree (e.g., MA, MS, MEng, MEd, MSW, MBA)   Occupational History   Not on file  Tobacco Use   Smoking status: Never    Passive exposure: Never   Smokeless tobacco: Never  Vaping Use   Vaping status: Never Used  Substance and Sexual Activity   Alcohol use: Yes    Comment: rarely   Drug use: No   Sexual activity: Yes    Partners: Male    Birth control/protection: None    Comment: natural family planning (NFP)  Other Topics Concern   Not on file  Social History Narrative   Grew up in [place], moved here from [place] 2018   Married    Masters degree   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, adult for global education   Social Determinants of Health   Financial Resource Strain: Low Risk  (11/13/2022)   Received from [employer]   Overall Financial Resource Strain (CARDIA)    Difficulty of Paying Living Expenses: Not hard at all  Food Insecurity: No Food Insecurity (11/13/2022)   Received from [facility]   Hunger Vital Sign    Worried About Running Out of  Food in the Last Year: Never true    Ran Out of Food in the Last Year: Never true  Transportation Needs: No Transportation Needs (11/13/2022)   Received from [facility] - Transportation    Lack of Transportation (Medical): No    Lack of Transportation (Non-Medical): No  Physical Activity: Insufficiently Active (11/13/2022)   Received from [facility]   Exercise Vital Sign    Days of Exercise per Week: 2 days    Minutes of Exercise per Session: 60 min  Stress: No Stress Concern Present (11/13/2022)   Received from [facility] - Occupational Stress Questionnaire    Feeling of Stress : Not at all  Social Connections: Moderately Integrated (11/13/2022)   Received from [facility]   Social Network    How would you rate your social network (family, work, friends)?: Adequate participation with social networks  Intimate Partner Violence: Not At Risk (11/13/2022)   Received from [facility]   HITS     Over the last 12 months how often did your partner physically hurt you?: 1    Over the last 12 months how often did your partner insult you or talk down to you?: 2    Over the last 12 months how often did your partner threaten you with physical harm?: 1    Over the last 12 months how often did your partner scream or curse at you?: 1    Past Surgical History:  Procedure Laterality Date   ADENOIDECTOMY     DILATION AND EVACUATION N/A 10/27/2016   Procedure: DILATATION AND EVACUATION;  Surgeon: Waynard Reeds, MD;  Location: [facility];  Service: Gynecology;  Laterality: N/A;    Family History  Problem Relation Age of Onset   Hyperlipidemia Mother    Hypertension Mother    Obesity Mother    Alcohol abuse Father        recurring   Breast cancer Maternal Aunt    Mental illness Maternal Grandmother        anxiety   Arthritis Maternal Grandfather    Cancer Maternal Grandfather        colon, diagnosed in 15's   Diabetes Maternal Grandfather    Heart disease Paternal Grandfather    Cancer Paternal Grandfather        lymphoma, ? lung CA- smoker   Obesity Brother     Allergies  Allergen Reactions   Lactose Intolerance (Gi) Other (See Comments)    GI upset   Dust Mite Extract    Molds & Smuts    Pollen Extract-Tree Extract [Pollen Extract] Itching       Latest Ref Rng & Units 12/23/2021    1:51 PM 07/26/2020    6:34 AM 07/25/2020    5:21 AM  CBC  WBC 3.4 - 10.8 x10E3/uL 6.1  9.3  7.0   Hemoglobin 11.1 - 15.9 g/dL 82.9  9.6  56.2   Hematocrit 34.0 - 46.6 % 40.7  29.3  33.1   Platelets 150 - 450 x10E3/uL 245  212  205       CMP     Component Value Date/Time   NA 139 12/23/2021 1351   K 4.5 12/23/2021 1351   CL 103 12/23/2021 1351   CO2 23 12/23/2021 1351   GLUCOSE 109 (H) 12/23/2021 1351   GLUCOSE 102 (H) 07/24/2020 1933   BUN 16 12/23/2021 1351   CREATININE 0.81 12/23/2021 1351   CALCIUM 9.6  12/23/2021 1351   PROT 7.2 12/23/2021 1351   ALBUMIN 4.5 12/23/2021  1351   AST 21 12/23/2021 1351   ALT 28 12/23/2021 1351   ALKPHOS 76 12/23/2021 1351   BILITOT 0.2 12/23/2021 1351   GFR 92.84 08/12/2016 0825   EGFR 96 12/23/2021 1351   GFRNONAA >60 07/24/2020 1933     No results found.     Assessment & Plan:   1. Varicose veins of bilateral lower extremities with pain The patient has underwent laser of the left lower extremity.  This is done well so far.  She has an upcoming right lower extremity laser.  We will discussed the possibility of sclerotherapy postintervention.   Current Outpatient Medications on File Prior to Visit  Medication Sig Dispense Refill   Azelastine-Fluticasone 137-50 MCG/ACT SUSP 1 spray in each nostril Nasally Twice a day for 30 days     EPINEPHrine 0.3 mg/0.3 mL IJ SOAJ injection Inject into the muscle.     levonorgestrel (MIRENA, 52 MG,) 20 MCG/DAY IUD Device provided by care center     montelukast (SINGULAIR) 10 MG tablet 1 tablet Orally Once a day for 30 days     phentermine 15 MG capsule Take 2 capsules (30 mg total) by mouth every morning. 60 capsule 0   benzonatate (TESSALON) 200 MG capsule Take 1 capsule (200 mg total) by mouth 2 (two) times daily as needed for cough. 30 capsule 0   No current facility-administered medications on file prior to visit.    There are no Patient Instructions on file for this visit. No follow-ups on file.   Georgiana Spinner, NP

## 2022-11-24 ENCOUNTER — Encounter: Payer: Self-pay | Admitting: Family Medicine

## 2022-11-24 ENCOUNTER — Other Ambulatory Visit: Payer: Self-pay | Admitting: Family Medicine

## 2022-11-24 ENCOUNTER — Ambulatory Visit (INDEPENDENT_AMBULATORY_CARE_PROVIDER_SITE_OTHER): Payer: BC Managed Care – PPO | Admitting: Nurse Practitioner

## 2022-11-24 DIAGNOSIS — L700 Acne vulgaris: Secondary | ICD-10-CM

## 2022-11-24 NOTE — Progress Notes (Signed)
Received MyChart message requesting to be referred to [facility] instead of [facility]   New derm referral submitted

## 2022-12-03 ENCOUNTER — Encounter: Payer: BC Managed Care – PPO | Admitting: Family Medicine

## 2022-12-08 NOTE — Progress Notes (Unsigned)
Complete physical exam   Patient: Haley Washington   DOB: 12-30-1985   37 y.o. Female  MRN: [id number] Visit Date: 12/09/2022  Today's healthcare provider: Ronnald Ramp, MD   No chief complaint on file.  Subjective    Haley Washington is a 37 y.o. female who presents today for a complete physical exam.   She reports consuming a {diet types:17450} diet.   {Exercise:19826}   She generally feels {well/fairly well/poorly:18703}.   She reports sleeping {well/fairly well/poorly:18703}.    She {does/does not:200015} have additional problems to discuss today.   Discussed the use of AI scribe software for clinical note transcription with the patient, who gave verbal consent to proceed.  History of Present Illness            ***review care everywhere labs  Past Medical History:  Diagnosis Date   Acne    has been on aldactone in the past.  Also tried Accutane without improvement in her symptoms    Allergy    History of chicken pox    Hx: UTI (urinary tract infection)    Hyperlipidemia    Low back pain 2017   improved with physical therapy   Morbid obesity with BMI of 40.0-44.9, adult [facility])    Past Surgical History:  Procedure Laterality Date   ADENOIDECTOMY     DILATION AND EVACUATION N/A 10/27/2016   Procedure: DILATATION AND EVACUATION;  Surgeon: Waynard Reeds, MD;  Location: [facility] BIRTHING SUIT[facility];  Service: Gynecology;  Laterality: N/A;   Social History   Socioeconomic History   Marital status: Married    Spouse name: Vicente Serene   Number of children: Not on file   Years of education: Not on file   Highest education level: Master's degree (e.g., MA, MS, MEng, MEd, MSW, MBA)  Occupational History   Not on file  Tobacco Use   Smoking status: Never    Passive exposure: Never   Smokeless tobacco: Never  Vaping Use   Vaping status: Never Used  Substance and Sexual Activity   Alcohol use: Yes    Comment: rarely   Drug use: No   Sexual activity: Yes     Partners: Male    Birth control/protection: None    Comment: natural family planning (NFP)  Other Topics Concern   Not on file  Social History Narrative   Grew up in [place], moved here from [place] 2018   Married    Masters degree   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, adult for global education   Social Determinants of Health   Financial Resource Strain: Low Risk  (11/13/2022)   Received from [facility]   Overall Financial Resource Strain (CARDIA)    Difficulty of Paying Living Expenses: Not hard at all  Food Insecurity: No Food Insecurity (11/13/2022)   Received from [facility]   Hunger Vital Sign    Worried About Running Out of Food in the Last Year: Never true    Ran Out of Food in the Last Year: Never true  Transportation Needs: No Transportation Needs (11/13/2022)   [facility] from [facility] - Transportation    Lack of Transportation (Medical): No    Lack of Transportation (Non-Medical): No  Physical Activity: Insufficiently Active (11/13/2022)   Received from [facility]   Exercise Vital Sign    Days of Exercise per Week: 2 days    Minutes of Exercise per Session: 60 min  Stress: No Stress Concern Present (11/13/2022)  Received from [facility] - Occupational Stress Questionnaire    Feeling of Stress : Not at all  Social Connections: Moderately Integrated (11/13/2022)   Received from [facility]   Social Network    How would you rate your social network (family, work, friends)?: Adequate participation with social networks  Intimate Partner Violence: Not At Risk (11/13/2022)   Received from [facility]   HITS    Over the last 12 months how often did your partner physically hurt you?: Never    Over the last 12 months how often did your partner insult you or talk down to you?: Rarely    Over the last 12 months how often did your partner threaten you with physical harm?: Never    Over the last 12  months how often did your partner scream or curse at you?: Never   Family Status  Relation Name Status   Mother  Alive   Father  Alive   Mat Aunt  (Not Specified)   MGM  Alive   MGF  Alive   PGM  Deceased   PGF  Deceased   Brother  Alive  No partnership data on file   Family History  Problem Relation Age of Onset   Hyperlipidemia Mother    Hypertension Mother    Obesity Mother    Alcohol abuse Father        recurring   Breast cancer Maternal Aunt    Mental illness Maternal Grandmother        anxiety   Arthritis Maternal Grandfather    Cancer Maternal Grandfather        colon, diagnosed in 39's   Diabetes Maternal Grandfather    Heart disease Paternal Grandfather    Cancer Paternal Grandfather        lymphoma, ? lung CA- smoker   Obesity Brother    Allergies  Allergen Reactions   Lactose Intolerance (Gi) Other (See Comments)    GI upset   Dust Mite Extract    Molds & Smuts    Pollen Extract-Tree Extract [Pollen Extract] Itching     Medications: Outpatient Medications Prior to Visit  Medication Sig   Azelastine-Fluticasone 137-50 MCG/ACT SUSP 1 spray in each nostril Nasally Twice a day for 30 days   benzonatate (TESSALON) 200 MG capsule Take 1 capsule (200 mg total) by mouth 2 (two) times daily as needed for cough.   EPINEPHrine 0.3 mg/0.3 mL IJ SOAJ injection Inject into the muscle.   levonorgestrel (MIRENA, 52 MG,) 20 MCG/DAY IUD Device provided by care center   montelukast (SINGULAIR) 10 MG tablet 1 tablet Orally Once a day for 30 days   phentermine 15 MG capsule Take 2 capsules (30 mg total) by mouth every morning.   No facility-administered medications prior to visit.    Review of Systems  {Insert previous labs (optional):23779} {See past labs  Heme  Chem  Endocrine  Serology  Results Review (optional):1}  Objective    There were no vitals taken for this visit. {Insert last BP/Wt (optional):23777}{See vitals history (optional):1}    Physical  Exam  ***  Last depression screening scores    11/03/2022   10:31 AM 09/29/2022    4:10 PM 07/21/2022    2:44 PM  PHQ 2/9 Scores  PHQ - 2 Score 1 0 1  PHQ- 9 Score 2 1 2     Last fall risk screening    09/29/2022    4:10 PM  Fall Risk   Falls in the past year? 0  Number falls in past yr: 0  Injury with Fall? 0    Last Audit-C alcohol use screening    11/03/2022    7:42 AM  Alcohol Use Disorder Test (AUDIT)  1. How often do you have a drink containing alcohol? 2  2. How many drinks containing alcohol do you have on a typical day when you are drinking? 0  3. How often do you have six or more drinks on one occasion? 0  AUDIT-C Score 2   A score of 3 or more in women, and 4 or more in men indicates increased risk for alcohol abuse, EXCEPT if all of the points are from question 1   No results found for any visits on 12/09/22.  Assessment & Plan    Routine Health Maintenance and Physical Exam  Immunization History  Administered Date(s) Administered   Influenza, Seasonal, Injecte, Preservative Fre 11/03/2022   Influenza,inj,Quad PF,6+ Mos 10/19/2016, 09/29/2017   Influenza-Unspecified 10/20/2018, 11/16/2019, 10/20/2021   MMR 12/02/2017   PFIZER(Purple Top)SARS-COV-2 Vaccination 03/23/2019, 04/20/2019, 10/20/2019   Pfizer Covid-19 Vaccine Bivalent Booster 79yrs & up 10/28/2020   Pfizer(Comirnaty)Fall Seasonal Vaccine 12 years and older 10/20/2021, 11/13/2022   Tdap 12/25/2016, 06/03/2020    Health Maintenance  Topic Date Due   Hepatitis C Screening  Never done   Cervical Cancer Screening (HPV/Pap Cotest)  Never done   DTaP/Tdap/Td (3 - Td or Tdap) 06/04/2030   INFLUENZA VACCINE  Completed   COVID-19 Vaccine  Completed   HIV Screening  Completed   HPV VACCINES  Aged Out    Problem List Items Addressed This Visit   None   Assessment and Plan                No follow-ups on file.       Ronnald Ramp, MD  [facility] [phone] (phone) [phone] (fax)  [facility]

## 2022-12-09 ENCOUNTER — Encounter: Payer: Self-pay | Admitting: Family Medicine

## 2022-12-09 ENCOUNTER — Ambulatory Visit (INDEPENDENT_AMBULATORY_CARE_PROVIDER_SITE_OTHER): Payer: BC Managed Care – PPO | Admitting: Family Medicine

## 2022-12-09 VITALS — BP 126/72 | HR 65 | Resp 18 | Ht 65.0 in | Wt 267.0 lb

## 2022-12-09 DIAGNOSIS — Z0000 Encounter for general adult medical examination without abnormal findings: Secondary | ICD-10-CM | POA: Insufficient documentation

## 2022-12-09 DIAGNOSIS — Z6841 Body Mass Index (BMI) 40.0 and over, adult: Secondary | ICD-10-CM | POA: Diagnosis not present

## 2022-12-09 HISTORY — DX: Encounter for general adult medical examination without abnormal findings: Z00.00

## 2022-12-09 NOTE — Assessment & Plan Note (Addendum)
37 year old presenting for an annual physical examination.  Reviewed outside Labs: normal TSH, Hemoglobin A1c in prediabetes range, elevated LDL, normal triglycerides, mildly low Vitamin D. - Recheck blood pressure due to elevated diastolic reading -she will need cervical cancer screening with pap smear at follow up  - counseled on benefits of resistance training and continued exercise efforts for maintaining healthy weight pre and post bariatric surgery, pt has been working out at the gym and plans to start working with a Media planner

## 2022-12-09 NOTE — Assessment & Plan Note (Signed)
Current weight 267 lbs, down from 271 lbs since October 22. Discussed weight loss efforts and upcoming bariatric surgery. Emphasized pre-surgery lifestyle changes and potential outcomes, including significant weight loss and improvement in comorbid conditions such as prediabetes and hyperlipidemia. Noted 97-98% of gastric bypass patients no longer need CPAP for sleep apnea. - Continue current diet and exercise regimen - Proceed with bariatric surgery preparations - Schedule follow-up appointments with bariatric team

## 2022-12-09 NOTE — Patient Instructions (Signed)
Preventing Vitamin D Deficiency Vitamin D deficiency is when your body does not have enough vitamin D. Vitamin D is important because it helps your body maintain calcium and phosphorus levels. It plays a key role in the health of bones and teeth, reduces inflammation, and improves the body's defense system (immune system). Our bodies make vitamin D when our skin is exposed to direct sunlight. However, for many people, this may not be enough vitamin D to meet the body's needs. How can this condition affect me? If vitamin D deficiency is severe, it can cause a condition in which a person's bones become softer than normal. In adults, this condition is called osteomalacia. In children, this condition is called rickets. Vitamin D deficiency can also cause weak or thin bones (osteoporosis) in adults. What can increase my risk? You may be at risk for a vitamin D deficiency if you: Are pregnant. Are obese. Are an older adult. Have dark skin. Take certain medicines that affect the way vitamin D is absorbed. Have had a surgery in which a part of the stomach or a part of the small intestine was removed. Other risk factors include: Having a condition that limits your ability to absorb fat, such as Crohn's disease, long-term (chronic) pancreatitis, or cystic fibrosis. Having certain conditions that are passed from parent to child (inherited). Not having access to foods rich in vitamin D. Having limited ability to move and go outside safely. Living in areas that have fewer hours of sunlight. Spending most of your day indoors, or covering your skin all the time when you are outdoors. What actions can I take to reduce my risk of a vitamin D deficiency? Knowing the best sources of vitamin D You can meet your daily vitamin D needs from: Foods. Dietary supplements. Direct exposure to natural sunlight. Infant formula, for infants. Knowing how much vitamin D you need General recommendations for daily vitamin  D intake vary by these categories: Infants: 400 international units (IU). Children older than 1 year: 600 international units. Adults: 600 international units. Pregnant and breastfeeding women: 600 international units. Adults older than 70 years: 800 international units. These are minimum levels of recommended amounts. Your health care provider may recommend a different amount of vitamin D intake based on your specific needs and your overall health. Getting sun exposure Get regular, safe exposure to natural sunlight. Expose your skin to direct sunlight for at least 15 minutes every day. If you have dark skin, you may need to expose your skin for a longer period of time. Protect your skin from too much sun exposure. This helps to prevent skin cancer. Ask your health care provider if regular sun exposure is safe for you. Do not use a tanning bed. Eating and drinking  Eat foods that naturally contain vitamin D. These include: Beef liver. Eggs. The vitamin D is in the yolk. Fish, such as salmon or trout. Mushrooms that were treated with UV light. Eat or drink products that have vitamin D added to them (are fortified). These may include: Cereals. Milk, including plant-based alternatives such as almond, soy, or oat milks. Orange juice. Margarine. When choosing foods, check the food label on the package to see: How much vitamin D is in the item. If the food is fortified with vitamin D. The items listed above may not be a complete list of foods and beverages you can eat and drink. Contact a dietitian for more information. Taking supplements and medicines If you are at risk for vitamin  D deficiency, or if you have certain diseases, your health care provider may recommend that you take a vitamin D supplement. Make sure you: Talk with your health care provider before you start taking any vitamin D supplements. You may be more sensitive to the side effects of vitamin D supplements if you are on  certain medicines or have certain medical conditions. Tell your health care provider about all medicines you are taking, including vitamins, herbs, eye drops, creams, and over-the-counter medicines. Take over-the-counter and prescription medicines only as told by your health care provider. Take supplements only as told by your health care provider. To increase absorption of your supplement, take it with a meal or snack. Summary Vitamin D plays a key role in the health of bones and teeth, reduces inflammation, and improves the body's defense system (immune system). A vitamin D deficiency can put you at risk of developing conditions such as rickets or osteoporosis. Our bodies make vitamin D when our skin is exposed to direct sunlight. However, for many people, this may not be enough vitamin D to meet the body's needs. Some foods naturally contain vitamin D, including beef liver, egg yolk, and fish. Eat or drink products that have vitamin D added to them (are fortified). This information is not intended to replace advice given to you by your health care provider. Make sure you discuss any questions you have with your health care provider. Document Revised: 10/11/2020 Document Reviewed: 10/11/2020 Elsevier Patient Education  2024 [employer].

## 2022-12-15 ENCOUNTER — Encounter: Payer: BC Managed Care – PPO | Admitting: Family Medicine

## 2022-12-23 ENCOUNTER — Encounter: Payer: Self-pay | Admitting: Family Medicine

## 2023-01-07 ENCOUNTER — Other Ambulatory Visit (INDEPENDENT_AMBULATORY_CARE_PROVIDER_SITE_OTHER): Payer: BC Managed Care – PPO | Admitting: Vascular Surgery

## 2023-01-14 ENCOUNTER — Encounter (INDEPENDENT_AMBULATORY_CARE_PROVIDER_SITE_OTHER): Payer: BC Managed Care – PPO

## 2023-02-02 ENCOUNTER — Ambulatory Visit (INDEPENDENT_AMBULATORY_CARE_PROVIDER_SITE_OTHER): Payer: BC Managed Care – PPO | Admitting: Nurse Practitioner

## 2023-02-09 ENCOUNTER — Ambulatory Visit: Payer: BC Managed Care – PPO | Admitting: Dermatology

## 2023-02-15 ENCOUNTER — Other Ambulatory Visit (INDEPENDENT_AMBULATORY_CARE_PROVIDER_SITE_OTHER): Payer: BC Managed Care – PPO | Admitting: Vascular Surgery

## 2023-02-18 ENCOUNTER — Other Ambulatory Visit (INDEPENDENT_AMBULATORY_CARE_PROVIDER_SITE_OTHER): Payer: BC Managed Care – PPO | Admitting: Vascular Surgery

## 2023-02-25 ENCOUNTER — Encounter (INDEPENDENT_AMBULATORY_CARE_PROVIDER_SITE_OTHER): Payer: BC Managed Care – PPO

## 2023-03-04 ENCOUNTER — Encounter: Payer: Self-pay | Admitting: Physician Assistant

## 2023-03-04 ENCOUNTER — Ambulatory Visit: Payer: Self-pay

## 2023-03-04 ENCOUNTER — Telehealth: Payer: 59 | Admitting: Physician Assistant

## 2023-03-04 DIAGNOSIS — R6889 Other general symptoms and signs: Secondary | ICD-10-CM

## 2023-03-04 DIAGNOSIS — Z20828 Contact with and (suspected) exposure to other viral communicable diseases: Secondary | ICD-10-CM | POA: Diagnosis not present

## 2023-03-04 MED ORDER — OSELTAMIVIR PHOSPHATE 75 MG PO CAPS: 75.0000 mg | ORAL_CAPSULE | Freq: Two times a day (BID) | ORAL | 0 refills | Status: DC

## 2023-03-04 NOTE — Telephone Encounter (Signed)
  Chief Complaint: FLU SX Symptoms: body aches, cough, chills, tiredness Frequency: last night  Pertinent Negatives: Patient denies fever  Disposition: [] ED /[] Urgent Care (no appt availability in office) / [x] Appointment(In office/virtual)/ []  [facility]/ [] Home Care/ [] Refused Recommended Disposition /[] [facility]/ []  Follow-up with PCP Additional Notes: pt states 38 yr old daughter tested positive Tuesday for Flu. She started having sx overnight and doesn't feel well today. Pt asking for Tamiflu to help with sx since daughter was prescribed as well. Scheduled VV today at 300 with [place], [place]. Care advice given and pt verbalized understanding.   Summary: possible flu   Mom called saying hr 21 year old daughter has the flu/  mom is starting to cough and feel  little bad.  She wants advise as what to do at this point.  CB@  [phone]         Reason for Disposition  [1] Influenza EXPOSURE within last 48 hours (2 days) AND [2] NOT HIGH RISK AND [3] strongly requests antiviral medication  Answer Assessment - Initial Assessment Questions 1. TYPE of EXPOSURE: "How were you exposed?" (e.g., close contact, not a close contact)     2 year daughter tested positive Tues 2. DATE of EXPOSURE: "When did the exposure occur?" (e.g., hour, days, weeks)     Tuesday  4. HIGH RISK for COMPLICATIONS: "Do you have any heart or lung problems?" "Do you have a weakened immune system?" (e.g., CHF, COPD, asthma, HIV positive, chemotherapy, renal failure, diabetes mellitus, sickle cell anemia)     no 5. SYMPTOMS: "Do you have any symptoms?" (e.g., cough, fever, sore throat, difficulty breathing).     Cough, chills, body aches, tiredness  Protocols used: Influenza Exposure-A-AH

## 2023-03-04 NOTE — Progress Notes (Signed)
MyChart Video Visit  Virtual Visit via Video Note   This format is felt to be most appropriate for this patient at this time. Physical exam was limited by quality of the video and audio technology used for the visit.   Provider location: Virtual Visit Location Provider: Office/Clinic Patient Location: Home  I discussed the limitations of evaluation and management by telemedicine and the availability of in person appointments. The patient expressed understanding and agreed to proceed.  Patient: Haley Washington   DOB: [birth year]   37 y.o. Female  MRN: [id number] Visit Date: 03/04/2023  Today's healthcare provider: Debera Lat, PA-C   Chief Complaint  Patient presents with   Acute Visit    Flu-like symptoms    Subjective     Discussed the use of AI scribe software for clinical note transcription with the patient, who gave verbal consent to proceed.  History of Present Illness   Haley Washington, born on Dec 04, 1985, presents with recent onset of coughing and fatigue. Haley Washington reports coughing up 'gunk' and feeling generally achy. These symptoms started overnight. Haley Washington has been caring for her two-year-old child who tested positive for the flu on Tuesday and started Tamiflu. Haley Washington has been in close contact with her child since then. Haley Washington reports feeling chills but has not recorded a temperature. Haley Washington has a painful cough but denies significant nasal congestion or a sore throat. Haley Washington has no known respiratory issues such as asthma, COPD, or rhinitis. Haley Washington has no known kidney issues. Her last kidney function test was two years ago with a GFR of 96.         Medications: Outpatient Medications Prior to Visit  Medication Sig   Azelastine-Fluticasone 137-50 MCG/ACT SUSP 1 spray in each nostril Nasally Twice a day for 30 days   Bacillus Coagulans-Inulin (PROBIOTIC) 1-250 BILLION-MG CAPS Take 1 capsule by mouth daily.   Cholecalciferol (VITAMIN D) 125 MCG (5000 UT) CAPS Take 1 capsule by mouth daily.    doxycycline (ADOXA) 100 MG tablet Take 100 mg by mouth 2 (two) times daily.   EPINEPHrine 0.3 mg/0.3 mL IJ SOAJ injection Inject into the muscle.   levonorgestrel (MIRENA, 52 MG,) 20 MCG/DAY IUD Device provided by care center   Magnesium 400 MG CAPS Take 1 capsule by mouth daily.   montelukast (SINGULAIR) 10 MG tablet 1 tablet Orally Once a day for 30 days   Multiple Vitamin (MULTIVITAMIN ADULT PO) Take 1 capsule by mouth daily.   SUPER B COMPLEX/C PO Take 1 capsule by mouth daily.   Turmeric 05-998 MG CAPS Take 1 capsule by mouth daily.   No facility-administered medications prior to visit.    Review of Systems All negative  Except see HPI       Objective    There were no vitals taken for this visit.       Physical Exam  Constitutional:      General: Haley Washington is not in acute distress.    Appearance: Normal appearance.  HENT:     Head: Normocephalic.  Pulmonary:     Effort: Pulmonary effort is normal. No respiratory distress.  Neurological:     Mental Status: Haley Washington is alert and oriented to person, place, and time. Mental status is at baseline.      Assessment and Plan    Influenza Recent exposure to a confirmed case (daughter) and onset of symptoms including cough, fatigue, and body aches. Within the 48-hour window for antiviral treatment. -Start Tamiflu 75mg  twice daily for 5 days, with  or without food based on tolerance. -Continue symptomatic treatment with Tylenol and/or ibuprofen for fever and body aches. -Use a humidifier, steam from a shower, and Vicks Vapor Rub for cough and potential congestion. -Stay hydrated and consider honey for its antiseptic properties.  Work absence due to illness Patient is symptomatic and potentially contagious. -Provide a work note excusing the patient from work until Monday, March 08, 2023. -Advise patient to return to work once symptoms have subsided and to practice good hand hygiene to prevent further spread of the virus.       Patient education:  Healthy adults are usually contagious from 1 day before symptoms start until 5-7 days after becoming ill.  Children and immunocompromised individuals may shed the virus and remain contagious for a longer period, potentially up to several weeks.  No follow-ups on file.     I discussed the assessment and treatment plan with the patient. The patient was provided an opportunity to ask questions and all were answered. The patient agreed with the plan and demonstrated an understanding of the instructions.   The patient was advised to call back or seek an in-person evaluation if the symptoms worsen or if the condition fails to improve as anticipated.  I, Debera Lat, PA-C have reviewed all documentation for this visit. The documentation on 03/04/2023  for the exam, diagnosis, procedures, and orders are all accurate and complete.  Debera Lat, [facility], MMS [facility] [phone] (phone) [phone] (fax)  [facility]

## 2023-03-18 ENCOUNTER — Ambulatory Visit (INDEPENDENT_AMBULATORY_CARE_PROVIDER_SITE_OTHER): Payer: BC Managed Care – PPO | Admitting: Nurse Practitioner

## 2023-04-04 NOTE — Progress Notes (Unsigned)
    MRN : [id number]  KEISHIA GROUND is a 38 y.o. (Jun 21, 1985) female who presents with chief complaint of No chief complaint on file. .    The patient's right lower extremity was sterilely prepped and draped.  The ultrasound machine was used to visualize the right great saphenous vein throughout its course.  A segment above the knee was selected for access.  The saphenous vein was accessed without difficulty using ultrasound guidance with a micropuncture needle.   An 0.018  wire was placed beyond the saphenofemoral junction through the sheath and the microneedle was removed.  The 65 cm sheath was then placed over the wire and the wire and dilator were removed.  The laser fiber was placed through the sheath and its tip was placed approximately 2 cm below the saphenofemoral junction.  Tumescent anesthesia was then created with a dilute lidocaine solution.  Laser energy was then delivered with constant withdrawal of the sheath and laser fiber.  Approximately 1386 Joules of energy were delivered over a length of 24 cm.  Sterile dressings were placed.  The patient tolerated the procedure well without complications.

## 2023-04-05 ENCOUNTER — Encounter (INDEPENDENT_AMBULATORY_CARE_PROVIDER_SITE_OTHER): Payer: Self-pay | Admitting: Vascular Surgery

## 2023-04-05 ENCOUNTER — Ambulatory Visit (INDEPENDENT_AMBULATORY_CARE_PROVIDER_SITE_OTHER): Payer: BC Managed Care – PPO | Admitting: Vascular Surgery

## 2023-04-05 VITALS — BP 118/74 | HR 58 | Resp 16 | Wt 212.0 lb

## 2023-04-05 DIAGNOSIS — I83813 Varicose veins of bilateral lower extremities with pain: Secondary | ICD-10-CM

## 2023-04-09 ENCOUNTER — Other Ambulatory Visit (INDEPENDENT_AMBULATORY_CARE_PROVIDER_SITE_OTHER): Payer: Self-pay | Admitting: Vascular Surgery

## 2023-04-09 DIAGNOSIS — I83813 Varicose veins of bilateral lower extremities with pain: Secondary | ICD-10-CM

## 2023-04-12 ENCOUNTER — Ambulatory Visit (INDEPENDENT_AMBULATORY_CARE_PROVIDER_SITE_OTHER): Payer: BC Managed Care – PPO

## 2023-04-12 DIAGNOSIS — I83813 Varicose veins of bilateral lower extremities with pain: Secondary | ICD-10-CM | POA: Diagnosis not present

## 2023-04-20 ENCOUNTER — Telehealth (INDEPENDENT_AMBULATORY_CARE_PROVIDER_SITE_OTHER): Payer: Self-pay | Admitting: Vascular Surgery

## 2023-04-20 NOTE — Telephone Encounter (Signed)
 LVM for pt TCB and let me know the provider services # from the back of her insurance card. I am needing in in order to obtain a retro active prior authorization for the laser ablation.

## 2023-05-03 ENCOUNTER — Ambulatory Visit (INDEPENDENT_AMBULATORY_CARE_PROVIDER_SITE_OTHER): Payer: BC Managed Care – PPO | Admitting: Nurse Practitioner

## 2023-05-03 ENCOUNTER — Encounter (INDEPENDENT_AMBULATORY_CARE_PROVIDER_SITE_OTHER): Payer: Self-pay | Admitting: Nurse Practitioner

## 2023-05-03 VITALS — BP 106/74 | HR 52 | Resp 18 | Ht 64.0 in | Wt 202.6 lb

## 2023-05-03 DIAGNOSIS — I83813 Varicose veins of bilateral lower extremities with pain: Secondary | ICD-10-CM

## 2023-05-03 NOTE — Progress Notes (Signed)
 Subjective:    Patient ID: Haley Washington, female    DOB: Jan 17, 1986, 38 y.o.   MRN: [id number] Chief Complaint  Patient presents with   Follow-up    4 week post laser    The patient returns to the office for followup status post laser ablation of the right saphenous vein on 04/05/2023 as well as the left great saphenous vein on 09/17/2022.  The patient note significant improvement in the lower extremity pain but not resolution of the symptoms. The patient notes multiple residual varicosities bilaterally which continued to hurt with dependent positions and remained tender to palpation. The patient's swelling is minimally from preoperative status. The patient continues to wear graduated compression stockings on a daily basis but these are not eliminating the pain and discomfort. The patient continues to use over-the-counter anti-inflammatory medications to treat the pain and related symptoms but this has not given the patient relief. The patient notes the pain in the lower extremities is causing problems with daily exercise, problems at work and even with household activities such as preparing meals and doing dishes.  The patient is otherwise done well and there have been no complications related to the laser procedure or interval changes in the patient's overall   Post laser ultrasound shows successful ablation of the bilateral GSV      Review of Systems  Cardiovascular:  Positive for leg swelling.  All other systems reviewed and are negative.      Objective:   Physical Exam Vitals reviewed.  HENT:     Head: Normocephalic.  Cardiovascular:     Rate and Rhythm: Normal rate.     Pulses: Normal pulses.  Pulmonary:     Effort: Pulmonary effort is normal.  Musculoskeletal:        General: Tenderness present.  Skin:    General: Skin is warm and dry.  Neurological:     Mental Status: She is alert and oriented to person, place, and time.  Psychiatric:        Mood and Affect: Mood  normal.        Behavior: Behavior normal.        Thought Content: Thought content normal.        Judgment: Judgment normal.     BP 106/74   Pulse (!) 52   Resp 18   Ht 5\' 4"  (1.626 m)   Wt 202 lb 9.6 oz (91.9 kg)   BMI 34.78 kg/m   Past Medical History:  Diagnosis Date   Acne    has been on aldactone in the past.  Also tried Accutane without improvement in her symptoms    Allergy    Annual physical exam 12/09/2022   History of chicken pox    Hx: UTI (urinary tract infection)    Hyperlipidemia    Low back pain 2017   improved with physical therapy   Morbid obesity with BMI of 40.0-44.9, adult (HCC)     Social History   Socioeconomic History   Marital status: Married    Spouse name: Vicente Serene   Number of children: Not on file   Years of education: Not on file   Highest education level: Master's degree (e.g., MA, MS, MEng, MEd, MSW, MBA)  Occupational History   Not on file  Tobacco Use   Smoking status: Never    Passive exposure: Never   Smokeless tobacco: Never  Vaping Use   Vaping status: Never Used  Substance and Sexual Activity   Alcohol use: Not  Currently    Comment: rarely   Drug use: Never   Sexual activity: Yes    Partners: Male    Birth control/protection: I.U.D.    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   Social Drivers of [employer]: Low Risk  (02/16/2023)   Received from [facility]   Overall Financial Resource Strain (CARDIA)    Difficulty of Paying Living Expenses: Not hard at all  Food Insecurity: No Food Insecurity (02/16/2023)   Received from [facility]   Hunger Vital Sign    Worried About Running Out of Food in the Last Year: Never true    Ran Out of Food in the Last Year: Never true  Transportation Needs: No Transportation  Needs (02/16/2023)   Received from [facility] - Transportation    Lack of Transportation (Medical): No    Lack of Transportation (Non-Medical): No  Physical Activity: Insufficiently Active (11/13/2022)   Received [facility]   Exercise Vital Sign    Days of Exercise per Week: 2 days    Minutes of Exercise per Session: 60 min  Stress: No Stress Concern Present (01/04/2023)   Received from [facility] - Occupational Stress Questionnaire    Feeling of Stress : Not at all  Social Connections: Moderately Integrated (11/13/2022)   Received from [facility]   Social Network    How would you rate your social network (family, work, friends)?: Adequate participation with social networks  Intimate Partner Violence: Not At Risk (01/04/2023)   Received from [facility]   HITS    Over the last 12 months how often did your partner physically hurt you?: Never    Over the last 12 months how often did your partner insult you or talk down to you?: Never    Over the last 12 months how often did your partner threaten you with physical harm?: Never    Over the last 12 months how often did your partner scream or curse at you?: Never    Past Surgical History:  Procedure Laterality Date   ADENOIDECTOMY     DILATION AND EVACUATION N/A 10/27/2016   Procedure: DILATATION AND EVACUATION;  Surgeon: Waynard Reeds, MD;  Location: [facility] BIRTHING SUITES;  Service: Gynecology;  Laterality: N/A;    Family History  Problem Relation Age of Onset   Hyperlipidemia Mother    Hypertension Mother    Obesity Mother    Arthritis Mother    Diabetes Mother    Varicose Veins Mother    Alcohol abuse Father        recurring   Heart disease Father    Breast cancer Maternal Aunt    Mental illness Maternal Grandmother        anxiety   Arthritis Maternal Grandmother    Varicose Veins Maternal Grandmother    Arthritis Maternal Grandfather    Cancer Maternal  Grandfather        colon, diagnosed in 76's   Diabetes Maternal Grandfather    Heart disease Maternal Grandfather    Kidney disease Maternal Grandfather    Stroke Maternal Grandfather    Heart disease Paternal Grandfather    Cancer Paternal Grandfather  lymphoma, ? lung CA- smoker   Alcohol abuse Paternal Grandfather    Obesity Brother    Alcohol abuse Paternal Uncle    Anxiety disorder Maternal Aunt    Depression Maternal Aunt    Arthritis Maternal Aunt    Arthritis Maternal Aunt     Allergies  Allergen Reactions   Lactose Intolerance (Gi) Other (See Comments)    GI upset   Dust Mite Extract    Molds & Smuts    Pollen Extract-Tree Extract [Pollen Extract] Itching       Latest Ref Rng & Units 12/23/2021    1:51 PM 07/26/2020    6:34 AM 07/25/2020    5:21 AM  CBC  WBC 3.4 - 10.8 x10E3/uL 6.1  9.3  7.0   Hemoglobin 11.1 - 15.9 g/dL 16.1  9.6  09.6   Hematocrit 34.0 - 46.6 % 40.7  29.3  33.1   Platelets 150 - 450 x10E3/uL 245  212  205       CMP     Component Value Date/Time   NA 139 12/23/2021 1351   K 4.5 12/23/2021 1351   CL 103 12/23/2021 1351   CO2 23 12/23/2021 1351   GLUCOSE 109 (H) 12/23/2021 1351   GLUCOSE 102 (H) 07/24/2020 1933   BUN 16 12/23/2021 1351   CREATININE 0.81 12/23/2021 1351   CALCIUM 9.6 12/23/2021 1351   PROT 7.2 12/23/2021 1351   ALBUMIN 4.5 12/23/2021 1351   AST 21 12/23/2021 1351   ALT 28 12/23/2021 1351   ALKPHOS 76 12/23/2021 1351   BILITOT 0.2 12/23/2021 1351   GFR 92.84 08/12/2016 0825   EGFR 96 12/23/2021 1351   GFRNONAA >60 07/24/2020 1933     No results found.     Assessment & Plan:   1. Varicose veins of bilateral lower extremities with pain (Primary) Recommend:  The patient has had successful ablation of the previously incompetent saphenous venous system but still has persistent symptoms of pain and swelling that are having a negative impact on daily life and daily activities.  Patient should undergo  injection foam sclerotherapy to treat the residual varicosities.  The risks, benefits and alternative therapies were reviewed in detail with the patient.  All questions were answered.  The patient agrees to proceed with foam sclerotherapy at their convenience.  The patient will continue wearing the graduated compression stockings and using the over-the-counter pain medications to treat her symptoms.       Current Outpatient Medications on File Prior to Visit  Medication Sig Dispense Refill   aprepitant (EMEND) 40 MG capsule Take 40 mg by mouth every morning.     Azelastine-Fluticasone 137-50 MCG/ACT SUSP Place 1 spray into both nostrils 2 (two) times daily.     Brimonidine Tartrate (LUMIFY) 0.025 % SOLN      Calcium Citrate-Vitamin D (CALCIUM CITRATE + PO) Take by mouth.     cetirizine (ZYRTEC) 10 MG tablet Take 10 mg by mouth daily.     Cholecalciferol (VITAMIN D3) 1.25 MG (50000 UT) CAPS Take 1 capsule by mouth once a week.     doxycycline (VIBRAMYCIN) 50 MG capsule Take 50 mg by mouth daily.     EPINEPHrine 0.3 mg/0.3 mL IJ SOAJ injection Inject into the muscle.     levonorgestrel (MIRENA, 52 MG,) 20 MCG/DAY IUD Device provided by care center     montelukast (SINGULAIR) 10 MG tablet 1 tablet Orally Once a day for 30 days     Multiple Vitamin (MULTIVITAMIN ADULT  PO) Take 1 capsule by mouth daily.     omeprazole (PRILOSEC) 40 MG capsule Take 40 mg by mouth daily.     ondansetron (ZOFRAN-ODT) 8 MG disintegrating tablet Take 8 mg by mouth every 8 (eight) hours as needed.     Psyllium Fiber 0.52 g CAPS Take 1 capsule by mouth 2 (two) times daily.     spironolactone (ALDACTONE) 50 MG tablet Take 50 mg by mouth 2 (two) times daily.     oseltamivir (TAMIFLU) 75 MG capsule Take 1 capsule (75 mg total) by mouth 2 (two) times daily. (Patient not taking: Reported on 05/03/2023) 10 capsule 0   No current facility-administered medications on file prior to visit.    There are no Patient  Instructions on file for this visit. No follow-ups on file.   Taray Normoyle E Courtni Balash, NP

## 2023-05-03 NOTE — Progress Notes (Incomplete)
 Subjective:    Patient ID: Haley Washington, female    DOB: Sep 13, 1985, 38 y.o.   MRN: [id number] Chief Complaint  Patient presents with  . Follow-up    4 week post laser    The patient returns to the office for followup status post laser ablation of the right saphenous vein on 04/05/2023.  The patient note significant improvement in the lower extremity pain but not resolution of the symptoms. The patient notes multiple residual varicosities bilaterally which continued to hurt with dependent positions and remained tender to palpation. The patient's swelling is minimally from preoperative status. The patient continues to wear graduated compression stockings on a daily basis but these are not eliminating the pain and discomfort. The patient continues to use over-the-counter anti-inflammatory medications to treat the pain and related symptoms but this has not given the patient relief. The patient notes the pain in the lower extremities is causing problems with daily exercise, problems at work and even with household activities such as preparing meals and doing dishes.  The patient is otherwise done well and there have been no complications related to the laser procedure or interval changes in the patient's overall   Post laser ultrasound shows successful ablation of the bilateral GSV      Review of Systems     Objective:   Physical Exam  BP 106/74   Pulse (!) 52   Resp 18   Ht 5\' 4"  (1.626 m)   Wt 202 lb 9.6 oz (91.9 kg)   BMI 34.78 kg/m   Past Medical History:  Diagnosis Date  . Acne    has been on aldactone in the past.  Also tried Accutane without improvement in her symptoms   . Allergy   . Annual physical exam 12/09/2022  . History of chicken pox   . Hx: UTI (urinary tract infection)   . Hyperlipidemia   . Low back pain 2017   improved with physical therapy  . Morbid obesity with BMI of 40.0-44.9, adult [facility])     Social History   Socioeconomic History  . Marital  status: Married    Spouse name: Vicente Serene  . Number of children: Not on file  . Years of education: Not on file  . Highest education level: Master's degree (e.g., MA, MS, MEng, MEd, MSW, MBA)  Occupational History  . Not on file  Tobacco Use  . Smoking status: Never    Passive exposure: Never  . Smokeless tobacco: Never  Vaping Use  . Vaping status: Never Used  Substance and Sexual Activity  . Alcohol use: Not Currently    Comment: rarely  . Drug use: Never  . Sexual activity: Yes    Partners: Male    Birth control/protection: I.U.D.    Comment: Mirena  Other Topics Concern  . 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 Difficulty of Paying Living Expenses: Not hard at all  Food Insecurity: No Food Insecurity (02/16/2023)   Received from [facility]   Hunger Vital Sign   . Worried About Programme researcher, broadcasting/film/video in the Last Year: Never true   . Ran Out of Food  in the Last Year: Never true  Transportation Needs: No Transportation Needs (02/16/2023)   Received from [facility] - Transportation   . Lack of Transportation (Medical): No   . Lack of Transportation (Non-Medical): No  Physical Activity: Insufficiently Active (11/13/2022)   Received from [facility]   Exercise Vital Sign   . Days of Exercise per Week: 2 days   . Minutes of Exercise per Session: 60 min  Stress: No Stress Concern Present (01/04/2023)   Received from [facility]ational [facility] - Occupational Stress Questionnaire   . Feeling of Stress : Not at all  Social Connections: Moderately Integrated (11/13/2022)   Received from [facility]   Social Network   . How would  you rate your social network (family, work, friends)?: Adequate participation with social networks  Intimate Partner Violence: Not At Risk (01/04/2023)   Received [facility]   HITS   . Over the last 12 months how often did your partner physically hurt you?: Never   . Over the last 12 months how often did your partner insult you or talk down to you?: Never   . Over the last 12 months how often did your partner threaten you with physical harm?: Never   . Over the last 12 months how often did your partner scream or curse at you?: Never    Past Surgical History:  Procedure Laterality Date  . ADENOIDECTOMY    . DILATION AND EVACUATION N/A 10/27/2016   Procedure: DILATATION AND EVACUATION;  Surgeon: Waynard Reeds, MD;  Location: [facility] BIRTHING SUITES;  Service: Gynecology;  Laterality: N/A;    Family History  Problem Relation Age of Onset  . Hyperlipidemia Mother   . Hypertension Mother   . Obesity Mother   . Arthritis Mother   . Diabetes Mother   . Varicose Veins Mother   . Alcohol abuse Father        recurring  . Heart disease Father   . Breast cancer Maternal Aunt   . Mental illness Maternal Grandmother        anxiety  . Arthritis Maternal Grandmother   . Varicose Veins Maternal Grandmother   . Arthritis Maternal Grandfather   . Cancer Maternal Grandfather        colon, diagnosed in 6's  . Diabetes Maternal Grandfather   . Heart disease Maternal Grandfather   . Kidney disease Maternal Grandfather   . Stroke Maternal Grandfather   . Heart disease Paternal Grandfather   . Cancer Paternal Grandfather        lymphoma, ? lung CA- smoker  . Alcohol abuse Paternal Grandfather   . Obesity Brother   . Alcohol abuse Paternal Uncle   . Anxiety disorder Maternal Aunt   . Depression Maternal Aunt   . Arthritis Maternal Aunt   . Arthritis Maternal Aunt     Allergies  Allergen Reactions  . Lactose Intolerance (Gi) Other (See Comments)    GI upset  . Dust Mite Extract   .  Molds & Smuts   . Pollen Extract-Tree Extract [Pollen Extract] Itching       Latest Ref Rng & Units 12/23/2021    1:51 PM 07/26/2020    6:34 AM 07/25/2020    5:21 AM  CBC  WBC 3.4 - 10.8 x10E3/uL 6.1  9.3  7.0   Hemoglobin 11.1 - 15.9 g/dL 40.9  9.6  81.1   Hematocrit 34.0 - 46.6 % 40.7  29.3  33.1   Platelets 150 - 450 x10E3/uL 245  212  205       CMP     Component Value Date/Time   NA 139 12/23/2021 1351   K 4.5 12/23/2021 1351   CL 103 12/23/2021 1351   CO2 23 12/23/2021 1351   GLUCOSE 109 (H) 12/23/2021 1351   GLUCOSE 102 (H) 07/24/2020 1933   BUN 16 12/23/2021 1351   CREATININE 0.81 12/23/2021 1351   CALCIUM 9.6 12/23/2021 1351   PROT 7.2 12/23/2021 1351   ALBUMIN 4.5 12/23/2021 1351   AST 21 12/23/2021 1351   ALT 28 12/23/2021 1351   ALKPHOS 76 12/23/2021 1351   BILITOT 0.2 12/23/2021 1351   GFR 92.84 08/12/2016 0825   EGFR 96 12/23/2021 1351   GFRNONAA >60 07/24/2020 1933     No results found.     Assessment & Plan:   1. Varicose veins of bilateral lower extremities with pain (Primary) ***   Current Outpatient Medications on File Prior to Visit  Medication Sig Dispense Refill  . aprepitant (EMEND) 40 MG capsule Take 40 mg by mouth every morning.    . Azelastine-Fluticasone 137-50 MCG/ACT SUSP Place 1 spray into both nostrils 2 (two) times daily.    . Brimonidine Tartrate (LUMIFY) 0.025 % SOLN     . Calcium Citrate-Vitamin D (CALCIUM CITRATE + PO) Take by mouth.    . cetirizine (ZYRTEC) 10 MG tablet Take 10 mg by mouth daily.    . Cholecalciferol (VITAMIN D3) 1.25 MG (50000 UT) CAPS Take 1 capsule by mouth once a week.    . doxycycline (VIBRAMYCIN) 50 MG capsule Take 50 mg by mouth daily.    Aaron Aas EPINEPHrine 0.3 mg/0.3 mL IJ SOAJ injection Inject into the muscle.    . levonorgestrel (MIRENA, 52 MG,) 20 MCG/DAY IUD Device provided by care center    . montelukast (SINGULAIR) 10 MG tablet 1 tablet Orally Once a day for 30 days    . Multiple Vitamin  (MULTIVITAMIN ADULT PO) Take 1 capsule by mouth daily.    Aaron Aas omeprazole (PRILOSEC) 40 MG capsule Take 40 mg by mouth daily.    . ondansetron (ZOFRAN-ODT) 8 MG disintegrating tablet Take 8 mg by mouth every 8 (eight) hours as needed.    . Psyllium Fiber 0.52 g CAPS Take 1 capsule by mouth 2 (two) times daily.    Aaron Aas spironolactone (ALDACTONE) 50 MG tablet Take 50 mg by mouth 2 (two) times daily.    Aaron Aas oseltamivir (TAMIFLU) 75 MG capsule Take 1 capsule (75 mg total) by mouth 2 (two) times daily. (Patient not taking: Reported on 05/03/2023) 10 capsule 0   No current facility-administered medications on file prior to visit.    There are no Patient Instructions on file for this visit. No follow-ups on file.   Tamilyn Lupien E Himmat Enberg, NP

## 2023-05-04 ENCOUNTER — Encounter (INDEPENDENT_AMBULATORY_CARE_PROVIDER_SITE_OTHER): Payer: Self-pay | Admitting: Nurse Practitioner

## 2023-05-10 ENCOUNTER — Other Ambulatory Visit (HOSPITAL_COMMUNITY)
Admission: RE | Admit: 2023-05-10 | Discharge: 2023-05-10 | Disposition: A | Discharge status: Home / Self Care | Source: Ambulatory Visit | Attending: Family Medicine | Admitting: Family Medicine

## 2023-05-10 ENCOUNTER — Ambulatory Visit: Admitting: Family Medicine

## 2023-05-10 VITALS — BP 120/74 | HR 56 | Resp 16 | Wt 203.5 lb

## 2023-05-10 DIAGNOSIS — Z124 Encounter for screening for malignant neoplasm of cervix: Secondary | ICD-10-CM | POA: Insufficient documentation

## 2023-05-10 DIAGNOSIS — B3731 Acute candidiasis of vulva and vagina: Secondary | ICD-10-CM | POA: Diagnosis not present

## 2023-05-10 NOTE — Progress Notes (Unsigned)
 Established patient visit   Patient: Haley Washington   DOB: 04/29/1985   37 y.o. Female  MRN: [id number] Visit Date: 05/10/2023  Today's healthcare provider: Mimi Alt, MD   No chief complaint on file.  Subjective       Discussed the use of AI scribe software for clinical note transcription with the patient, who gave verbal consent to proceed.  History of Present Illness Haley Washington is a 38 year old female who presents for cervical cancer screening and Mirena IUD status check.  She had blood work done in March as part of a program with a nutritionist, covered by her state health plan. The comprehensive panel showed some values slightly above or below normal, but no specific concerns were mentioned. She has not received feedback on the results.  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  as I was advised it would be a good idea to verify that it's still in-place and there are no issues; now that I'm post-gastric bypass and have lost 70 pounds.    Past Medical History:  Diagnosis Date   Acne    has been on aldactone in the past.  Also tried Accutane without improvement in her symptoms    Allergy    Annual physical exam 12/09/2022   History of chicken pox    Hx: UTI (urinary tract infection)    Hyperlipidemia    Low back pain 2017   improved with physical therapy   Morbid obesity with BMI of 40.0-44.9, adult (HCC)     Medications: Outpatient Medications Prior to Visit  Medication Sig   Azelastine-Fluticasone 137-50 MCG/ACT SUSP Place 1 spray into both nostrils 2 (two) times daily.   Brimonidine Tartrate (LUMIFY) 0.025 % SOLN    Calcium Citrate-Vitamin D (CALCIUM CITRATE + PO) Take by mouth.   cetirizine (ZYRTEC) 10 MG tablet Take 10 mg by mouth daily.   Cholecalciferol (VITAMIN D3) 1.25 MG  (50000 UT) CAPS Take 1 capsule by mouth once a week.   doxycycline  (VIBRAMYCIN ) 50 MG capsule Take 50 mg by mouth daily.   EPINEPHrine 0.3 mg/0.3 mL IJ SOAJ injection Inject into the muscle.   levonorgestrel (MIRENA, 52 MG,) 20 MCG/DAY IUD Device provided by care center   montelukast (SINGULAIR) 10 MG tablet 1 tablet Orally Once a day for 30 days   Multiple Vitamin (MULTIVITAMIN ADULT PO) Take 1 capsule by mouth daily.   omeprazole (PRILOSEC) 40 MG capsule Take 40 mg by mouth daily.   Psyllium Fiber 0.52 g CAPS Take 1 capsule by mouth 2 (two) times daily.   spironolactone (ALDACTONE) 50 MG tablet Take 50 mg by mouth 2 (two) times daily.   aprepitant (EMEND) 40 MG capsule Take 40 mg by mouth every morning.   ondansetron  (ZOFRAN -ODT) 8 MG disintegrating tablet Take 8 mg by mouth every 8 (eight) hours as needed.   oseltamivir  (TAMIFLU ) 75 MG capsule Take 1 capsule (75 mg total) by mouth 2 (two) times daily. (Patient not taking: Reported on 05/03/2023)   No facility-administered medications prior to visit.    Review of Systems  Last metabolic panel Lab Results  Component Value Date   GLUCOSE 109 (H) 12/23/2021   NA 139 12/23/2021   K 4.5 12/23/2021   CL 103 12/23/2021   CO2 23 12/23/2021   BUN 16 12/23/2021   CREATININE  0.81 12/23/2021   EGFR 96 12/23/2021   CALCIUM 9.6 12/23/2021   PROT 7.2 12/23/2021   ALBUMIN 4.5 12/23/2021   LABGLOB 2.7 12/23/2021   AGRATIO 1.7 12/23/2021   BILITOT 0.2 12/23/2021   ALKPHOS 76 12/23/2021   AST 21 12/23/2021   ALT 28 12/23/2021   ANIONGAP 10 07/24/2020   Last lipids Lab Results  Component Value Date   CHOL 186 01/29/2018   HDL 52 01/29/2018   LDLCALC 116 (H) 01/29/2018   TRIG 92 01/29/2018   CHOLHDL 3.6 01/29/2018   Last hemoglobin A1c Lab Results  Component Value Date   HGBA1C 5.7 (H) 12/23/2021   Last thyroid functions Lab Results  Component Value Date   TSH 1.220 12/23/2021        Objective    BP 120/74 (BP Location:  Right Arm, Patient Position: Sitting, Cuff Size: Normal)   Pulse (!) 56   Resp 16   Wt 203 lb 8 oz (92.3 kg)   SpO2 100%   BMI 34.93 kg/m  BP Readings from Last 3 Encounters:  05/10/23 120/74  05/03/23 106/74  04/05/23 118/74   Wt Readings from Last 3 Encounters:  05/10/23 203 lb 8 oz (92.3 kg)  05/03/23 202 lb 9.6 oz (91.9 kg)  04/05/23 212 lb (96.2 kg)        Physical Exam  Physical Exam GENITOURINARY: Cervix visualized, IUD strings visible, no abnormalities seen. No bleeding, no cervical lesions     No results found for any visits on 05/10/23.  Assessment & Plan     Problem List Items Addressed This Visit   None Visit Diagnoses       Screening for cervical cancer    -  Primary   Relevant Orders   Cytology - PAP        Assessment & Plan Cervical cancer screening Cervical cancer screening performed with Mirena IUD strings in expected position, indicating proper placement. No abnormalities detected. - Perform Pap smear for cervical cancer screening.  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    No follow-ups on file.         Mimi Alt, MD  [facility] [phone] (phone) [phone] (fax)  [facility]

## 2023-05-13 LAB — CYTOLOGY - PAP
Adequacy: ABSENT
Comment: NEGATIVE
Diagnosis: NEGATIVE
High risk HPV: NEGATIVE

## 2023-05-14 ENCOUNTER — Encounter: Payer: Self-pay | Admitting: Family Medicine

## 2023-05-14 MED ORDER — FLUCONAZOLE 150 MG PO TABS: 150.0000 mg | ORAL_TABLET | Freq: Once | ORAL | 0 refills | Status: AC

## 2023-05-14 NOTE — Addendum Note (Signed)
 Addended by: SIMMONS-ROBINSON, Simrin Vegh L on: 05/14/2023 04:09 PM   Modules accepted: Orders

## 2023-06-08 ENCOUNTER — Encounter (INDEPENDENT_AMBULATORY_CARE_PROVIDER_SITE_OTHER): Payer: Self-pay

## 2023-06-08 ENCOUNTER — Ambulatory Visit: Admitting: Family Medicine

## 2023-06-09 ENCOUNTER — Encounter: Payer: Self-pay | Admitting: Family Medicine

## 2023-06-09 NOTE — Telephone Encounter (Signed)
 Please see the message below.

## 2023-07-14 ENCOUNTER — Encounter: Payer: Self-pay | Admitting: Family Medicine

## 2023-08-26 ENCOUNTER — Ambulatory Visit (INDEPENDENT_AMBULATORY_CARE_PROVIDER_SITE_OTHER): Admitting: Vascular Surgery

## 2023-08-26 ENCOUNTER — Encounter (INDEPENDENT_AMBULATORY_CARE_PROVIDER_SITE_OTHER): Payer: Self-pay | Admitting: Vascular Surgery

## 2023-08-26 VITALS — BP 113/77 | HR 67 | Resp 18 | Wt 184.2 lb

## 2023-08-26 DIAGNOSIS — I83813 Varicose veins of bilateral lower extremities with pain: Secondary | ICD-10-CM

## 2023-08-26 NOTE — Progress Notes (Signed)
   Indication:  Patient presents with symptomatic varicose veins of the bilateral lower extremity.  Procedure:  Sclerotherapy using SDS foam mixed with 1% Lidocaine was performed on the bilateral lower extremity.  Compression wraps were placed.  The patient tolerated the procedure well.  Plan:  Follow up as needed.

## 2023-12-13 ENCOUNTER — Ambulatory Visit (INDEPENDENT_AMBULATORY_CARE_PROVIDER_SITE_OTHER): Payer: Self-pay | Admitting: Family Medicine

## 2023-12-13 ENCOUNTER — Encounter (INDEPENDENT_AMBULATORY_CARE_PROVIDER_SITE_OTHER): Payer: Self-pay | Admitting: Vascular Surgery

## 2023-12-13 ENCOUNTER — Ambulatory Visit (INDEPENDENT_AMBULATORY_CARE_PROVIDER_SITE_OTHER): Admitting: Vascular Surgery

## 2023-12-13 VITALS — BP 121/79 | HR 61 | Resp 18 | Ht 64.0 in | Wt 177.0 lb

## 2023-12-13 VITALS — BP 100/68 | HR 58 | Temp 97.5°F | Ht 64.0 in | Wt 175.1 lb

## 2023-12-13 DIAGNOSIS — I890 Lymphedema, not elsewhere classified: Secondary | ICD-10-CM | POA: Diagnosis not present

## 2023-12-13 DIAGNOSIS — Z0000 Encounter for general adult medical examination without abnormal findings: Secondary | ICD-10-CM

## 2023-12-13 DIAGNOSIS — I872 Venous insufficiency (chronic) (peripheral): Secondary | ICD-10-CM | POA: Diagnosis not present

## 2023-12-13 DIAGNOSIS — I83813 Varicose veins of bilateral lower extremities with pain: Secondary | ICD-10-CM

## 2023-12-13 NOTE — Patient Instructions (Signed)
 To keep you healthy, please keep in mind the following health maintenance items that you are due for:   Health Maintenance Due  Topic Date Due   Hepatitis C Screening  Never done   Hepatitis B Vaccines 19-59 Average Risk (1 of 3 - 19+ 3-dose series) Never done   HPV VACCINES (1 - 3-dose SCDM series) Never done     Best Wishes,   Dr. Lang

## 2023-12-13 NOTE — Progress Notes (Signed)
 Complete physical exam   Patient: Haley Washington   DOB: [birth year]   38 y.o. Female  MRN: [id number] Visit Date: 12/13/2023  Today's healthcare provider: Rockie Agent, MD   Chief Complaint  Patient presents with   Annual Exam    Patient presents for annual exam with PCP.  Diet is high protein and calorie deficit, sees a nutritionist. Exercise gets 8,000 steps daily, mostly just walking.    Subjective    Haley Washington is a 38 y.o. female who presents today for a complete physical exam.    She does not have additional problems to discuss today.   Discussed the use of AI scribe software for clinical note transcription with the patient, who gave verbal consent to proceed.  History of Present Illness Haley Washington is a 38 year old female who presents for an annual physical exam.  She has a history of chronic obesity, previously with a BMI in the class three range. She has achieved significant weight loss and her current BMI is 30. She follows a high protein diet with a calorie deficit, sees a nutritionist weekly, and exercises by walking approximately 8,000 steps daily. She experienced significant hair loss earlier this year, which has now slowed down.  She has bilateral varicose veins in the lower extremities and is under the care of a vascular specialist. She underwent vein ablation over a year ago on one leg and in March on the other leg. In August, she had sclerotherapy on one leg and is awaiting approval for further sessions. Weight loss has helped, but some varicose veins remain.  Her last lab work in June 2025 showed a white blood cell count of 2.9, hemoglobin of 11.8, MCV of 99, and platelets of 168. Her CMP was within normal ranges, vitamin D level was 45, ferritin was 88, and vitamin B1 was 132.  She experiences intermittent redness and puffiness in her right eye, which occurs overnight and resolves in a couple of days. She has a history of an eye infection  treated with eye drops and receives regular allergy shots. She does not wear contacts and has no other symptoms associated with the eye redness.     Past Medical History:  Diagnosis Date   Acne    has been on aldactone in the past.  Also tried Accutane without improvement in her symptoms    Allergy    Annual physical exam 12/09/2022   History of chicken pox    Hx: UTI (urinary tract infection)    Hyperlipidemia    Low back pain 2017   improved with physical therapy   Morbid obesity with BMI of 40.0-44.9, adult [facility])    Past Surgical History:  Procedure Laterality Date   ADENOIDECTOMY     DILATION AND EVACUATION N/A 10/27/2016   Procedure: DILATATION AND EVACUATION;  Surgeon: Okey Leader, MD;  Location: [facility];  Service: Gynecology;  Laterality: N/A;   Social History   Socioeconomic History   Marital status: Married    Spouse name: Aloha   Number of children: Not on file   Years of education: Not on file   Highest education level: Master's degree (e.g., MA, MS, MEng, MEd, MSW, MBA)  Occupational History   Not on file  Tobacco Use   Smoking status: Never    Passive exposure: Never   Smokeless tobacco: Never  Vaping Use   Vaping status: Never Used  Substance and Sexual Activity   Alcohol use: Not  Currently    Comment: rarely   Drug use: Never   Sexual activity: Yes    Partners: Male    Birth control/protection: I.U.D.    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   Social Drivers of Health   Financial Resource Strain: Low Risk  (12/13/2023)   Overall Financial Resource Strain (CARDIA)    Difficulty of Paying Living Expenses: Not hard at all  Food Insecurity: No Food Insecurity (12/13/2023)   Hunger Vital Sign    Worried About Running Out of Food in the Last Year:  Never true    Ran Out of Food in the Last Year: Never true  Transportation Needs: No Transportation Needs (12/13/2023)   PRAPARE - Administrator, Civil Service (Medical): No    Lack of Transportation (Non-Medical): No  Physical Activity: Sufficiently Active (12/13/2023)   Exercise Vital Sign    Days of Exercise per Week: 7 days    Minutes of Exercise per Session: 60 min  Stress: No Stress Concern Present (12/13/2023)   [employer] of Occupational Health - Occupational Stress Questionnaire    Feeling of Stress: Not at all  Social Connections: Socially Integrated (12/13/2023)   Social Connection and Isolation Panel    Frequency of Communication with Friends and Family: Three times a week    Frequency of Social Gatherings with Friends and Family: Once a week    Attends Religious Services: More than 4 times per year    Active Member of [employer] or Organizations: Yes    Attends Engineer, Structural: More than 4 times per year    Marital Status: Married  Catering Manager Violence: Not At Risk (07/12/2023)   Received from [facility]   HITS    Over the last 12 months how often did your partner physically hurt you?: Never    Over the last 12 months how often did your partner insult you or talk down to you?: Never    Over the last 12 months how often did your partner threaten you with physical harm?: Never    Over the last 12 months how often did your partner scream or curse at you?: Never   Family Status  Relation Name Status   Mother Deane Alive   Father Carlin Alive   [employer]  (Not Specified)   MGM Cy Alive   MGF Dempsey Alive   [facility]  Deceased   PGF Kiki Deceased   Brother [place] Alive   Bruna Higinio Mungo (Not Specified)   Mat Aunt Meg (Not Specified)   Mat Wylie Olives (Not Specified)   Mat Wylie Hoover (Not Specified)  No partnership data on file   Family History  Problem Relation Age of Onset   Hyperlipidemia Mother    Hypertension Mother    Obesity  Mother    Arthritis Mother    Diabetes Mother    Varicose Veins Mother    Alcohol abuse Father        recurring   Heart disease Father    Breast cancer Maternal Aunt    Mental illness Maternal Grandmother        anxiety   Arthritis Maternal Grandmother    Varicose Veins Maternal Grandmother    Arthritis Maternal Grandfather    Cancer  Maternal Grandfather        colon, diagnosed in 74's   Diabetes Maternal Grandfather    Heart disease Maternal Grandfather    Kidney disease Maternal Grandfather    Stroke Maternal Grandfather    Heart disease Paternal Grandfather    Cancer Paternal Grandfather        lymphoma, ? lung CA- smoker   Alcohol abuse Paternal Grandfather    Obesity Brother    Alcohol abuse Paternal Uncle    Anxiety disorder Maternal Aunt    Depression Maternal Aunt    Arthritis Maternal Aunt    Arthritis Maternal Aunt    Allergies  Allergen Reactions   Lactose Intolerance (Gi) Other (See Comments)    GI upset   Dust Mite Extract    Molds & Smuts    Pollen Extract-Tree Extract [Pollen Extract] Itching     Medications: Outpatient Medications Prior to Visit  Medication Sig   Brimonidine Tartrate (LUMIFY) 0.025 % SOLN    Calcium Citrate-Vitamin D (CALCIUM CITRATE + PO) Take by mouth.   cetirizine (ZYRTEC) 10 MG tablet Take 10 mg by mouth daily.   doxycycline  (PERIOSTAT ) 20 MG tablet Take 20 mg by mouth 2 (two) times daily.   EPINEPHrine 0.3 mg/0.3 mL IJ SOAJ injection Inject into the muscle.   levonorgestrel (MIRENA, 52 MG,) 20 MCG/DAY IUD Device provided by care center   montelukast (SINGULAIR) 10 MG tablet Take 10 mg by mouth at bedtime.   Multiple Vitamin (MULTIVITAMIN ADULT PO) Take 1 capsule by mouth daily.   Psyllium Fiber 0.52 g CAPS Take 1 capsule by mouth 2 (two) times daily.   spironolactone (ALDACTONE) 50 MG tablet Take 50 mg by mouth 2 (two) times daily.   [DISCONTINUED] aprepitant (EMEND) 40 MG capsule Take 40 mg by mouth every morning.    [DISCONTINUED] Azelastine-Fluticasone 137-50 MCG/ACT SUSP Place 1 spray into both nostrils 2 (two) times daily.   [DISCONTINUED] Cholecalciferol (VITAMIN D3) 1.25 MG (50000 UT) CAPS Take 1 capsule by mouth once a week.   [DISCONTINUED] doxycycline  (VIBRAMYCIN ) 50 MG capsule Take 50 mg by mouth daily.   [DISCONTINUED] montelukast (SINGULAIR) 10 MG tablet 1 tablet Orally Once a day for 30 days   [DISCONTINUED] omeprazole (PRILOSEC) 40 MG capsule Take 40 mg by mouth daily.   [DISCONTINUED] ondansetron  (ZOFRAN -ODT) 8 MG disintegrating tablet Take 8 mg by mouth every 8 (eight) hours as needed.   [DISCONTINUED] oseltamivir  (TAMIFLU ) 75 MG capsule Take 1 capsule (75 mg total) by mouth 2 (two) times daily. (Patient not taking: Reported on 05/03/2023)   No facility-administered medications prior to visit.    Review of Systems  Last CBC Lab Results  Component Value Date   WBC 6.1 12/23/2021   HGB 13.3 12/23/2021   HCT 40.7 12/23/2021   MCV 91 12/23/2021   MCH 29.8 12/23/2021   RDW 12.3 12/23/2021   PLT 245 12/23/2021   Last metabolic panel Lab Results  Component Value Date   GLUCOSE 109 (H) 12/23/2021   NA 139 12/23/2021   K 4.5 12/23/2021   CL 103 12/23/2021   CO2 23 12/23/2021   BUN 16 12/23/2021   CREATININE 0.81 12/23/2021   EGFR 96 12/23/2021   CALCIUM 9.6 12/23/2021   PROT 7.2 12/23/2021   ALBUMIN 4.5 12/23/2021   LABGLOB 2.7 12/23/2021   AGRATIO 1.7 12/23/2021   BILITOT 0.2 12/23/2021   ALKPHOS 76 12/23/2021   AST 21 12/23/2021   ALT 28 12/23/2021   ANIONGAP 10 07/24/2020  Last lipids Lab Results  Component Value Date   CHOL 186 01/29/2018   HDL 52 01/29/2018   LDLCALC 116 (H) 01/29/2018   TRIG 92 01/29/2018   CHOLHDL 3.6 01/29/2018   Last hemoglobin A1c Lab Results  Component Value Date   HGBA1C 5.7 (H) 12/23/2021   Last thyroid functions Lab Results  Component Value Date   TSH 1.220 12/23/2021   FREET4 1.04 12/23/2021   Last vitamin D No results found  for: 25OHVITD2, 25OHVITD3, VD25OH Last vitamin B12 and Folate No results found for: VITAMINB12, FOLATE     Objective    BP 100/68 (BP Location: Right Arm, Patient Position: Sitting, Cuff Size: Normal)   Pulse (!) 58   Temp (!) 97.5 F (36.4 C) (Oral)   Ht 5' 4 (1.626 m)   Wt 175 lb 1.6 oz (79.4 kg)   LMP 12/04/2023 (Approximate)   SpO2 100%   BMI 30.06 kg/m  BP Readings from Last 3 Encounters:  12/13/23 100/68  08/26/23 113/77  05/10/23 120/74   Wt Readings from Last 3 Encounters:  12/13/23 175 lb 1.6 oz (79.4 kg)  08/26/23 184 lb 3.2 oz (83.6 kg)  05/10/23 203 lb 8 oz (92.3 kg)        Physical Exam Vitals reviewed.  Constitutional:      General: She is not in acute distress.    Appearance: Normal appearance. She is not ill-appearing, toxic-appearing or diaphoretic.  HENT:     Head: Normocephalic and atraumatic.     Right Ear: Tympanic membrane and external ear normal. There is no impacted cerumen.     Left Ear: Tympanic membrane and external ear normal. There is no impacted cerumen.     Nose: Nose normal.     Mouth/Throat:     Pharynx: Oropharynx is clear.  Eyes:     General: No scleral icterus.    Extraocular Movements: Extraocular movements intact.     Conjunctiva/sclera: Conjunctivae normal.     Pupils: Pupils are equal, round, and reactive to light.  Cardiovascular:     Rate and Rhythm: Normal rate and regular rhythm.     Pulses: Normal pulses.     Heart sounds: Normal heart sounds. No murmur heard.    No friction rub. No gallop.  Pulmonary:     Effort: Pulmonary effort is normal. No respiratory distress.     Breath sounds: Normal breath sounds. No wheezing, rhonchi or rales.  Abdominal:     General: Bowel sounds are normal. There is no distension.     Palpations: Abdomen is soft. There is no mass.     Tenderness: There is no abdominal tenderness. There is no guarding.  Musculoskeletal:        General: No deformity.     Cervical back:  Normal range of motion and neck supple.     Right lower leg: Edema present.     Left lower leg: Edema present.  Lymphadenopathy:     Cervical: No cervical adenopathy.  Skin:    General: Skin is warm.     Capillary Refill: Capillary refill takes less than 2 seconds.     Findings: No erythema or rash.  Neurological:     General: No focal deficit present.     Mental Status: She is alert and oriented to person, place, and time.     Cranial Nerves: Cranial nerves 2-12 are intact. No cranial nerve deficit or facial asymmetry.     Motor: Motor function is intact. No weakness.  Gait: Gait normal.  Psychiatric:        Mood and Affect: Mood normal.        Behavior: Behavior normal.       Last depression screening scores    12/13/2023    8:13 AM 05/10/2023    1:47 PM 12/09/2022    3:44 PM  PHQ 2/9 Scores  PHQ - 2 Score 0 0 0  PHQ- 9 Score 2  1      Data saved with a previous flowsheet row definition    Last fall risk screening    12/13/2023    8:13 AM  Fall Risk   Falls in the past year? 0  Number falls in past yr: 0  Injury with Fall? 0  Risk for fall due to : No Fall Risks  Follow up Falls evaluation completed    Last Audit-C alcohol use screening    12/13/2023    7:42 AM  Alcohol Use Disorder Test (AUDIT)  1. How often do you have a drink containing alcohol? 1  2. How many drinks containing alcohol do you have on a typical day when you are drinking? 0  3. How often do you have six or more drinks on one occasion? 0  AUDIT-C Score 1      Patient-reported   A score of 3 or more in women, and 4 or more in men indicates increased risk for alcohol abuse, EXCEPT if all of the points are from question 1   No results found for any visits on 12/13/23.  Assessment & Plan    Routine Health Maintenance and Physical Exam  Immunization History  Administered Date(s) Administered   Influenza, Seasonal, Injecte, Preservative Fre 11/03/2022   Influenza,inj,Quad PF,6+ Mos  10/19/2016, 09/29/2017, 10/20/2021, 11/21/2021   Influenza-Unspecified 10/20/2018, 11/16/2019, 10/20/2021, 11/18/2023   MMR 12/02/2017   Moderna Covid-19 Fall Seasonal Vaccine 18yrs & older 10/11/2023   PFIZER(Purple Top)SARS-COV-2 Vaccination 03/23/2019, 04/20/2019, 10/20/2019   Pfizer Covid-19 Vaccine Bivalent Booster 4yrs & up 10/28/2020   Pfizer(Comirnaty)Fall Seasonal Vaccine 12 years and older 10/20/2021, 11/13/2022   Tdap 12/25/2016, 06/03/2020    Health Maintenance  Topic Date Due   Hepatitis C Screening  Never done   Hepatitis B Vaccines 19-59 Average Risk (1 of 3 - 19+ 3-dose series) Never done   HPV VACCINES (1 - 3-dose SCDM series) Never done   Cervical Cancer Screening (HPV/Pap Cotest)  05/09/2028   DTaP/Tdap/Td (3 - Td or Tdap) 06/04/2030   Influenza Vaccine  Completed   COVID-19 Vaccine  Completed   HIV Screening  Completed   Pneumococcal Vaccine  Aged Out   Meningococcal B Vaccine  Aged Out    Problem List Items Addressed This Visit   None   Assessment and Plan Assessment & Plan Adult Wellness Visit Annual physical examination conducted. Blood pressure is well-controlled at 100/68 mmHg. BMI has decreased to 30, indicating improvement in weight management. Labs were reviewed, and new labs are planned for follow-up with bariatric surgeon in December. - Recommended flu vaccine - Recommended hepatitis C screening lab test - Recommended hepatitis B vaccine - Recommended HPV vaccine, three doses - Ordered labs for anemia, liver and kidney function, electrolytes, cholesterol, and A1c - Coordinated lab work with bariatric follow-up in December  Obesity, status post bariatric surgery with ongoing weight management Significant weight loss achieved post-bariatric surgery. Current BMI is 30. Following a high-protein diet with a calorie deficit and engaging in regular physical activity, including 8,000 steps daily. Regular follow-up  with a nutritionist and bariatric  surgeon. - Continue high-protein diet and calorie deficit - Continue regular physical activity - Continue follow-up with nutritionist and bariatric surgeon  Bilateral varicose veins of lower extremities Bilateral varicose veins treated with vein ablation and sclerotherapy. Weight loss has contributed to improvement. Awaiting re-evaluation for additional sclerotherapy sessions. - Continue follow-up with vascular specialist for re-evaluation and potential additional sclerotherapy sessions  Anemia, unspecified Previous labs showed low white blood cell count and hemoglobin. Anemia status to be re-evaluated with upcoming labs. - Ordered labs to re-evaluate anemia status  Intermittent unilateral red eye Intermittent red eye occurring overnight, possibly related to allergies or previous eye infection. Previous treatment with eye drops was effective.  Hair loss after significant weight loss Hair loss noted after significant weight loss, now stabilized. Discussed potential benefits of biotin and other supplements for hair health. - Consider biotin supplementation - Consider vitamin E, zinc, copper, and selenium supplementation  Allergic rhinitis (receiving allergy shots) Receiving regular allergy shots with noted improvement in symptoms. - Continue regular allergy shots       Return in about 1 year (around 12/13/2024) for CPE.       Rockie Agent, MD  [facility] [phone] (phone) [phone] (fax)  [facility]

## 2023-12-21 ENCOUNTER — Telehealth (INDEPENDENT_AMBULATORY_CARE_PROVIDER_SITE_OTHER): Payer: Self-pay | Admitting: Vascular Surgery

## 2023-12-21 NOTE — Telephone Encounter (Signed)
 Pt is scheduled for 12/30/23 and 01/10/24. Only able to schedule first two scleros due to pt having traveling plans end on Dec. Pt was offered 01/17/24 for last sclero before her auth runs outs. Pt was advised this was last available date without having to wait to get new auth in 2026, pt verbalized understanding. Per GS wants pt seen once a week for bilat sclero.

## 2023-12-24 ENCOUNTER — Encounter (INDEPENDENT_AMBULATORY_CARE_PROVIDER_SITE_OTHER): Payer: Self-pay | Admitting: Vascular Surgery

## 2023-12-24 NOTE — Progress Notes (Signed)
 MRN : [id number]  Haley Washington is a 38 y.o. (06/16/1985) female who presents with chief complaint of varicose veins hurt.  History of Present Illness:   The patient returns to the office for followup status post laser ablation of the right saphenous vein on 04/05/2023 as well as the left great saphenous vein on 09/17/2022.  She has had 1 round of sclerotherapy which was successful.     The patient note some improvement in the lower extremity pain but not resolution of the symptoms. The patient notes multiple residual varicosities bilaterally which continued to hurt with dependent positions and remained tender to palpation. The patient's swelling is minimally from preoperative status. The patient continues to wear graduated compression stockings on a daily basis but these are not eliminating the pain and discomfort. The patient continues to use over-the-counter anti-inflammatory medications to treat the pain and related symptoms but this has not given the patient relief. The patient notes the pain in the lower extremities is causing problems with daily exercise, problems at work and even with household activities such as preparing meals and doing dishes.   The patient is otherwise done well and there have been no complications related to the laser procedure or interval changes in the patient's overall    Post laser ultrasound shows successful ablation of the bilateral GSV   Current Meds  Medication Sig   Brimonidine Tartrate (LUMIFY) 0.025 % SOLN    Calcium Citrate-Vitamin D (CALCIUM CITRATE + PO) Take by mouth.   cetirizine (ZYRTEC) 10 MG tablet Take 10 mg by mouth daily.   doxycycline  (PERIOSTAT ) 20 MG tablet Take 20 mg by mouth 2 (two) times daily.   EPINEPHrine 0.3 mg/0.3 mL IJ SOAJ injection Inject into the muscle.   levonorgestrel (MIRENA, 52 MG,) 20 MCG/DAY IUD Device provided by care center   montelukast (SINGULAIR) 10 MG tablet Take 10 mg by mouth at bedtime.    Multiple Vitamin (MULTIVITAMIN ADULT PO) Take 1 capsule by mouth daily.   Psyllium Fiber 0.52 g CAPS Take 1 capsule by mouth 2 (two) times daily. (Patient taking differently: Take 5 capsules by mouth 2 (two) times daily.)   spironolactone (ALDACTONE) 50 MG tablet Take 50 mg by mouth 2 (two) times daily.    Past Medical History:  Diagnosis Date   Acne    has been on aldactone in the past.  Also tried Accutane without improvement in her symptoms    Allergy    Annual physical exam 12/09/2022   History of chicken pox    Hx: UTI (urinary tract infection)    Hyperlipidemia    Low back pain 2017   improved with physical therapy   Morbid obesity with BMI of 40.0-44.9, adult [facility])     Past Surgical History:  Procedure Laterality Date   ADENOIDECTOMY     DILATION AND EVACUATION N/A 10/27/2016   Procedure: DILATATION AND EVACUATION;  Surgeon: Okey Leader, MD;  Location: [facility] [place] [facility];  Service: Gynecology;  Laterality: N/A;    Social History Social History   Tobacco Use   Smoking status: Never    Passive exposure: Never   Smokeless tobacco: Never  Vaping Use   Vaping status: Never Used  Substance Use Topics   Alcohol use: Not Currently    Comment: rarely   Drug use: Never    Family History Family History  Problem Relation Age of Onset   Hyperlipidemia Mother  Hypertension Mother    Obesity Mother    Arthritis Mother    Diabetes Mother    Varicose Veins Mother    Alcohol abuse Father        recurring   Heart disease Father    Breast cancer Maternal Aunt    Mental illness Maternal Grandmother        anxiety   Arthritis Maternal Grandmother    Varicose Veins Maternal Grandmother    Arthritis Maternal Grandfather    Cancer Maternal Grandfather        colon, diagnosed in 55's   Diabetes Maternal Grandfather    Heart disease Maternal Grandfather    Kidney disease Maternal Grandfather    Stroke Maternal Grandfather    Heart disease Paternal Grandfather     Cancer Paternal Grandfather        lymphoma, ? lung CA- smoker   Alcohol abuse Paternal Grandfather    Obesity Brother    Alcohol abuse Paternal Uncle    Anxiety disorder Maternal Aunt    Depression Maternal Aunt    Arthritis Maternal Aunt    Arthritis Maternal Aunt     Allergies  Allergen Reactions   Lactose Intolerance (Gi) Other (See Comments)    GI upset   Dust Mite Extract    Molds & Smuts    Pollen Extract-Tree Extract [Pollen Extract] Itching     REVIEW OF SYSTEMS (Negative unless checked)  Constitutional: [] Weight loss  [] Fever  [] Chills Cardiac: [] Chest pain   [] Chest pressure   [] Palpitations   [] Shortness of breath when laying flat   [] Shortness of breath with exertion. Vascular:  [] Pain in legs with walking   [x] Pain in legs with standing  [] History of DVT   [] Phlebitis   [] Swelling in legs   [x] Varicose veins   [] Non-healing ulcers Pulmonary:   [] Uses home oxygen   [] Productive cough   [] Hemoptysis   [] Wheeze  [] COPD   [] Asthma Neurologic:  [] Dizziness   [] Seizures   [] History of stroke   [] History of TIA  [] Aphasia   [] Vissual changes   [] Weakness or numbness in arm   [] Weakness or numbness in leg Musculoskeletal:   [] Joint swelling   [] Joint pain   [] Low back pain Hematologic:  [] Easy bruising  [] Easy bleeding   [] Hypercoagulable state   [] Anemic Gastrointestinal:  [] Diarrhea   [] Vomiting  [] Gastroesophageal reflux/heartburn   [] Difficulty swallowing. Genitourinary:  [] Chronic kidney disease   [] Difficult urination  [] Frequent urination   [] Blood in urine Skin:  [] Rashes   [] Ulcers  Psychological:  [] History of anxiety   []  History of major depression.  Physical Examination  Vitals:   12/13/23 1400  BP: 121/79  Pulse: 61  Resp: 18  Weight: 177 lb (80.3 kg)  Height: 5' 4 (1.626 m)   Body mass index is 30.38 kg/m. Gen: WD/WN, NAD Head: [place]/AT, No temporalis wasting.  Ear/Nose/Throat: Hearing grossly intact, nares w/o erythema or drainage, pinna without  lesions Eyes: PER, EOMI, sclera nonicteric.  Neck: Supple, no gross masses.  No JVD.  Pulmonary:  Good air movement, no audible wheezing, no use of accessory muscles.  Cardiac: RRR, precordium not hyperdynamic. Vascular:  Large varicosities present, greater than 10 mm bilateral.  Veins are tender to palpation  Mild venous stasis changes to the legs bilaterally.  Trace soft pitting edema CEAP C3sEpAsPr Vessel Right Left  Radial Palpable Palpable  Gastrointestinal: soft, non-distended. No guarding/no peritoneal signs.  Musculoskeletal: M/S 5/5 throughout.  No deformity.  Neurologic: CN 2-12 intact. Pain  and light touch intact in extremities.  Symmetrical.  Speech is fluent. Motor exam as listed above. Psychiatric: Judgment intact, Mood & affect appropriate for pt's clinical situation. Dermatologic: Venous rashes no ulcers noted.  No changes consistent with cellulitis. Lymph : No lichenification or skin changes of chronic lymphedema.  CBC Lab Results  Component Value Date   WBC 6.1 12/23/2021   HGB 13.3 12/23/2021   HCT 40.7 12/23/2021   MCV 91 12/23/2021   PLT 245 12/23/2021    BMET    Component Value Date/Time   NA 139 12/23/2021 1351   K 4.5 12/23/2021 1351   CL 103 12/23/2021 1351   CO2 23 12/23/2021 1351   GLUCOSE 109 (H) 12/23/2021 1351   GLUCOSE 102 (H) 07/24/2020 1933   BUN 16 12/23/2021 1351   CREATININE 0.81 12/23/2021 1351   CALCIUM 9.6 12/23/2021 1351   GFRNONAA >60 07/24/2020 1933   GFRAA >60 10/26/2016 1745   CrCl cannot be calculated (Patient's most recent lab result is older than the maximum 21 days allowed.).  COAG Lab Results  Component Value Date   INR 0.95 10/26/2016    Radiology No results found.   Assessment/Plan 1. Varicose veins of bilateral lower extremities with pain (Primary) Recommend:  The patient has had successful ablation of the previously incompetent saphenous venous system but still has persistent symptoms of pain and swelling  that are having a negative impact on daily life and daily activities.  Patient should continue injection sclerotherapy to treat the residual varicosities.  The risks, benefits and alternative therapies were reviewed in detail with the patient.  All questions were answered.  The patient agrees to proceed with sclerotherapy at their convenience.  The patient will continue wearing the graduated compression stockings and using the over-the-counter pain medications to treat her symptoms.      2. Chronic venous insufficiency No surgery or intervention at this point in time.   The patient is CEAP C4sEpAsPr   I have discussed with the patient venous insufficiency and why it  causes symptoms. I have discussed with the patient the chronic skin changes that accompany venous insufficiency and the long term sequela such as infection and ulceration.  Patient will begin wearing graduated compression stockings or compression wraps on a daily basis.  The patient will put the compression on first thing in the morning and removing them in the evening. The patient is instructed specifically not to sleep in the compression.    In addition, behavioral modification including several periods of elevation of the lower extremities during the day will be continued. I have demonstrated that proper elevation is a position with the ankles at heart level.  The patient is instructed to begin routine exercise, especially walking on a daily basis  The patient will be assessed for a Lymph Pump depending on the effectiveness of conservative therapy and the control of the associated lymphedema.  3. Lymphedema No surgery or intervention at this point in time.   The patient is CEAP C4sEpAsPr   I have discussed with the patient venous insufficiency and why it  causes symptoms. I have discussed with the patient the chronic skin changes that accompany venous insufficiency and the long term sequela such as infection and ulceration.   Patient will begin wearing graduated compression stockings or compression wraps on a daily basis.  The patient will put the compression on first thing in the morning and removing them in the evening. The patient is instructed specifically not to sleep in  the compression.    In addition, behavioral modification including several periods of elevation of the lower extremities during the day will be continued. I have demonstrated that proper elevation is a position with the ankles at heart level.  The patient is instructed to begin routine exercise, especially walking on a daily basis  The patient will be assessed for a Lymph Pump depending on the effectiveness of conservative therapy and the control of the associated lymphedema.    Cordella Shawl, MD  12/24/2023 10:57 AM

## 2023-12-29 NOTE — Progress Notes (Signed)
   Indication:  Patient presents with symptomatic varicose veins of the bilateral lower extremity.  Procedure:  Sclerotherapy using SDS foam mixed with 1% Lidocaine was performed on the bilateral lower extremity.  Compression wraps were placed.  The patient tolerated the procedure well.  Plan:  Follow up as needed.

## 2023-12-30 ENCOUNTER — Ambulatory Visit (INDEPENDENT_AMBULATORY_CARE_PROVIDER_SITE_OTHER): Admitting: Vascular Surgery

## 2023-12-30 ENCOUNTER — Encounter (INDEPENDENT_AMBULATORY_CARE_PROVIDER_SITE_OTHER): Payer: Self-pay | Admitting: Vascular Surgery

## 2023-12-30 VITALS — BP 115/79 | HR 70 | Resp 18 | Wt 183.0 lb

## 2023-12-30 DIAGNOSIS — I83813 Varicose veins of bilateral lower extremities with pain: Secondary | ICD-10-CM | POA: Diagnosis not present

## 2024-01-06 ENCOUNTER — Ambulatory Visit (INDEPENDENT_AMBULATORY_CARE_PROVIDER_SITE_OTHER): Admitting: Vascular Surgery

## 2024-01-06 ENCOUNTER — Encounter (INDEPENDENT_AMBULATORY_CARE_PROVIDER_SITE_OTHER): Payer: Self-pay | Admitting: Vascular Surgery

## 2024-01-06 VITALS — BP 106/71 | HR 66 | Resp 18

## 2024-01-06 DIAGNOSIS — I83813 Varicose veins of bilateral lower extremities with pain: Secondary | ICD-10-CM

## 2024-01-06 NOTE — Progress Notes (Signed)
   Indication:  Patient presents with symptomatic varicose veins of the bilateral lower extremity.  Procedure:  Sclerotherapy using STS foam mixed with 1% Lidocaine was performed on the bilateral lower extremity.  Compression wraps were placed.  The patient tolerated the procedure well.  Plan:  Follow up as needed.

## 2024-01-10 ENCOUNTER — Ambulatory Visit (INDEPENDENT_AMBULATORY_CARE_PROVIDER_SITE_OTHER): Admitting: Vascular Surgery

## 2024-01-10 ENCOUNTER — Encounter (INDEPENDENT_AMBULATORY_CARE_PROVIDER_SITE_OTHER): Payer: Self-pay | Admitting: Vascular Surgery

## 2024-01-10 VITALS — BP 119/78 | HR 71 | Resp 18 | Ht 65.0 in | Wt 178.0 lb

## 2024-01-10 DIAGNOSIS — I83813 Varicose veins of bilateral lower extremities with pain: Secondary | ICD-10-CM

## 2024-01-11 ENCOUNTER — Encounter (INDEPENDENT_AMBULATORY_CARE_PROVIDER_SITE_OTHER): Payer: Self-pay | Admitting: Vascular Surgery

## 2024-01-11 NOTE — Progress Notes (Signed)
" ° °  Indication:  Patient presents with symptomatic varicose veins of the left lower extremity.  Procedure:  Sclerotherapy using hypertonic saline mixed with 1% Lidocaine  and STS foam was performed on the left lower extremity.  Compression wraps were placed.  The patient tolerated the procedure well.  Plan:  Follow up as needed.  "

## 2024-12-18 ENCOUNTER — Encounter: Admitting: Family Medicine
